# Patient Record
Sex: Female | Born: 1935 | Race: White | Hispanic: No | Smoking: Former smoker
Health system: Southern US, Community
[De-identification: ages and names within clinical notes are randomized; demographics above are authoritative.]

## PROBLEM LIST (undated history)

## (undated) DIAGNOSIS — T8859XA Other complications of anesthesia, initial encounter: Secondary | ICD-10-CM

## (undated) DIAGNOSIS — K635 Polyp of colon: Secondary | ICD-10-CM

## (undated) DIAGNOSIS — F32A Depression, unspecified: Secondary | ICD-10-CM

## (undated) DIAGNOSIS — I2510 Atherosclerotic heart disease of native coronary artery without angina pectoris: Secondary | ICD-10-CM

## (undated) DIAGNOSIS — R42 Dizziness and giddiness: Secondary | ICD-10-CM

## (undated) DIAGNOSIS — C3490 Malignant neoplasm of unspecified part of unspecified bronchus or lung: Secondary | ICD-10-CM

## (undated) DIAGNOSIS — I739 Peripheral vascular disease, unspecified: Secondary | ICD-10-CM

## (undated) DIAGNOSIS — K5900 Constipation, unspecified: Secondary | ICD-10-CM

## (undated) DIAGNOSIS — D649 Anemia, unspecified: Secondary | ICD-10-CM

## (undated) DIAGNOSIS — I10 Essential (primary) hypertension: Secondary | ICD-10-CM

## (undated) DIAGNOSIS — E785 Hyperlipidemia, unspecified: Secondary | ICD-10-CM

## (undated) DIAGNOSIS — I499 Cardiac arrhythmia, unspecified: Secondary | ICD-10-CM

## (undated) DIAGNOSIS — E119 Type 2 diabetes mellitus without complications: Secondary | ICD-10-CM

## (undated) DIAGNOSIS — E7800 Pure hypercholesterolemia, unspecified: Secondary | ICD-10-CM

## (undated) DIAGNOSIS — M1990 Unspecified osteoarthritis, unspecified site: Secondary | ICD-10-CM

## (undated) DIAGNOSIS — C439 Malignant melanoma of skin, unspecified: Secondary | ICD-10-CM

## (undated) DIAGNOSIS — R0602 Shortness of breath: Secondary | ICD-10-CM

## (undated) DIAGNOSIS — I480 Paroxysmal atrial fibrillation: Secondary | ICD-10-CM

## (undated) DIAGNOSIS — L98469 Non-pressure chronic ulcer of face with unspecified severity: Secondary | ICD-10-CM

## (undated) DIAGNOSIS — C50919 Malignant neoplasm of unspecified site of unspecified female breast: Secondary | ICD-10-CM

## (undated) DIAGNOSIS — Z972 Presence of dental prosthetic device (complete) (partial): Secondary | ICD-10-CM

## (undated) DIAGNOSIS — Z974 Presence of external hearing-aid: Secondary | ICD-10-CM

## (undated) DIAGNOSIS — I509 Heart failure, unspecified: Secondary | ICD-10-CM

## (undated) DIAGNOSIS — K219 Gastro-esophageal reflux disease without esophagitis: Secondary | ICD-10-CM

## (undated) DIAGNOSIS — R509 Fever, unspecified: Secondary | ICD-10-CM

## (undated) DIAGNOSIS — R55 Syncope and collapse: Secondary | ICD-10-CM

## (undated) DIAGNOSIS — M21379 Foot drop, unspecified foot: Secondary | ICD-10-CM

## (undated) DIAGNOSIS — L98499 Non-pressure chronic ulcer of skin of other sites with unspecified severity: Secondary | ICD-10-CM

## (undated) DIAGNOSIS — I219 Acute myocardial infarction, unspecified: Secondary | ICD-10-CM

## (undated) HISTORY — DX: Other complications of anesthesia, initial encounter: T88.59XA

## (undated) HISTORY — DX: Peripheral vascular disease, unspecified: I73.9

## (undated) HISTORY — PX: CATARACT EXTRACTION: SUR2

## (undated) HISTORY — DX: Depression, unspecified: F32.A

## (undated) HISTORY — PX: CARDIAC SURGERY: SHX584

## (undated) HISTORY — DX: Type 2 diabetes mellitus without complications: E11.9

## (undated) HISTORY — DX: Hyperlipidemia, unspecified: E78.5

## (undated) HISTORY — DX: Dizziness and giddiness: R42

## (undated) HISTORY — DX: Polyp of colon: K63.5

## (undated) HISTORY — DX: Malignant melanoma of skin, unspecified: C43.9

## (undated) HISTORY — DX: Non-pressure chronic ulcer of face with unspecified severity: L98.469

## (undated) HISTORY — DX: Foot drop, unspecified foot: M21.379

## (undated) HISTORY — DX: Non-pressure chronic ulcer of skin of other sites with unspecified severity: L98.499

## (undated) HISTORY — DX: Anemia, unspecified: D64.9

## (undated) HISTORY — DX: Presence of external hearing-aid: Z97.4

## (undated) HISTORY — DX: Gastro-esophageal reflux disease without esophagitis: K21.9

## (undated) HISTORY — DX: Fever, unspecified: R50.9

## (undated) HISTORY — DX: Heart failure, unspecified: I50.9

## (undated) HISTORY — DX: Unspecified osteoarthritis, unspecified site: M19.90

## (undated) HISTORY — DX: Constipation, unspecified: K59.00

## (undated) HISTORY — DX: Syncope and collapse: R55

## (undated) HISTORY — DX: Essential (primary) hypertension: I10

## (undated) HISTORY — DX: Atherosclerotic heart disease of native coronary artery without angina pectoris: I25.10

## (undated) HISTORY — DX: Shortness of breath: R06.02

## (undated) HISTORY — DX: Acute myocardial infarction, unspecified: I21.9

## (undated) HISTORY — DX: Presence of dental prosthetic device (complete) (partial): Z97.2

## (undated) HISTORY — PX: BREAST BIOPSY: SHX20

## (undated) HISTORY — PX: ABDOMINAL HYSTERECTOMY: SHX81

## (undated) HISTORY — PX: BREAST CYST EXCISION: SHX579

## (undated) HISTORY — DX: Paroxysmal atrial fibrillation: I48.0

## (undated) HISTORY — PX: OTHER SURGICAL HISTORY: SHX169

## (undated) HISTORY — PX: BREAST LUMPECTOMY: SHX2

## (undated) HISTORY — DX: Cardiac arrhythmia, unspecified: I49.9

---

## 2013-03-28 DIAGNOSIS — I70209 Unspecified atherosclerosis of native arteries of extremities, unspecified extremity: Secondary | ICD-10-CM | POA: Insufficient documentation

## 2013-03-28 DIAGNOSIS — I739 Peripheral vascular disease, unspecified: Secondary | ICD-10-CM | POA: Insufficient documentation

## 2013-05-09 DIAGNOSIS — E785 Hyperlipidemia, unspecified: Secondary | ICD-10-CM | POA: Insufficient documentation

## 2013-05-09 DIAGNOSIS — E119 Type 2 diabetes mellitus without complications: Secondary | ICD-10-CM | POA: Insufficient documentation

## 2013-05-09 DIAGNOSIS — I2510 Atherosclerotic heart disease of native coronary artery without angina pectoris: Secondary | ICD-10-CM | POA: Insufficient documentation

## 2013-06-06 DIAGNOSIS — R0602 Shortness of breath: Secondary | ICD-10-CM | POA: Insufficient documentation

## 2013-07-20 DIAGNOSIS — R0989 Other specified symptoms and signs involving the circulatory and respiratory systems: Secondary | ICD-10-CM | POA: Insufficient documentation

## 2013-11-22 DIAGNOSIS — E875 Hyperkalemia: Secondary | ICD-10-CM | POA: Insufficient documentation

## 2013-11-22 DIAGNOSIS — I214 Non-ST elevation (NSTEMI) myocardial infarction: Secondary | ICD-10-CM | POA: Insufficient documentation

## 2014-11-12 DIAGNOSIS — F32A Depression, unspecified: Secondary | ICD-10-CM | POA: Insufficient documentation

## 2014-11-12 DIAGNOSIS — I509 Heart failure, unspecified: Secondary | ICD-10-CM | POA: Insufficient documentation

## 2014-11-12 DIAGNOSIS — I4891 Unspecified atrial fibrillation: Secondary | ICD-10-CM | POA: Insufficient documentation

## 2014-11-12 DIAGNOSIS — C50911 Malignant neoplasm of unspecified site of right female breast: Secondary | ICD-10-CM | POA: Insufficient documentation

## 2015-02-12 DIAGNOSIS — K219 Gastro-esophageal reflux disease without esophagitis: Secondary | ICD-10-CM | POA: Insufficient documentation

## 2015-07-28 DIAGNOSIS — Z951 Presence of aortocoronary bypass graft: Secondary | ICD-10-CM | POA: Insufficient documentation

## 2015-10-07 DIAGNOSIS — E1142 Type 2 diabetes mellitus with diabetic polyneuropathy: Secondary | ICD-10-CM | POA: Insufficient documentation

## 2015-12-05 DIAGNOSIS — L03119 Cellulitis of unspecified part of limb: Secondary | ICD-10-CM | POA: Insufficient documentation

## 2015-12-05 DIAGNOSIS — L02419 Cutaneous abscess of limb, unspecified: Secondary | ICD-10-CM | POA: Insufficient documentation

## 2016-01-17 DIAGNOSIS — S81809A Unspecified open wound, unspecified lower leg, initial encounter: Secondary | ICD-10-CM | POA: Insufficient documentation

## 2016-01-31 DIAGNOSIS — M21372 Foot drop, left foot: Secondary | ICD-10-CM | POA: Insufficient documentation

## 2016-01-31 DIAGNOSIS — S81802A Unspecified open wound, left lower leg, initial encounter: Secondary | ICD-10-CM | POA: Insufficient documentation

## 2016-04-23 DIAGNOSIS — Z95828 Presence of other vascular implants and grafts: Secondary | ICD-10-CM | POA: Insufficient documentation

## 2017-04-15 DIAGNOSIS — C4359 Malignant melanoma of other part of trunk: Secondary | ICD-10-CM | POA: Insufficient documentation

## 2017-04-29 DIAGNOSIS — R911 Solitary pulmonary nodule: Secondary | ICD-10-CM | POA: Insufficient documentation

## 2017-05-18 DIAGNOSIS — C3492 Malignant neoplasm of unspecified part of left bronchus or lung: Secondary | ICD-10-CM | POA: Insufficient documentation

## 2017-09-17 DIAGNOSIS — R5383 Other fatigue: Secondary | ICD-10-CM | POA: Insufficient documentation

## 2017-09-22 DIAGNOSIS — R5382 Chronic fatigue, unspecified: Secondary | ICD-10-CM | POA: Insufficient documentation

## 2017-10-26 DIAGNOSIS — E0590 Thyrotoxicosis, unspecified without thyrotoxic crisis or storm: Secondary | ICD-10-CM | POA: Insufficient documentation

## 2017-11-11 DIAGNOSIS — D649 Anemia, unspecified: Secondary | ICD-10-CM | POA: Insufficient documentation

## 2017-11-11 DIAGNOSIS — K921 Melena: Secondary | ICD-10-CM | POA: Insufficient documentation

## 2019-06-19 DIAGNOSIS — R948 Abnormal results of function studies of other organs and systems: Secondary | ICD-10-CM | POA: Insufficient documentation

## 2020-01-01 DIAGNOSIS — M79672 Pain in left foot: Secondary | ICD-10-CM | POA: Insufficient documentation

## 2020-02-28 ENCOUNTER — Other Ambulatory Visit: Payer: Self-pay | Admitting: Geriatric Medicine

## 2020-02-28 DIAGNOSIS — Z1231 Encounter for screening mammogram for malignant neoplasm of breast: Secondary | ICD-10-CM

## 2020-03-27 ENCOUNTER — Ambulatory Visit: Payer: Self-pay

## 2020-04-16 ENCOUNTER — Ambulatory Visit: Payer: Self-pay

## 2020-04-17 ENCOUNTER — Other Ambulatory Visit: Payer: Self-pay

## 2020-04-17 ENCOUNTER — Ambulatory Visit
Admission: RE | Admit: 2020-04-17 | Discharge: 2020-04-17 | Disposition: A | Discharge status: Home / Self Care | Payer: Medicare Other | Source: Ambulatory Visit | Attending: Geriatric Medicine | Admitting: Geriatric Medicine

## 2020-04-17 DIAGNOSIS — Z1231 Encounter for screening mammogram for malignant neoplasm of breast: Secondary | ICD-10-CM

## 2020-04-17 HISTORY — DX: Malignant neoplasm of unspecified site of unspecified female breast: C50.919

## 2020-04-17 HISTORY — DX: Malignant neoplasm of unspecified part of unspecified bronchus or lung: C34.90

## 2020-10-16 ENCOUNTER — Encounter: Payer: Self-pay | Admitting: Podiatry

## 2020-10-16 ENCOUNTER — Ambulatory Visit (INDEPENDENT_AMBULATORY_CARE_PROVIDER_SITE_OTHER): Payer: Medicare Other | Admitting: Podiatry

## 2020-10-16 ENCOUNTER — Other Ambulatory Visit: Payer: Self-pay

## 2020-10-16 DIAGNOSIS — I739 Peripheral vascular disease, unspecified: Secondary | ICD-10-CM

## 2020-10-16 DIAGNOSIS — E1142 Type 2 diabetes mellitus with diabetic polyneuropathy: Secondary | ICD-10-CM | POA: Diagnosis not present

## 2020-10-16 DIAGNOSIS — B351 Tinea unguium: Secondary | ICD-10-CM | POA: Diagnosis not present

## 2020-10-16 DIAGNOSIS — M79674 Pain in right toe(s): Secondary | ICD-10-CM

## 2020-10-16 DIAGNOSIS — M21372 Foot drop, left foot: Secondary | ICD-10-CM | POA: Diagnosis not present

## 2020-10-16 DIAGNOSIS — M79675 Pain in left toe(s): Secondary | ICD-10-CM

## 2020-10-16 DIAGNOSIS — I5032 Chronic diastolic (congestive) heart failure: Secondary | ICD-10-CM | POA: Insufficient documentation

## 2020-10-16 DIAGNOSIS — I480 Paroxysmal atrial fibrillation: Secondary | ICD-10-CM | POA: Insufficient documentation

## 2020-10-16 DIAGNOSIS — E034 Atrophy of thyroid (acquired): Secondary | ICD-10-CM | POA: Insufficient documentation

## 2020-10-16 NOTE — Progress Notes (Signed)
This patient returns to my office for at risk foot care.  This patient requires this care by a professional since this patient will be at risk due to having diabetic neuropathy, drop foot left and intermittant claudication.  This patient is unable to cut nails herself since the patient cannot reach her nails.These nails are painful walking and wearing shoes.  This patient presents for at risk foot care today.  General Appearance  Alert, conversant and in no acute stress.  Vascular  Dorsalis pedis and posterior tibial  pulses are absent  bilaterally.  Capillary return is within normal limits  bilaterally. Cold feet noted  Bilaterally.  Absent digital hair  B/L.  Neurologic  Senn-Weinstein monofilament wire test within normal limits  bilaterally. Muscle power within normal limits bilaterally.  Nails Thick disfigured discolored nails with subungual debris  from hallux to fifth toes bilaterally. No evidence of bacterial infection or drainage bilaterally.  Orthopedic  No limitations of motion  feet .  No crepitus or effusions noted.  No bony pathology or digital deformities noted.  Midfoot  DJD left.  Fixed forefoot equinus position left foot.  Hammer toe 2-5 left foot.  HAV  B/L.  Skin  normotropic skin with no porokeratosis noted bilaterally.  No signs of infections or ulcers noted.     Onychomycosis  Pain in right toes  Pain in left toes  Consent was obtained for treatment procedures.   Mechanical debridement of nails 1-5  bilaterally performed with a nail nipper.  Filed with dremel without incident.    Return office visit   4 months                  Told patient to return for periodic foot care and evaluation due to potential at risk complications.   Gardiner Barefoot DPM

## 2020-11-07 ENCOUNTER — Other Ambulatory Visit: Payer: Self-pay

## 2020-11-07 ENCOUNTER — Encounter (HOSPITAL_COMMUNITY): Payer: Self-pay

## 2020-11-07 ENCOUNTER — Observation Stay (HOSPITAL_COMMUNITY)
Admission: EM | Admit: 2020-11-07 | Discharge: 2020-11-08 | Disposition: A | Discharge status: Home / Self Care | Payer: Medicare Other | Attending: Internal Medicine | Admitting: Internal Medicine

## 2020-11-07 ENCOUNTER — Observation Stay (HOSPITAL_COMMUNITY): Payer: Medicare Other

## 2020-11-07 DIAGNOSIS — Z87891 Personal history of nicotine dependence: Secondary | ICD-10-CM | POA: Insufficient documentation

## 2020-11-07 DIAGNOSIS — I2510 Atherosclerotic heart disease of native coronary artery without angina pectoris: Secondary | ICD-10-CM | POA: Diagnosis not present

## 2020-11-07 DIAGNOSIS — E119 Type 2 diabetes mellitus without complications: Secondary | ICD-10-CM | POA: Diagnosis not present

## 2020-11-07 DIAGNOSIS — E1122 Type 2 diabetes mellitus with diabetic chronic kidney disease: Secondary | ICD-10-CM | POA: Diagnosis not present

## 2020-11-07 DIAGNOSIS — R140 Abdominal distension (gaseous): Secondary | ICD-10-CM

## 2020-11-07 DIAGNOSIS — Z79899 Other long term (current) drug therapy: Secondary | ICD-10-CM | POA: Diagnosis not present

## 2020-11-07 DIAGNOSIS — N189 Chronic kidney disease, unspecified: Secondary | ICD-10-CM | POA: Diagnosis not present

## 2020-11-07 DIAGNOSIS — Z794 Long term (current) use of insulin: Secondary | ICD-10-CM | POA: Insufficient documentation

## 2020-11-07 DIAGNOSIS — Z85118 Personal history of other malignant neoplasm of bronchus and lung: Secondary | ICD-10-CM | POA: Insufficient documentation

## 2020-11-07 DIAGNOSIS — I130 Hypertensive heart and chronic kidney disease with heart failure and stage 1 through stage 4 chronic kidney disease, or unspecified chronic kidney disease: Secondary | ICD-10-CM | POA: Insufficient documentation

## 2020-11-07 DIAGNOSIS — Z951 Presence of aortocoronary bypass graft: Secondary | ICD-10-CM | POA: Diagnosis not present

## 2020-11-07 DIAGNOSIS — I5032 Chronic diastolic (congestive) heart failure: Secondary | ICD-10-CM | POA: Diagnosis present

## 2020-11-07 DIAGNOSIS — E785 Hyperlipidemia, unspecified: Secondary | ICD-10-CM | POA: Diagnosis present

## 2020-11-07 DIAGNOSIS — R2689 Other abnormalities of gait and mobility: Secondary | ICD-10-CM | POA: Diagnosis not present

## 2020-11-07 DIAGNOSIS — Z853 Personal history of malignant neoplasm of breast: Secondary | ICD-10-CM | POA: Diagnosis not present

## 2020-11-07 DIAGNOSIS — Z20822 Contact with and (suspected) exposure to covid-19: Secondary | ICD-10-CM | POA: Insufficient documentation

## 2020-11-07 DIAGNOSIS — E875 Hyperkalemia: Secondary | ICD-10-CM | POA: Diagnosis present

## 2020-11-07 DIAGNOSIS — Z7982 Long term (current) use of aspirin: Secondary | ICD-10-CM | POA: Diagnosis not present

## 2020-11-07 DIAGNOSIS — R0602 Shortness of breath: Secondary | ICD-10-CM

## 2020-11-07 DIAGNOSIS — I480 Paroxysmal atrial fibrillation: Secondary | ICD-10-CM | POA: Diagnosis present

## 2020-11-07 DIAGNOSIS — C3492 Malignant neoplasm of unspecified part of left bronchus or lung: Secondary | ICD-10-CM | POA: Diagnosis present

## 2020-11-07 DIAGNOSIS — N1832 Chronic kidney disease, stage 3b: Secondary | ICD-10-CM

## 2020-11-07 DIAGNOSIS — E876 Hypokalemia: Secondary | ICD-10-CM

## 2020-11-07 DIAGNOSIS — R7989 Other specified abnormal findings of blood chemistry: Secondary | ICD-10-CM | POA: Diagnosis present

## 2020-11-07 HISTORY — DX: Pure hypercholesterolemia, unspecified: E78.00

## 2020-11-07 HISTORY — DX: Type 2 diabetes mellitus without complications: E11.9

## 2020-11-07 HISTORY — DX: Essential (primary) hypertension: I10

## 2020-11-07 LAB — GLUCOSE, CAPILLARY
Glucose-Capillary: 111 mg/dL — ABNORMAL HIGH (ref 70–99)
Glucose-Capillary: 206 mg/dL — ABNORMAL HIGH (ref 70–99)

## 2020-11-07 LAB — MAGNESIUM: Magnesium: 1.1 mg/dL — ABNORMAL LOW (ref 1.7–2.4)

## 2020-11-07 LAB — BASIC METABOLIC PANEL
Anion gap: 13 (ref 5–15)
Anion gap: 13 (ref 5–15)
BUN: 21 mg/dL (ref 8–23)
BUN: 19 mg/dL (ref 8–23)
CO2: 29 mmol/L (ref 22–32)
CO2: 29 mmol/L (ref 22–32)
Calcium: 6.6 mg/dL — ABNORMAL LOW (ref 8.9–10.3)
Calcium: 7.4 mg/dL — ABNORMAL LOW (ref 8.9–10.3)
Chloride: 98 mmol/L (ref 98–111)
Chloride: 100 mmol/L (ref 98–111)
Creatinine, Ser: 1.29 mg/dL — ABNORMAL HIGH (ref 0.44–1.00)
Creatinine, Ser: 1.13 mg/dL — ABNORMAL HIGH (ref 0.44–1.00)
GFR, Estimated: 41 mL/min — ABNORMAL LOW (ref >60)
GFR, Estimated: 48 mL/min — ABNORMAL LOW (ref >60)
Glucose, Bld: 134 mg/dL — ABNORMAL HIGH (ref 70–99)
Glucose, Bld: 121 mg/dL — ABNORMAL HIGH (ref 70–99)
Potassium: 3.3 mmol/L — ABNORMAL LOW (ref 3.5–5.1)
Potassium: 3.3 mmol/L — ABNORMAL LOW (ref 3.5–5.1)
Sodium: 140 mmol/L (ref 135–145)
Sodium: 142 mmol/L (ref 135–145)

## 2020-11-07 LAB — CBC
HCT: 34.1 % — ABNORMAL LOW (ref 36.0–46.0)
Hemoglobin: 10.6 g/dL — ABNORMAL LOW (ref 12.0–15.0)
MCH: 30.4 pg (ref 26.0–34.0)
MCHC: 31.1 g/dL (ref 30.0–36.0)
MCV: 97.7 fL (ref 80.0–100.0)
Platelets: 283 10*3/uL (ref 150–400)
RBC: 3.49 MIL/uL — ABNORMAL LOW (ref 3.87–5.11)
RDW: 16.2 % — ABNORMAL HIGH (ref 11.5–15.5)
WBC: 8.4 10*3/uL (ref 4.0–10.5)
nRBC: 0 % (ref 0.0–0.2)

## 2020-11-07 LAB — RETICULOCYTES
Immature Retic Fract: 27.6 % — ABNORMAL HIGH (ref 2.3–15.9)
RBC.: 3.66 MIL/uL — ABNORMAL LOW (ref 3.87–5.11)
Retic Count, Absolute: 90.4 10*3/uL (ref 19.0–186.0)
Retic Ct Pct: 2.5 % (ref 0.4–3.1)

## 2020-11-07 LAB — FERRITIN: Ferritin: 35 ng/mL (ref 11–307)

## 2020-11-07 LAB — HEPATIC FUNCTION PANEL
ALT: 18 U/L (ref 0–44)
AST: 22 U/L (ref 15–41)
Albumin: 3.6 g/dL (ref 3.5–5.0)
Alkaline Phosphatase: 107 U/L (ref 38–126)
Bilirubin, Direct: 0.1 mg/dL (ref 0.0–0.2)
Indirect Bilirubin: 0.6 mg/dL (ref 0.3–0.9)
Total Bilirubin: 0.7 mg/dL (ref 0.3–1.2)
Total Protein: 7.6 g/dL (ref 6.5–8.1)

## 2020-11-07 LAB — VITAMIN D 25 HYDROXY (VIT D DEFICIENCY, FRACTURES): Vit D, 25-Hydroxy: 13.44 ng/mL — ABNORMAL LOW (ref 30–100)

## 2020-11-07 LAB — RESP PANEL BY RT-PCR (FLU A&B, COVID) ARPGX2
Influenza A by PCR: NEGATIVE
Influenza B by PCR: NEGATIVE
SARS Coronavirus 2 by RT PCR: NEGATIVE

## 2020-11-07 LAB — IRON AND TIBC
Iron: 39 ug/dL (ref 28–170)
Saturation Ratios: 12 % (ref 10.4–31.8)
TIBC: 329 ug/dL (ref 250–450)
UIBC: 290 ug/dL

## 2020-11-07 LAB — HEMOGLOBIN A1C
Hgb A1c MFr Bld: 7.5 % — ABNORMAL HIGH (ref 4.8–5.6)
Mean Plasma Glucose: 168.55 mg/dL

## 2020-11-07 LAB — PHOSPHORUS: Phosphorus: 4 mg/dL (ref 2.5–4.6)

## 2020-11-07 LAB — VITAMIN B12: Vitamin B-12: 266 pg/mL (ref 180–914)

## 2020-11-07 MED ORDER — POTASSIUM CHLORIDE CRYS ER 20 MEQ PO TBCR
20.0000 meq | EXTENDED_RELEASE_TABLET | Freq: Once | ORAL | Status: AC
  Administered 2020-11-07: 20 meq via ORAL
  Filled 2020-11-07: qty 1

## 2020-11-07 MED ORDER — SENNA 8.6 MG PO TABS
1.0000 | ORAL_TABLET | Freq: Two times a day (BID) | ORAL | Status: DC
  Filled 2020-11-07: qty 1

## 2020-11-07 MED ORDER — ASPIRIN 81 MG PO CHEW
81.0000 mg | CHEWABLE_TABLET | Freq: Every evening | ORAL | Status: DC
  Administered 2020-11-07: 81 mg via ORAL
  Filled 2020-11-07: qty 1

## 2020-11-07 MED ORDER — MAGNESIUM SULFATE 2 GM/50ML IV SOLN
2.0000 g | Freq: Once | INTRAVENOUS | Status: AC
  Administered 2020-11-07: 2 g via INTRAVENOUS
  Filled 2020-11-07: qty 50

## 2020-11-07 MED ORDER — ACETAMINOPHEN 650 MG RE SUPP: 650.0000 mg | Freq: Four times a day (QID) | RECTAL | Status: DC | PRN

## 2020-11-07 MED ORDER — VITAMIN D 25 MCG (1000 UNIT) PO TABS
1000.0000 [IU] | ORAL_TABLET | Freq: Every day | ORAL | Status: AC
  Administered 2020-11-07 – 2020-11-08 (×2): 1000 [IU] via ORAL
  Filled 2020-11-07 (×3): qty 1

## 2020-11-07 MED ORDER — POTASSIUM CHLORIDE 10 MEQ/100ML IV SOLN
10.0000 meq | INTRAVENOUS | Status: AC
  Administered 2020-11-08 (×4): 10 meq via INTRAVENOUS
  Filled 2020-11-07 (×4): qty 100

## 2020-11-07 MED ORDER — ACETAMINOPHEN 325 MG PO TABS: 650.0000 mg | ORAL_TABLET | Freq: Four times a day (QID) | ORAL | Status: DC | PRN

## 2020-11-07 MED ORDER — METOPROLOL SUCCINATE ER 50 MG PO TB24
50.0000 mg | ORAL_TABLET | Freq: Every evening | ORAL | Status: DC
  Administered 2020-11-07: 50 mg via ORAL
  Filled 2020-11-07: qty 1

## 2020-11-07 MED ORDER — VENLAFAXINE HCL ER 150 MG PO CP24
150.0000 mg | ORAL_CAPSULE | Freq: Every day | ORAL | Status: DC
  Administered 2020-11-08: 150 mg via ORAL
  Filled 2020-11-07 (×2): qty 1

## 2020-11-07 MED ORDER — TRAZODONE HCL 50 MG PO TABS
25.0000 mg | ORAL_TABLET | Freq: Every day | ORAL | Status: DC
Start: 2020-11-07 — End: 2020-11-08
  Administered 2020-11-07: 25 mg via ORAL
  Filled 2020-11-07 (×2): qty 1

## 2020-11-07 MED ORDER — POLYETHYLENE GLYCOL 3350 17 G PO PACK: 17.0000 g | PACK | Freq: Every day | ORAL | Status: DC | PRN

## 2020-11-07 MED ORDER — AMLODIPINE BESYLATE 5 MG PO TABS
5.0000 mg | ORAL_TABLET | Freq: Every evening | ORAL | Status: DC
  Administered 2020-11-07: 5 mg via ORAL
  Filled 2020-11-07: qty 1

## 2020-11-07 MED ORDER — SODIUM CHLORIDE 0.9% FLUSH: 3.0000 mL | INTRAVENOUS | Status: DC | PRN

## 2020-11-07 MED ORDER — CALCIUM GLUCONATE-NACL 2-0.675 GM/100ML-% IV SOLN
2.0000 g | Freq: Once | INTRAVENOUS | Status: AC
  Administered 2020-11-07: 2000 mg via INTRAVENOUS
  Filled 2020-11-07: qty 100

## 2020-11-07 MED ORDER — CALCIUM GLUCONATE-NACL 1-0.675 GM/50ML-% IV SOLN
1.0000 g | Freq: Once | INTRAVENOUS | Status: AC
  Administered 2020-11-08: 1000 mg via INTRAVENOUS
  Filled 2020-11-07: qty 50

## 2020-11-07 MED ORDER — SODIUM CHLORIDE 0.9% FLUSH: 3.0000 mL | Freq: Two times a day (BID) | INTRAVENOUS | Status: DC

## 2020-11-07 MED ORDER — HYDRALAZINE HCL 50 MG PO TABS
50.0000 mg | ORAL_TABLET | Freq: Three times a day (TID) | ORAL | Status: DC
  Administered 2020-11-07 – 2020-11-08 (×2): 50 mg via ORAL
  Filled 2020-11-07 (×2): qty 1

## 2020-11-07 MED ORDER — SODIUM CHLORIDE 0.9 % IV SOLN
250.0000 mL | INTRAVENOUS | Status: DC | PRN
Start: 2020-11-07 — End: 2020-11-08

## 2020-11-07 MED ORDER — INSULIN ASPART 100 UNIT/ML ~~LOC~~ SOLN
0.0000 [IU] | SUBCUTANEOUS | Status: DC
  Administered 2020-11-08: 2 [IU] via SUBCUTANEOUS
  Administered 2020-11-08: 3 [IU] via SUBCUTANEOUS
  Administered 2020-11-08: 2 [IU] via SUBCUTANEOUS
  Administered 2020-11-08: 3 [IU] via SUBCUTANEOUS
  Filled 2020-11-07: qty 0.09

## 2020-11-07 MED ORDER — PANTOPRAZOLE SODIUM 40 MG PO TBEC
40.0000 mg | DELAYED_RELEASE_TABLET | Freq: Two times a day (BID) | ORAL | Status: DC
  Administered 2020-11-07: 40 mg via ORAL
  Filled 2020-11-07 (×2): qty 1

## 2020-11-07 MED ORDER — ENOXAPARIN SODIUM 40 MG/0.4ML ~~LOC~~ SOLN
40.0000 mg | Freq: Every day | SUBCUTANEOUS | Status: DC
  Administered 2020-11-07: 40 mg via SUBCUTANEOUS
  Filled 2020-11-07 (×2): qty 0.4

## 2020-11-07 NOTE — ED Triage Notes (Addendum)
Patient states that her K+ is low, but charge nurse informed that the patient had low Calcium levels.  Patient is from [facility]

## 2020-11-07 NOTE — ED Provider Notes (Addendum)
[facility] DEPT Provider Note   CSN: [id number] Arrival date & time: 11/07/20  1334     History Chief Complaint  Patient presents with  . Abnormal Lab    Catherine Andrews is a 86 y.o. female who presents to the emergency department at the prompting of her primary care doctor, Dr. Felipa Eth, whom she saw this morning for repeat blood work. She is directed to the emergency department via telephone with concern for critically low calcium level of 6.9 mg/dL.  Additionally has borderline hypokalemia with potassium of 3.4.  Patient is asymptomatic at this time states she is feeling well and only presented to her primary care doctor for repeat blood work.  She has history of hypocalcemia in very December 2021 with calcium of 7.9 at that time.  She denies any chest pain, shortness of breath, palpitations, but endorses right leg muscle cramps earlier today.  States she is been eating and drinking normally for her.  She states she never knew her calcium was low and has not been taking any calcium supplementation.   Patient has history of hypertension, diabetes, breast and lung cancers in the past, NSTEMI with CAD, malignant melanomas of the abdomen, GERD, depression, CHF, and CKD.   HPI     Past Medical History:  Diagnosis Date  . Breast cancer ([place])    Right Breast- 20 yrs ago  . Diabetes mellitus without complication ([place])   . High cholesterol   . Hypertension   . Lung cancer [facility])     Patient Active Problem List   Diagnosis Date Noted  . 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 Paroxysmal atrial fibrillation ([place]) 10/16/2020  . Chronic diastolic congestive heart failure ([place]) 10/16/2020  . Pain due to onychomycosis of toenails of both feet 10/16/2020  . Pain in left foot 01/01/2020  . Abnormal PET scan of colon 06/19/2019  . Anemia 11/11/2017  . Black stools 11/11/2017  . Hyperthyroidism 10/26/2017  . Chronic fatigue 09/22/2017  . Lethargy  09/17/2017  . Non-small cell cancer of left lung ([place]) 05/18/2017  . Lung nodule 04/29/2017  . Malignant melanoma of torso excluding breast ([place]) 04/15/2017  . Presence of arterial stent 04/23/2016  . Left foot drop 01/31/2016  . Wound of left leg 01/31/2016  . Non-healing wound of lower extremity 01/17/2016  . Cellulitis and abscess of leg 12/05/2015  . Diabetic polyneuropathy associated with type 2 diabetes mellitus ([place]) 10/07/2015  . 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 Gastroesophageal reflux disease without esophagitis 02/12/2015  . Atrial fibrillation with RVR ([place]) 11/12/2014  . Congestive heart failure ([place]) 11/12/2014  . Depression 11/12/2014  . Malignant neoplasm of right female breast ([place]) 11/12/2014  . Hyperkalemia 11/22/2013  . NSTEMI (non-ST elevated myocardial infarction) ([place]) 11/22/2013  . Carotid bruit 07/20/2013  . Shortness of breath 06/06/2013  . Coronary artery disease 05/09/2013  . Diabetes mellitus ([place]) 05/09/2013  . Hyperlipidemia 05/09/2013  . Atherosclerosis of native artery of extremity ([place]) 03/28/2013  . Intermittent claudication ([place]) 03/28/2013    Past Surgical History:  Procedure Laterality Date  . ABDOMINAL HYSTERECTOMY    . BREAST BIOPSY    . BREAST CYST EXCISION    . BREAST LUMPECTOMY Right    20 yrs ago  . cardiac stents    . CARDIAC SURGERY       OB History   No obstetric history on file.     History reviewed. No pertinent family history.  Social History  Tobacco Use  . Smoking status: Former Research scientist (life sciences)  . Smokeless tobacco: Never Used  Vaping Use  . Vaping Use: Never used  Substance Use Topics  . Alcohol use: Never  . Drug use: Never    Home Medications Prior to Admission medications   Medication Sig Start Date End Date Taking? Authorizing Provider  acetaminophen (TYLENOL) 325 MG tablet Take 325 mg by mouth every 6 (six) hours as needed for moderate pain. 11/15/17  Yes [provider]   amLODipine (NORVASC) 5 MG tablet Take 5 mg by mouth every evening.   Yes [provider]  aspirin 81 MG chewable tablet Chew 81 mg by mouth every evening.   Yes [provider]  atorvastatin (LIPITOR) 40 MG tablet Take 40 mg by mouth daily. 08/30/14  Yes [provider]  furosemide (LASIX) 20 MG tablet Take 20 mg by mouth daily. 08/29/20  Yes [provider]  hydrALAZINE (APRESOLINE) 50 MG tablet Take 50 mg by mouth 3 (three) times daily. 09/12/20  Yes [provider]  metoprolol succinate (TOPROL-XL) 50 MG 24 hr tablet Take 50 mg by mouth every evening. 09/05/20  Yes [provider]  Multiple Vitamins-Minerals (PRESERVISION AREDS) CAPS Take 1 capsule by mouth in the morning and at bedtime.   Yes [provider]  NOVOLOG FLEXPEN 100 UNIT/ML FlexPen Inject 18-30 Units into the skin 3 (three) times daily with meals. Blood Sugar >199=18 units, >200-250=22 units, >250-300=26 units, >300=30 units 09/19/20  Yes [provider]  pantoprazole (PROTONIX) 40 MG tablet Take 40 mg by mouth 2 (two) times daily. 09/05/20  Yes [provider]  traZODone (DESYREL) 50 MG tablet Take 25 mg by mouth at bedtime. 06/06/19  Yes [provider]  TRESIBA FLEXTOUCH 100 UNIT/ML FlexTouch Pen Inject 48 Units into the skin at bedtime. 09/19/20  Yes [provider]  venlafaxine XR (EFFEXOR-XR) 150 MG 24 hr capsule Take 150 mg by mouth daily. 10/10/20  Yes [provider]  glucose blood (ACCU-CHEK AVIVA PLUS) test strip 1 strip by Miscellaneous route 3 (three) times a day before meals. 03/02/18   [provider]    Allergies    Codeine and Metformin hcl  Review of Systems   Review of Systems  Constitutional: Negative.   HENT: Negative.   Respiratory: Negative.   Cardiovascular: Negative.   Gastrointestinal: Negative.   Genitourinary: Negative.   Musculoskeletal: Negative.        Leg cramps  Skin: Negative.    Neurological: Negative.     Physical Exam Updated Vital Signs BP (!) 171/66   Pulse 79   Temp 98.2 F (36.8 C) (Oral)   Resp (!) 21   Ht 5\' 5"  (1.651 m)   Wt 79.4 kg   SpO2 (!) 89%   BMI 29.12 kg/m   Physical Exam Vitals and nursing note reviewed.  HENT:     Head: Normocephalic and atraumatic.     Nose: Nose normal.     Mouth/Throat:     Mouth: Mucous membranes are moist.     Pharynx: No oropharyngeal exudate or posterior oropharyngeal erythema.  Eyes:     General:        Right eye: No discharge.        Left eye: No discharge.     Extraocular Movements: Extraocular movements intact.     Conjunctiva/sclera: Conjunctivae normal.     Pupils: Pupils are equal, round, and reactive to light.  Cardiovascular:     Rate and Rhythm:  Normal rate and regular rhythm.     Pulses: Normal pulses.     Heart sounds: Normal heart sounds. No murmur heard.   Pulmonary:     Effort: Pulmonary effort is normal. No respiratory distress.     Breath sounds: Normal breath sounds. No wheezing or rales.  Chest:     Chest wall: No deformity, swelling, tenderness, crepitus or edema.  Abdominal:     General: Bowel sounds are normal. There is no distension.     Palpations: Abdomen is soft.     Tenderness: There is no abdominal tenderness. There is no right CVA tenderness, left CVA tenderness, guarding or rebound.  Musculoskeletal:        General: No deformity.     Cervical back: Neck supple.     Right lower leg: No edema.     Left lower leg: No edema.       Legs:  Skin:    General: Skin is warm and dry.     Capillary Refill: Capillary refill takes less than 2 seconds.  Neurological:     General: No focal deficit present.     Mental Status: She is alert and oriented to person, place, and time. Mental status is at baseline.     Cranial Nerves: Cranial nerves are intact.     Sensory: Sensation is intact.     Motor: Motor function is intact.  Psychiatric:        Mood and Affect: Mood  normal.     ED Results / Procedures / Treatments   Labs (all labs ordered are listed, but only abnormal results are displayed) Labs Reviewed  CBC - Abnormal; Notable for the following components:      Result Value   RBC 3.49 (*)    Hemoglobin 10.6 (*)    HCT 34.1 (*)    RDW 16.2 (*)    All other components within normal limits  BASIC METABOLIC PANEL - Abnormal; Notable for the following components:   Potassium 3.3 (*)    Glucose, Bld 134 (*)    Creatinine, Ser 1.29 (*)    Calcium 6.6 (*)    GFR, Estimated 41 (*)    All other components within normal limits  MAGNESIUM - Abnormal; Notable for the following components:   Magnesium 1.1 (*)    All other components within normal limits  VITAMIN D 25 HYDROXY (VIT D DEFICIENCY, FRACTURES) - Abnormal; Notable for the following components:   Vit D, 25-Hydroxy 13.44 (*)    All other components within normal limits  HEPATIC FUNCTION PANEL    EKG EKG Interpretation  Date/Time:  Thursday November 07 2020 15:58:58 EST Ventricular Rate:  80 PR Interval:    QRS Duration: 93 QT Interval:  447 QTC Calculation: 516 R Axis:   -17 Text Interpretation: Sinus rhythm Atrial premature complex Inferior infarct, old Prolonged QT interval NSR, QTc 516 Confirmed by Lavenia Atlas [phone]) on 11/07/2020 4:05:13 PM   Radiology No results found.  Procedures Procedures  Medications Ordered in ED Medications  calcium gluconate 2 g/ 100 mL sodium chloride IVPB (2,000 mg Intravenous New Bag/Given 11/07/20 1728)  cholecalciferol (VITAMIN D3) tablet 1,000 Units (has no administration in time range)  potassium chloride SA (KLOR-CON) CR tablet 20 mEq (has no administration in time range)  magnesium sulfate IVPB 2 g 50 mL (0 g Intravenous Stopped 11/07/20 1725)    ED Course  I have reviewed the triage vital signs and the nursing notes.  Pertinent labs & imaging  results that were available during my care of the patient were reviewed by me and considered in  my medical decision making (see chart for details).  Clinical Course as of 11/07/20 1811  Thu Nov 07, 2020  1740 Consult placed to cardiologist, Dr. Harrell Gave, who recommends continue repletion of her electrolytes.  Given no current arrhythmia and no cardiac symptoms, no other interventions required at this time.  She is agreeable to medicine admission.  Appreciate her collaboration in the care of this patient. [RS]    Clinical Course User Index [RS] Brailynn Breth, Sharlene Dory   MDM Rules/Calculators/A&P                          86 year old female presents with concern for low calcium of 6.9 noted at her primary care doctor.  She endorses right lower leg cramping today.  Hypertensive on intake.  Vital signs otherwise normal.  Cardiopulmonary exam is normal, abdominal exam is benign.  Patient is neurovascular intact in all 4 extremities.  Will proceed with laboratory studies and EKG.  EKG with prolonged QTC of 516, no STEMI.  CBC with anemia, hemoglobin 10.6, previously 11.  BMP with hypocalcemia of 6.6, hypokalemia of 3.3, slight AKI with creatinine 1.29, slightly higher than patient's baseline of 1.07.  Albumin normal, hypomagnesemic 1.1.  IV magnesium and calcium repletion ordered, oral potassium repletion ordered.  Consult placed to cardiology as above.  Given prolonged QTC on EKG as well as significant electrolyte derangement, feels patient would benefit from admission to the hospital this time.  Consult placed to hospitalist who requested adding on a phosphorus level.  She is agreeable to seeing this patient and admitting her to her service.  Appreciate her collaboration in the care of this patient.  Catherine Andrews voiced understanding of her medical evaluation and treatment plan.  Each of her questions was answered to her expressed satisfaction.  She is amenable to plan for admission at this time.  Final Clinical Impression(s) / ED Diagnoses Final diagnoses:  None    Rx / DC Orders ED  Discharge Orders    None       Emeline Darling, PA-C 11/07/20 1811    Matika Bartell, Gypsy Balsam, PA-C 11/07/20 1818    Lorelle Gibbs, DO 11/07/20 2310

## 2020-11-07 NOTE — ED Notes (Signed)
Patient transported to X-ray 

## 2020-11-07 NOTE — ED Notes (Signed)
Report called to Renita, RN on 6E

## 2020-11-07 NOTE — H&P (Addendum)
Catherine Andrews FAO:[id number] DOB: 04/24/1935 DOA: 11/07/2020     PCP: [name], MD   Outpatient Specialists:   CARDS:  Dr.Zidar in [place]    Patient arrived to ER on 11/07/20 at 1334 Referred by Attending Horton, Alvin Critchley, DO   Patient coming from:   From facility [facility]  Chief Complaint:   Chief Complaint  Patient presents with  . Abnormal Lab    HPI: Catherine Andrews is a 86 y.o. female with medical history significant of HTNl, CKD, non-small cell Lung Ca, Melanoma sp resection 2021, DM2, A.fib on ASA, CKD 3b, HLD, PAD, chronic diastolic CHF, CAD sp CABG    Presented with   Abnormal labs , she was told today her ca and amg was low and was to ld to go toER Reports no prior issues  Have been taking her medications Occasional constipation   Infectious risk factors:  Reports shortness of breath    Has   been vaccinated against COVID and boosted   Initial COVID TEST   in house  PCR testing  Pending  No results found for: SARSCOV2NAA   Regarding pertinent Chronic problems:   Lung CA - left upper lobe lung wedge resection on 04/26/17  S/p 5 cycles of Keytruda, last on 09/2017. 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 CT scan done on 06/13/2020 showed stable CT scan with some broad-based nodular density in the subpleural aspect of the right lower lobe unchanged  evidence of multiple pleural-based lesions,biopsy-proven adenocarcinoma noncontiguous with the original tumor. She was notfelt to be a chemotherapy or radiation candidate Her last PET scan was done on 11/21/19 revealing no evidence for hypermetabolic tumor recurrence or metastatic disease, and interval resolution of focal hypermetabolism previously localized to the proximal right colon. CT abd/pelvis in Oct '21 found no change when compared to PET from March '21.  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 Scar negative. No clinical evidence of local regional recurrence no systemic symptoms    Hyperlipidemia -  on statins lipitor Lipid Panel  No results found for: CHOL, TRIG, HDL, CHOLHDL, VLDL, LDLCALC, LDLDIRECT, LABVLDL   HTN on hydralazine, toprol    chronic CHF diastolic - Last echo noted normal LV systolic function, no regional wall motion abnormalities, EF 55-60%   on lasix   CAD  - On Aspirin, statin, betablocker,                  -  followed by cardiology                - CABG redo 2016  PAD - s/p left fem-pop bypass and prior LE stenting,    DM 2 - No results found for: HGBA1C   on insulin, PO meds only, diet controlled         A. Fib -  - CHA2DS2 vas score  7  - Off Eliquis due to recurrent falls.  AF with RVR during a hospitalization in March 2019, and was anemic from an acute GI bleed. The AF was thought to be related to dehydration.    Not on anticoagulation (isolated event)        -  Rate control:  Currently controlled with  Toprolol,      CKD stage IIIb- baseline Cr 1.2 Estimated Creatinine Clearance: 34.4  mL/min (A) (by C-G formula based on SCr of 1.29 mg/dL (H)).  Lab Results  Component Value Date   CREATININE 1.29 (H) 11/07/2020     Chronic anemia - baseline hg Hemoglobin & Hematocrit  Recent Labs    11/07/20 1432  HGB 10.6*    While in ER: hypocalcemia of 6.6, hypokalemia of 3.3, Mag 1.1   Low vit D ECG with prolonged QTC Patient was given IV magnesium and calcium as well as potassium ECG: Ordered Personally reviewed by me showing: HR : 80 Rhythm:  NSR,w PAC   no evidence of ischemic changes QTC 515   ER Provider Called:    Cardiology  Dr. Harrell Gave,  They Recommend admit to medicine replace electrolytes,  Will see in AM if needed  Hospitalist was called for admission for hypokalemia hypocalcemia hypomagnesemia  The following Work up has been ordered so far:  Orders Placed This  Encounter  Procedures  . Resp Panel by RT-PCR (Flu A&B, Covid) Nasopharyngeal Swab  . CBC  . Basic metabolic panel  . Magnesium  . VITAMIN D 25 Hydroxy (Vit-D Deficiency, Fractures)  . Hepatic function panel  . Phosphorus  . Cardiac monitoring  . Inpatient consult to Cardiology  . Consult to hospitalist  . Consult to Transition of Care  . Airborne and Contact precautions  . ED EKG      Following Medications were ordered in ER: Medications  cholecalciferol (VITAMIN D3) tablet 1,000 Units (1,000 Units Oral Given 11/07/20 1830)  magnesium sulfate IVPB 2 g 50 mL (0 g Intravenous Stopped 11/07/20 1725)  calcium gluconate 2 g/ 100 mL sodium chloride IVPB (0 g Intravenous Stopped 11/07/20 1828)  potassium chloride SA (KLOR-CON) CR tablet 20 mEq (20 mEq Oral Given 11/07/20 1830)        Consult Orders  (From admission, onward)         Start     Ordered   11/07/20 1735  Consult to hospitalist  Once       Provider:  (Not yet assigned)  Question Answer Comment  Place call to: [facility]   Reason for Consult Admit      11/07/20 1734           Significant initial  Findings: Abnormal Labs Reviewed  CBC - Abnormal; Notable for the following components:      Result Value   RBC 3.49 (*)    Hemoglobin 10.6 (*)    HCT 34.1 (*)    RDW 16.2 (*)    All other components within normal limits  BASIC METABOLIC PANEL - Abnormal; Notable for the following components:   Potassium 3.3 (*)    Glucose, Bld 134 (*)    Creatinine, Ser 1.29 (*)    Calcium 6.6 (*)    GFR, Estimated 41 (*)    All other components within normal limits  MAGNESIUM - Abnormal; Notable for the following components:   Magnesium 1.1 (*)    All other components within normal limits  VITAMIN D 25 HYDROXY (VIT D DEFICIENCY, FRACTURES) - Abnormal; Notable for the following components:   Vit D, 25-Hydroxy 13.44 (*)    All other components within normal limits    Otherwise labs showing:    Recent Labs  Lab  11/07/20 1432  NA 140  K 3.3*  CO2 29  GLUCOSE 134*  BUN 21  CREATININE 1.29*  CALCIUM 6.6*  MG 1.1*    Cr  Stable,  Lab Results  Component Value Date   CREATININE  1.29 (H) 11/07/2020    Recent Labs  Lab 11/07/20 1432  AST 22  ALT 18  ALKPHOS 107  BILITOT 0.7  PROT 7.6  ALBUMIN 3.6   Lab Results  Component Value Date   CALCIUM 6.6 (L) 11/07/2020        WBC      Component Value Date/Time   WBC 8.4 11/07/2020 1432     Plt: Lab Results  Component Value Date   PLT 283 11/07/2020     HG/HCT     Component Value Date/Time   HGB 10.6 (L) 11/07/2020 1432   HCT 34.1 (L) 11/07/2020 1432   MCV 97.7 11/07/2020 1432        UA   ordered      Ordered  KUB showing constipation  CXR - NON acute    ED Triage Vitals  Enc Vitals Group     BP 11/07/20 1341 (!) 150/66     Pulse Rate 11/07/20 1341 87     Resp 11/07/20 1341 18     Temp 11/07/20 1341 98.7 F (37.1 C)     Temp Source 11/07/20 1341 Oral     SpO2 11/07/20 1341 94 %     Weight 11/07/20 1342 175 lb (79.4 kg)     Height 11/07/20 1342 5\' 5"  (1.651 m)     Head Circumference --      Peak Flow --      Pain Score 11/07/20 1341 0     Pain Loc --      Pain Edu? --      Excl. in [place]? --   TMAX(24)@       Latest  Blood pressure (!) 156/77, pulse 83, temperature 98.2 F (36.8 C), temperature source Oral, resp. rate 19, height 5\' 5"  (1.651 m), weight 79.4 kg, SpO2 94 %.      Review of Systems:    Pertinent positives include: Constipation occasional shortness of breath dyspnea on exertion, Constitutional:  No weight loss, night sweats, Fevers, chills, fatigue, weight loss  HEENT:  No headaches, Difficulty swallowing,Tooth/dental problems,Sore throat,  No sneezing, itching, ear ache, nasal congestion, post nasal drip,  Cardio-vascular:  No chest pain, Orthopnea, PND, anasarca, dizziness, palpitations.no Bilateral lower extremity swelling  GI:  No heartburn, indigestion, abdominal pain, nausea,  vomiting, diarrhea, change in bowel habits, loss of appetite, melena, blood in stool, hematemesis Resp:   No  No excess mucus, no productive cough, No non-productive cough, No coughing up of blood.No change in color of mucus.No wheezing. Skin:  no rash or lesions. No jaundice GU:  no dysuria, change in color of urine, no urgency or frequency. No straining to urinate.  No flank pain.  Musculoskeletal:  No joint pain or no joint swelling. No decreased range of motion. No back pain.  Psych:  No change in mood or affect. No depression or anxiety. No memory loss.  Neuro: no localizing neurological complaints, no tingling, no weakness, no double vision, no gait abnormality, no slurred speech, no confusion  All systems reviewed and apart from [place] all are negative  Past Medical History:   Past Medical History:  Diagnosis Date  . Breast cancer ([place])    Right Breast- 20 yrs ago  . Diabetes mellitus without complication ([place])   . High cholesterol   . Hypertension   . Lung cancer [facility])       Past Surgical History:  Procedure Laterality Date  . ABDOMINAL HYSTERECTOMY    . BREAST BIOPSY    .  BREAST CYST EXCISION    . BREAST LUMPECTOMY Right    20 yrs ago  . cardiac stents    . CARDIAC SURGERY      Social History:  Ambulatory  wheelchair bound      reports that she has quit smoking. She has never used smokeless tobacco. She reports that she does not drink alcohol and does not use drugs.     Family History:   Family History  Problem Relation Age of Onset  . Hypertension Other     Allergies: Allergies  Allergen Reactions  . Codeine Other (See Comments)    Other reaction(s): HALLUCINATIONS Other reaction(s): HALLUCINATIONS   . Metformin Hcl     Other reaction(s): diarrhea     Prior to Admission medications   Medication Sig Start Date End Date Taking? Authorizing Provider  acetaminophen (TYLENOL) 325 MG tablet Take 325 mg by mouth every 6 (six) hours as needed for  moderate pain. 11/15/17  Yes [provider]  amLODipine (NORVASC) 5 MG tablet Take 5 mg by mouth every evening.   Yes [provider]  aspirin 81 MG chewable tablet Chew 81 mg by mouth every evening.   Yes [provider]  atorvastatin (LIPITOR) 40 MG tablet Take 40 mg by mouth daily. 08/30/14  Yes [provider]  furosemide (LASIX) 20 MG tablet Take 20 mg by mouth daily. 08/29/20  Yes [provider]  hydrALAZINE (APRESOLINE) 50 MG tablet Take 50 mg by mouth 3 (three) times daily. 09/12/20  Yes [provider]  metoprolol succinate (TOPROL-XL) 50 MG 24 hr tablet Take 50 mg by mouth every evening. 09/05/20  Yes [provider]  Multiple Vitamins-Minerals (PRESERVISION AREDS) CAPS Take 1 capsule by mouth in the morning and at bedtime.   Yes [provider]  NOVOLOG FLEXPEN 100 UNIT/ML FlexPen Inject 18-30 Units into the skin 3 (three) times daily with meals. Blood Sugar >199=18 units, >200-250=22 units, >250-300=26 units, >300=30 units 09/19/20  Yes [provider]  pantoprazole (PROTONIX) 40 MG tablet Take 40 mg by mouth 2 (two) times daily. 09/05/20  Yes [provider]  traZODone (DESYREL) 50 MG tablet Take 25 mg by mouth at bedtime. 06/06/19  Yes [provider]  TRESIBA FLEXTOUCH 100 UNIT/ML FlexTouch Pen Inject 48 Units into the skin at bedtime. 09/19/20  Yes [provider]  venlafaxine XR (EFFEXOR-XR) 150 MG 24 hr capsule Take 150 mg by mouth daily. 10/10/20  Yes [provider]  glucose blood (ACCU-CHEK AVIVA PLUS) test strip 1 strip by Miscellaneous route 3 (three) times a day before meals. 03/02/18   [provider]   Physical Exam: Vitals with BMI 11/07/2020 11/07/2020 11/07/2020  Height - - -  Weight - - -  BMI - - -  Systolic - 938 101  Diastolic - 77 66  Pulse 83 53 79     1. General:  in No  Acute distress   Chronically ill  -appearing 2. Psychological: Alert and    Oriented 3. Head/ENT:     Dry Mucous Membranes                          Head Non traumatic, neck supple                           Poor Dentition 4. SKIN:   decreased Skin turgor,  Skin clean Dry and intact no rash  5. Heart: Regular rate and rhythm no  Murmur, no Rub or gallop 6. Lungs:  no wheezes or crackles   7. Abdomen: Soft, non-tender,   distended  obese  bowel sounds present 8. Lower extremities: no clubbing, cyanosis, no  edema 9. Neurologically Grossly intact, moving all 4 extremities equally  Except for left foot drop chronic 10. MSK: Normal range of motion   All other LABS:     Recent Labs  Lab 11/07/20 1432  WBC 8.4  HGB 10.6*  HCT 34.1*  MCV 97.7  PLT 283     Recent Labs  Lab 11/07/20 1432  NA 140  K 3.3*  CL 98  CO2 29  GLUCOSE 134*  BUN 21  CREATININE 1.29*  CALCIUM 6.6*  MG 1.1*     Recent Labs  Lab 11/07/20 1432  AST 22  ALT 18  ALKPHOS 107  BILITOT 0.7  PROT 7.6  ALBUMIN 3.6       Cultures: No results found for: SDES, SPECREQUEST, CULT, REPTSTATUS   Radiological Exams on Admission: DG Chest 2 View  Result Date: 11/07/2020 CLINICAL DATA:  Abdominal distension and shortness of breath. EXAM: CHEST - 2 VIEW COMPARISON:  None. FINDINGS: A right-sided venous Port-A-Cath is seen with its distal tip noted at the junction of the superior vena cava and right atrium. Multiple sternal wires are noted. The cardiac silhouette is mildly enlarged. A calcified cyst is noted within the right breast. Degenerative changes are seen throughout the thoracic spine a chronic compression fracture deformity at the level of T12. IMPRESSION: Evidence of prior median sternotomy, without active cardiopulmonary disease. Electronically Signed   By: Virgina Norfolk M.D.   On: 11/07/2020 20:05   DG Abd 1 View  Result Date: 11/07/2020 CLINICAL DATA:  Abdominal distension EXAM: ABDOMEN - 1 VIEW COMPARISON:  None. FINDINGS: The bowel gas pattern is normal. There is a  moderate amount of colonic stool present throughout. Surgical clips are seen within the right upper quadrant. No radio-opaque calculi or other significant radiographic abnormality are seen. IMPRESSION: Moderate amount of colonic stool.  No evidence of obstruction. Electronically Signed   By: Prudencio Pair M.D.   On: 11/07/2020 20:05    Chart has been reviewed    Assessment/Plan 86 y.o. female with medical history significant of HTNl, CKD, non-small cell Lung Ca, Melanoma sp resection 2021, DM2, A.fib on ASA, CKD 3b, HLD, PAD, chronic diastolic CHF, CAD sp CABG Admitted for multiple electrolyte abnormalities  Present on Admission: . Hypocalcemia differential includes hypomagnesemia versus vitamin D deficiency.  Replace initiate vitamin D will need to continue to have this followed up and replace as needed Monitor on telemetry  . Paroxysmal atrial fibrillation (HCC) -not on anticoagulation continue Toprol.  . Non-small cell cancer of left lung ([place]) -Per records have been stable in the past Chest x-ray appears to be stable  . Hyperlipidemia -chronic stable continue Lipitor  . Hypokalemia -replace and replace magnesium level  . Coronary artery disease chronic stable continue statin aspirin and beta-blocker  . Chronic diastolic congestive heart failure (HCC)-given electrolyte abnormalities hold off on Lasix for tonight resume when able probably will need to be on some potassium supplementation as an outpatient  Constipation -will order a bowel regimen  Dm2-  - Order Sensitive SSI     -  check TSH and HgA1C  - Hold by mouth medications   Anemia -obtain anemia panel  Other plan as per orders.  DVT prophylaxis:   Lovenox  Code Status:    Code Status: Not on file  DNR/DNI  as per patient   I had personally discussed CODE STATUS with patient     Family Communication:   Family not at  Bedside    Disposition Plan:    To home once workup is complete and patient is stable    Following barriers for discharge:                            Electrolytes corrected                       Would benefit from PT/OT eval prior to DC  Ordered                                      Consults called: none  Admission status:  ED Disposition    ED Disposition Condition [place]: [place] [[id number]]  Level of Care: Telemetry [5]  Admit to tele based on following criteria: Monitor QTC interval  Covid Evaluation: Asymptomatic Screening Protocol (No Symptoms)  Diagnosis: Hypocalcemia [275.41.ICD-9-CM]  Admitting Physician: Toy Baker [[id number]]  Attending Physician: Toy Baker [[id number]]        Obs    Level of care     tele  For   24H      Lab Results  Component Value Date   [place] NEGATIVE 11/07/2020     Precautions: admitted as  Covid Negative     PPE: Used by the provider:   N95  eye Goggles,  Gloves    Sheridyn Canino 11/07/2020, 8:27 PM    [facility]     after 2 AM please page floor coverage PA If 7AM-7PM, please contact the day team taking care of the patient using [email or web address]   Patient was evaluated in the context of the global COVID-19 pandemic, which necessitated consideration that the patient might be at risk for infection with the SARS-CoV-2 virus that causes COVID-19. Institutional protocols and algorithms that pertain to the evaluation of patients at risk for COVID-19 are in a state of rapid change based on information released by regulatory bodies including the CDC and federal and state organizations. These policies and algorithms were followed during the patient's care.

## 2020-11-08 DIAGNOSIS — I2510 Atherosclerotic heart disease of native coronary artery without angina pectoris: Secondary | ICD-10-CM | POA: Diagnosis not present

## 2020-11-08 DIAGNOSIS — D519 Vitamin B12 deficiency anemia, unspecified: Secondary | ICD-10-CM

## 2020-11-08 DIAGNOSIS — Z20822 Contact with and (suspected) exposure to covid-19: Secondary | ICD-10-CM | POA: Diagnosis not present

## 2020-11-08 DIAGNOSIS — I480 Paroxysmal atrial fibrillation: Secondary | ICD-10-CM | POA: Diagnosis not present

## 2020-11-08 DIAGNOSIS — D509 Iron deficiency anemia, unspecified: Secondary | ICD-10-CM

## 2020-11-08 DIAGNOSIS — E876 Hypokalemia: Secondary | ICD-10-CM | POA: Diagnosis not present

## 2020-11-08 LAB — BASIC METABOLIC PANEL
Anion gap: 9 (ref 5–15)
BUN: 19 mg/dL (ref 8–23)
CO2: 28 mmol/L (ref 22–32)
Calcium: 7.5 mg/dL — ABNORMAL LOW (ref 8.9–10.3)
Chloride: 103 mmol/L (ref 98–111)
Creatinine, Ser: 1.09 mg/dL — ABNORMAL HIGH (ref 0.44–1.00)
GFR, Estimated: 50 mL/min — ABNORMAL LOW (ref >60)
Glucose, Bld: 242 mg/dL — ABNORMAL HIGH (ref 70–99)
Potassium: 4.5 mmol/L (ref 3.5–5.1)
Sodium: 140 mmol/L (ref 135–145)

## 2020-11-08 LAB — COMPREHENSIVE METABOLIC PANEL
ALT: 15 U/L (ref 0–44)
AST: 18 U/L (ref 15–41)
Albumin: 3.3 g/dL — ABNORMAL LOW (ref 3.5–5.0)
Alkaline Phosphatase: 87 U/L (ref 38–126)
Anion gap: 10 (ref 5–15)
BUN: 20 mg/dL (ref 8–23)
CO2: 28 mmol/L (ref 22–32)
Calcium: 7.4 mg/dL — ABNORMAL LOW (ref 8.9–10.3)
Chloride: 102 mmol/L (ref 98–111)
Creatinine, Ser: 1.05 mg/dL — ABNORMAL HIGH (ref 0.44–1.00)
GFR, Estimated: 53 mL/min — ABNORMAL LOW (ref >60)
Glucose, Bld: 191 mg/dL — ABNORMAL HIGH (ref 70–99)
Potassium: 4 mmol/L (ref 3.5–5.1)
Sodium: 140 mmol/L (ref 135–145)
Total Bilirubin: 0.6 mg/dL (ref 0.3–1.2)
Total Protein: 7 g/dL (ref 6.5–8.1)

## 2020-11-08 LAB — CBC WITH DIFFERENTIAL/PLATELET
Abs Immature Granulocytes: 0.06 10*3/uL (ref 0.00–0.07)
Basophils Absolute: 0.1 10*3/uL (ref 0.0–0.1)
Basophils Relative: 1 %
Eosinophils Absolute: 0.2 10*3/uL (ref 0.0–0.5)
Eosinophils Relative: 2 %
HCT: 32.8 % — ABNORMAL LOW (ref 36.0–46.0)
Hemoglobin: 10 g/dL — ABNORMAL LOW (ref 12.0–15.0)
Immature Granulocytes: 1 %
Lymphocytes Relative: 13 %
Lymphs Abs: 1.1 10*3/uL (ref 0.7–4.0)
MCH: 30.1 pg (ref 26.0–34.0)
MCHC: 30.5 g/dL (ref 30.0–36.0)
MCV: 98.8 fL (ref 80.0–100.0)
Monocytes Absolute: 1 10*3/uL (ref 0.1–1.0)
Monocytes Relative: 12 %
Neutro Abs: 5.7 10*3/uL (ref 1.7–7.7)
Neutrophils Relative %: 71 %
Platelets: 251 10*3/uL (ref 150–400)
RBC: 3.32 MIL/uL — ABNORMAL LOW (ref 3.87–5.11)
RDW: 16.1 % — ABNORMAL HIGH (ref 11.5–15.5)
WBC: 8 10*3/uL (ref 4.0–10.5)
nRBC: 0 % (ref 0.0–0.2)

## 2020-11-08 LAB — MAGNESIUM: Magnesium: 2 mg/dL (ref 1.7–2.4)

## 2020-11-08 LAB — TSH: TSH: 2.032 u[IU]/mL (ref 0.350–4.500)

## 2020-11-08 LAB — PHOSPHORUS: Phosphorus: 3.8 mg/dL (ref 2.5–4.6)

## 2020-11-08 LAB — GLUCOSE, CAPILLARY
Glucose-Capillary: 175 mg/dL — ABNORMAL HIGH (ref 70–99)
Glucose-Capillary: 178 mg/dL — ABNORMAL HIGH (ref 70–99)
Glucose-Capillary: 212 mg/dL — ABNORMAL HIGH (ref 70–99)

## 2020-11-08 LAB — FOLATE: Folate: 16.5 ng/mL (ref >5.9)

## 2020-11-08 MED ORDER — VITAMIN D 25 MCG (1000 UNIT) PO TABS
1000.0000 [IU] | ORAL_TABLET | Freq: Every day | ORAL | Status: DC
  Administered 2020-11-08: 1000 [IU] via ORAL
  Filled 2020-11-08: qty 1

## 2020-11-08 MED ORDER — AMLODIPINE BESYLATE 10 MG PO TABS: 10.0000 mg | ORAL_TABLET | Freq: Every evening | ORAL | 0 refills | Status: DC

## 2020-11-08 NOTE — Evaluation (Signed)
Physical Therapy Evaluation Patient Details Name: Catherine Andrews MRN: [id number] DOB: [birth year] Today's Date: 11/08/2020   History of Present Illness  Pt is 86 yo female admitted on 11/07/20 with hypocalcemia.  Pt with PMH including HTN, CKD, non-small cell Lung Ca, Melanoma sp resection 2021, DM2, A.fib on ASA, CKD 3b, HLD, PAD, chronic diastolic CHF, CAD s/p CABG.  Clinical Impression  Pt admitted with above diagnosis.  She resides at ILF and is normally independent with w/c.  Pt demonstrated safe stand pivot transfers in both directions with stable vitals.  She reports that she feels back to normal and feels safe returning home.  From PT perspective, pt is back to baseline and does not have further PT needs.      Follow Up Recommendations No PT follow up (return to ILF)    Equipment Recommendations  None recommended by PT    Recommendations for Other Services       Precautions / Restrictions Precautions Precautions: Fall Precaution Comments: latex allergy      Mobility  Bed Mobility Overal bed mobility: Modified Independent             General bed mobility comments: in chair at arrival; was Mod I with OT    Transfers Overall transfer level: Modified independent Equipment used: None             General transfer comment: Stand pivot chair to bed toward R side and from bed to chair to L side.  Did not require assist, utilized arm rest, demonstrated safely  Ambulation/Gait                Hotel manager mobility:  (Did not test w/c mobility as pt does not appear to have deficits and reports she feels at baseline.)  Modified Rankin (Stroke Patients Only)       Balance Overall balance assessment: Modified Independent Sitting-balance support: No upper extremity supported Sitting balance-Leahy Scale: Normal     Standing balance support: Single extremity supported Standing balance-Leahy Scale:  Poor Standing balance comment: Single extremity support during pivots but was steady with that support                             Pertinent Vitals/Pain Pain Assessment: No/denies pain    Home Living Family/patient expects to be discharged to:: Other (Comment) ([facility]) Living Arrangements: Alone Available Help at Discharge: Neighbor;Available PRN/intermittently Type of Home: Apartment Home Access: Elevator     Home Layout: One level Home Equipment: Grab bars - toilet;Grab bars - tub/shower;Shower seat;Wheelchair - Press photographer      Prior Function Level of Independence: Independent with assistive device(s)   Gait / Transfers Assistance Needed: Non-ambulatory due to 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  Stand pivots independently.  ADL's / Homemaking Assistance Needed: Reports independent with ADLs, light IADLs, driving, and grocery shopping  Comments: -     Hand Dominance   Dominant Hand: Right    Extremity/Trunk Assessment   Upper Extremity Assessment Upper Extremity Assessment: Overall WFL for tasks assessed    Lower Extremity Assessment Lower Extremity Assessment: LLE deficits/detail;RLE deficits/detail RLE Deficits / Details: ROM WFL: MMT 5/5 LLE Deficits / Details: ROM: hip and knee WFL, R ankle contracted in plantar flexion; MMT: knee ext 5/5  Cervical / Trunk Assessment Cervical / Trunk Assessment: Normal  Communication   Communication: No difficulties  Cognition Arousal/Alertness: Awake/alert Behavior During Therapy: WFL for tasks assessed/performed Overall Cognitive Status: Within Functional Limits for tasks assessed  Pt very pleasant, motivated, and oriented.                                       General Comments General comments (skin integrity, edema, etc.): Pt on RA with sats 93% rest and 90% after transfers.  Reports she  does get some DOE at baseline due to lung CA. Pt feels that she is at her baseline.   Exercises     Assessment/Plan    PT Assessment Patent does not need any further PT services  PT Problem List         PT Treatment Interventions      PT Goals (Current goals can be found in the Care Plan section)  Acute Rehab PT Goals Patient Stated Goal: Home before her birthday PT Goal Formulation: All assessment and education complete, DC therapy    Frequency     Barriers to discharge        Co-evaluation               AM-PAC PT "6 Clicks" Mobility  Outcome Measure Help needed turning from your back to your side while in a flat bed without using bedrails?: None Help needed moving from lying on your back to sitting on the side of a flat bed without using bedrails?: None Help needed moving to and from a bed to a chair (including a wheelchair)?: None Help needed standing up from a chair using your arms (e.g., wheelchair or bedside chair)?: None Help needed to walk in hospital room?: Total (baseline) Help needed climbing 3-5 steps with a railing? : Total (baseline) 6 Click Score: 18    End of Session   Activity Tolerance: Patient tolerated treatment well Patient left: in chair;with chair alarm set;with call bell/phone within reach Nurse Communication: Mobility status;Other (comment) (notified tech pt request wash up)      Time: 1050-1101 PT Time Calculation (min) (ACUTE ONLY): 11 min   Charges:   PT Evaluation $PT Eval Low Complexity: 1 Low          Thersia Petraglia, PT Acute Rehab Services Pager [phone] [facility] [place] 11/08/2020, 11:46 AM

## 2020-11-08 NOTE — Evaluation (Signed)
Occupational Therapy Evaluation Patient Details Name: Catherine Andrews MRN: [id number] DOB: 01/21/1935 Today's Date: 11/08/2020    History of Present Illness 86 y.o. female with PMH of HTNl, CKD, non-small cell Lung Ca, Melanoma sp resection 2021, DM2, A.fib on ASA, CKD 3b, HLD, PAD, chronic diastolic CHF, CAD sp CABG. Pt presented with   Abnormal labs low CA and mg, was instructed to go to ED   Clinical Impression   Patient lives at independent living facility, does not ambulate using manual w/c for mobility. Patient is modified independent with all self care. Patient demonstrate stand pivot to bedside commode, performed clothing and peri care management, then transfer to recliner without physical assistance. Pt also able to don socks seated at edge of bed without assist. Pt does desaturate to 85% on room air after transfers, cue for pursed lip breathing and patient quickly recovered to 92% on room air. No further acute OT needs at this time, will sign off. Please re-consult if new needs arise.    Follow Up Recommendations  No OT follow up    Equipment Recommendations  None recommended by OT       Precautions / Restrictions Precautions Precaution Comments: monitor O2 Restrictions Weight Bearing Restrictions: No      Mobility Bed Mobility Overal bed mobility: Modified Independent                  Transfers Overall transfer level: Modified independent Equipment used: None             General transfer comment: stand pivot to [facility] then to recliner chair without physical assistance    Balance Overall balance assessment: Mild deficits observed, not formally tested                                         ADL either performed or assessed with clinical judgement   ADL Overall ADL's : At baseline                                       General ADL Comments: patient demonstrates ability to perform LB dressing, clothing management, peri care  and functional transfers without physical assistance                  Pertinent Vitals/Pain Pain Assessment: No/denies pain     Hand Dominance Right   Extremity/Trunk Assessment Upper Extremity Assessment Upper Extremity Assessment: Overall WFL for tasks assessed   Lower Extremity Assessment Lower Extremity Assessment: Defer to PT evaluation       Communication Communication Communication: HOH   Cognition Arousal/Alertness: Awake/alert Behavior During Therapy: WFL for tasks assessed/performed Overall Cognitive Status: Within Functional Limits for tasks assessed                                                Home Living Family/patient expects to be discharged to:: Other (Comment) ([facility]) Living Arrangements: Alone Available Help at Discharge: Neighbor Type of Home: Apartment Home Access: Elevator     Home Layout: One level     Bathroom Shower/Tub: Occupational psychologist: Handicapped height     Home Equipment: Grab bars - toilet;Grab bars -  tub/shower;Shower seat;Wheelchair - Press photographer          Prior Functioning/Environment Level of Independence: Independent with assistive device(s)        Comments: ind with transfers to/from w/c        OT Problem List: Decreased activity tolerance         OT Goals(Current goals can be found in the care plan section) Acute Rehab OT Goals Patient Stated Goal: home OT Goal Formulation: All assessment and education complete, DC therapy   AM-PAC OT "6 Clicks" Daily Activity     Outcome Measure Help from another person eating meals?: None Help from another person taking care of personal grooming?: None Help from another person toileting, which includes using toliet, bedpan, or urinal?: None Help from another person bathing (including washing, rinsing, drying)?: None Help from another person to put on and taking off regular upper body clothing?: None Help from  another person to put on and taking off regular lower body clothing?: None 6 Click Score: 24   End of Session Nurse Communication: Mobility status  Activity Tolerance: Patient tolerated treatment well Patient left: in chair;with call bell/phone within reach;with chair alarm set  OT Visit Diagnosis: Other abnormalities of gait and mobility (R26.89)                Time: 1950-9326 OT Time Calculation (min): 17 min Charges:  OT General Charges $OT Visit: 1 Visit OT Evaluation $OT Eval Low Complexity: [place] OT OT pager: [place] 11/08/2020, 10:23 AM

## 2020-11-08 NOTE — Discharge Summary (Signed)
Physician Discharge Summary  Patient ID: Catherine Andrews MRN: [id number] DOB/AGE: 04-18-1935 86 y.o.  Admit date: 11/07/2020 Discharge date: 11/08/2020  Admission Diagnoses:  Discharge Diagnoses:  Active Problems:   Paroxysmal atrial fibrillation (HCC)   Chronic diastolic congestive heart failure (HCC)   Coronary artery disease   Diabetes mellitus (HCC)   Hyperkalemia   Hyperlipidemia   Non-small cell cancer of left lung (HCC)   Hypocalcemia   Paroxysmal A-fib (HCC)   Discharged Condition: stable  Hospital Course: Patient is an 86 year old female with past medical history significant for hypertension, chronic kidney disease stage IIIb, non-small cell lung cancer, melanoma status post resection in 2021, diabetes mellitus type 2, atrial fibrillation not anticoagulated, hyperlipidemia, peripheral artery disease, chronic diastolic congestive heart failure, coronary artery disease status post CABG.  Patient was admitted with abdominal lab results.  On presentation to the hospital, patient's potassium was 3.3, magnesium was 1.1, calcium was 6.6, albumin was 3.3 serum creatinine was 1.29 with BUN of 21, a.m. was 39, TIBC of 329 and ferritin of 35 with T sat of 12%, hemoglobin of 10.6 g/dL, folate of 16.5 and vitamin B12 was 266.  Patient was on Protonix 40 Mg p.o. twice daily prior to admission.  Patient was admitted for further assessment and management.  Abnormal electrolytes were repleted.  Protonix was held.  Has improved to 4.5, serum creatinine is down to 1.09, calcium is 7.5 with albumin of 3.3, and magnesium was 2.  Blood pressure was noted to be mildly elevated during the hospital stay.  Patient's Norvasc will be increased from 5 Mg p.o. once daily to 10 Mg p.o. once daily.  The primary care provider will continue to monitor blood pressure and electrolytes.  Patient is eager to be discharged back on.  Patient will be discharged back to the care of the primary care provider.  Continue to hold  Protonix.  Will start patient on vitamin D supplements.  Primary care provider will kindly manage vitamin B12 deficiency.  Consider starting patient on vitamin B12 therapy.  Consults: None  Significant Diagnostic Studies: See hospital course.   Discharge Exam: Blood pressure (!) 153/61, pulse 68, temperature 97.9 F (36.6 C), temperature source Oral, resp. rate 18, height 5\' 5"  (1.651 m), weight 83.6 kg, SpO2 92 %.   Disposition: Discharge disposition: 01-Home or Self Care   Discharge Instructions    Diet - low sodium heart healthy   Complete by: As directed    Increase activity slowly   Complete by: As directed      Allergies as of 11/08/2020      Reactions   Codeine Other (See Comments)   Other reaction(s): HALLUCINATIONS Other reaction(s): HALLUCINATIONS   Latex Itching   Per patient break outs   Metformin Hcl    Other reaction(s): diarrhea      Medication List    STOP taking these medications   furosemide 20 MG tablet Commonly known as: LASIX   pantoprazole 40 MG tablet Commonly known as: PROTONIX     TAKE these medications   Accu-Chek Aviva Plus test strip Generic drug: glucose blood 1 strip by Miscellaneous route 3 (three) times a day before meals.   acetaminophen 325 MG tablet Commonly known as: TYLENOL Take 325 mg by mouth every 6 (six) hours as needed for moderate pain.   amLODipine 10 MG tablet Commonly known as: NORVASC Take 1 tablet (10 mg total) by mouth every evening. What changed:   medication strength  how much to take  aspirin 81 MG chewable tablet Chew 81 mg by mouth every evening.   atorvastatin 40 MG tablet Commonly known as: LIPITOR Take 40 mg by mouth daily.   hydrALAZINE 50 MG tablet Commonly known as: APRESOLINE Take 50 mg by mouth 3 (three) times daily.   metoprolol succinate 50 MG 24 hr tablet Commonly known as: TOPROL-XL Take 50 mg by mouth every evening.   NovoLOG FlexPen 100 UNIT/ML FlexPen Generic drug: insulin  aspart Inject 18-30 Units into the skin 3 (three) times daily with meals. Blood Sugar >199=18 units, >200-250=22 units, >250-300=26 units, >300=30 units   PreserVision AREDS Caps Take 1 capsule by mouth in the morning and at bedtime.   traZODone 50 MG tablet Commonly known as: DESYREL Take 25 mg by mouth at bedtime.   Tyler Aas FlexTouch 100 UNIT/ML FlexTouch Pen Generic drug: insulin degludec Inject 48 Units into the skin at bedtime.   venlafaxine XR 150 MG 24 hr capsule Commonly known as: EFFEXOR-XR Take 150 mg by mouth daily.        SignedBonnell Public 11/08/2020, 11:17 AM

## 2020-11-08 NOTE — Progress Notes (Signed)
Inpatient Diabetes Program Recommendations  AACE/ADA: New Consensus Statement on Inpatient Glycemic Control (2015)  Target Ranges:  Prepandial:   less than 140 mg/dL      Peak postprandial:   less than 180 mg/dL (1-2 hours)      Critically ill patients:  140 - 180 mg/dL   Lab Results  Component Value Date   GLUCAP 212 (H) 11/08/2020   HGBA1C 7.5 (H) 11/07/2020    Review of Glycemic Control Results for AMANY, RANDO (MRN [id number]) as of 11/08/2020 13:20  Ref. Range 11/07/2020 23:57 11/08/2020 04:15 11/08/2020 07:43 11/08/2020 12:00  Glucose-Capillary Latest Ref Range: 70 - 99 mg/dL 206 (H) 175 (H) 178 (H) 212 (H)   Diabetes history: Type 2 DM Outpatient Diabetes medications: Tresiba 48 units QHS, Novolog 18-30 units TID Current orders for Inpatient glycemic control: Novolog 0-9 units Q4H  Inpatient Diabetes Program Recommendations:   If patient to remain inpatient:Consider adding portion of basal: Lantus 12 units QD and changing correction to Novolog 0-9 units TID & HS.   Thanks, Bronson Curb, MSN, RNC-OB Diabetes Coordinator [phone] (8a-5p)

## 2021-02-12 ENCOUNTER — Ambulatory Visit (INDEPENDENT_AMBULATORY_CARE_PROVIDER_SITE_OTHER): Payer: Medicare Other | Admitting: Podiatry

## 2021-02-12 ENCOUNTER — Encounter: Payer: Self-pay | Admitting: Podiatry

## 2021-02-12 ENCOUNTER — Other Ambulatory Visit: Payer: Self-pay

## 2021-02-12 DIAGNOSIS — B351 Tinea unguium: Secondary | ICD-10-CM | POA: Diagnosis not present

## 2021-02-12 DIAGNOSIS — E1121 Type 2 diabetes mellitus with diabetic nephropathy: Secondary | ICD-10-CM | POA: Insufficient documentation

## 2021-02-12 DIAGNOSIS — M21372 Foot drop, left foot: Secondary | ICD-10-CM | POA: Diagnosis not present

## 2021-02-12 DIAGNOSIS — E1142 Type 2 diabetes mellitus with diabetic polyneuropathy: Secondary | ICD-10-CM | POA: Diagnosis not present

## 2021-02-12 DIAGNOSIS — M79674 Pain in right toe(s): Secondary | ICD-10-CM

## 2021-02-12 DIAGNOSIS — M79675 Pain in left toe(s): Secondary | ICD-10-CM

## 2021-02-12 DIAGNOSIS — Z853 Personal history of malignant neoplasm of breast: Secondary | ICD-10-CM | POA: Insufficient documentation

## 2021-02-12 DIAGNOSIS — N1832 Chronic kidney disease, stage 3b: Secondary | ICD-10-CM | POA: Insufficient documentation

## 2021-02-12 DIAGNOSIS — I739 Peripheral vascular disease, unspecified: Secondary | ICD-10-CM | POA: Diagnosis not present

## 2021-02-12 NOTE — Progress Notes (Signed)
This patient returns to my office for at risk foot care.  This patient requires this care by a professional since this patient will be at risk due to having diabetic neuropathy, drop foot left and intermittant claudication.  This patient is unable to cut nails herself since the patient cannot reach her nails.These nails are painful walking and wearing shoes.  This patient presents for at risk foot care today.  General Appearance  Alert, conversant and in no acute stress.  Vascular  Dorsalis pedis and posterior tibial  pulses are absent  bilaterally.  Capillary return is within normal limits  bilaterally. Cold feet noted  Bilaterally.  Absent digital hair  B/L.  Neurologic  Senn-Weinstein monofilament wire test within normal limits  bilaterally. Muscle power within normal limits bilaterally.  Nails Thick disfigured discolored nails with subungual debris  from hallux to fifth toes bilaterally. No evidence of bacterial infection or drainage bilaterally.  Orthopedic  No limitations of motion  feet .  No crepitus or effusions noted.  No bony pathology or digital deformities noted.  Midfoot  DJD left.  Fixed forefoot equinus position left foot.  Hammer toe 2-5 left foot.  HAV  B/L.  Skin  normotropic skin with no porokeratosis noted bilaterally.  No signs of infections or ulcers noted.     Onychomycosis  Pain in right toes  Pain in left toes  Consent was obtained for treatment procedures.   Mechanical debridement of nails 1-5  bilaterally performed with a nail nipper.  Filed with dremel without incident.    Return office visit   4 months                  Told patient to return for periodic foot care and evaluation due to potential at risk complications.   Gardiner Barefoot DPM

## 2021-05-06 ENCOUNTER — Other Ambulatory Visit: Payer: Self-pay | Admitting: Geriatric Medicine

## 2021-05-06 DIAGNOSIS — Z1231 Encounter for screening mammogram for malignant neoplasm of breast: Secondary | ICD-10-CM

## 2021-05-21 ENCOUNTER — Ambulatory Visit (INDEPENDENT_AMBULATORY_CARE_PROVIDER_SITE_OTHER): Payer: Medicare Other | Admitting: Podiatry

## 2021-05-21 ENCOUNTER — Encounter: Payer: Self-pay | Admitting: Podiatry

## 2021-05-21 ENCOUNTER — Other Ambulatory Visit: Payer: Self-pay

## 2021-05-21 DIAGNOSIS — M21372 Foot drop, left foot: Secondary | ICD-10-CM | POA: Diagnosis not present

## 2021-05-21 DIAGNOSIS — B351 Tinea unguium: Secondary | ICD-10-CM

## 2021-05-21 DIAGNOSIS — I739 Peripheral vascular disease, unspecified: Secondary | ICD-10-CM | POA: Diagnosis not present

## 2021-05-21 DIAGNOSIS — E1142 Type 2 diabetes mellitus with diabetic polyneuropathy: Secondary | ICD-10-CM

## 2021-05-21 DIAGNOSIS — M79674 Pain in right toe(s): Secondary | ICD-10-CM

## 2021-05-21 DIAGNOSIS — M79675 Pain in left toe(s): Secondary | ICD-10-CM

## 2021-05-21 NOTE — Progress Notes (Signed)
This patient returns to my office for at risk foot care.  This patient requires this care by a professional since this patient will be at risk due to having diabetic neuropathy, drop foot left and intermittant claudication.  This patient is unable to cut nails herself since the patient cannot reach her nails.These nails are painful walking and wearing shoes.  This patient presents for at risk foot care today.  General Appearance  Alert, conversant and in no acute stress.  Vascular  Dorsalis pedis and posterior tibial  pulses are absent  bilaterally.  Capillary return is within normal limits  bilaterally. Cold feet noted  Bilaterally.  Absent digital hair  B/L.  Neurologic  Senn-Weinstein monofilament wire test within normal limits  bilaterally. Muscle power within normal limits bilaterally.  Nails Thick disfigured discolored nails with subungual debris  from hallux to fifth toes bilaterally. No evidence of bacterial infection or drainage bilaterally.  Orthopedic  No limitations of motion  feet .  No crepitus or effusions noted.  No bony pathology or digital deformities noted.  Midfoot  DJD left.  Fixed forefoot equinus position left foot.  Hammer toe 2-5 left foot.  HAV  B/L.  Skin  normotropic skin with no porokeratosis noted bilaterally.  No signs of infections or ulcers noted.     Onychomycosis  Pain in right toes  Pain in left toes  Consent was obtained for treatment procedures.   Mechanical debridement of nails 1-5  bilaterally performed with a nail nipper.  Filed with dremel without incident.   Patient desires a consult for possible foot surgery left foot.   Return office visit   3 months                  Told patient to return for periodic foot care and evaluation due to potential at risk complications.   Gardiner Barefoot DPM

## 2021-06-10 ENCOUNTER — Ambulatory Visit: Payer: Medicare Other | Admitting: Podiatry

## 2021-06-17 ENCOUNTER — Ambulatory Visit: Payer: Medicare Other

## 2021-07-15 ENCOUNTER — Other Ambulatory Visit: Payer: Self-pay

## 2021-07-15 ENCOUNTER — Ambulatory Visit
Admission: RE | Admit: 2021-07-15 | Discharge: 2021-07-15 | Disposition: A | Discharge status: Home / Self Care | Payer: Medicare Other | Source: Ambulatory Visit | Attending: Geriatric Medicine | Admitting: Geriatric Medicine

## 2021-07-15 DIAGNOSIS — Z1231 Encounter for screening mammogram for malignant neoplasm of breast: Secondary | ICD-10-CM

## 2021-08-22 ENCOUNTER — Ambulatory Visit: Payer: Medicare Other | Admitting: Podiatry

## 2021-09-19 ENCOUNTER — Ambulatory Visit (INDEPENDENT_AMBULATORY_CARE_PROVIDER_SITE_OTHER): Payer: Medicare Other | Admitting: Podiatry

## 2021-09-19 ENCOUNTER — Encounter: Payer: Self-pay | Admitting: Podiatry

## 2021-09-19 ENCOUNTER — Other Ambulatory Visit: Payer: Self-pay

## 2021-09-19 DIAGNOSIS — I739 Peripheral vascular disease, unspecified: Secondary | ICD-10-CM | POA: Diagnosis not present

## 2021-09-19 DIAGNOSIS — E1142 Type 2 diabetes mellitus with diabetic polyneuropathy: Secondary | ICD-10-CM | POA: Diagnosis not present

## 2021-09-19 DIAGNOSIS — M79675 Pain in left toe(s): Secondary | ICD-10-CM

## 2021-09-19 DIAGNOSIS — B351 Tinea unguium: Secondary | ICD-10-CM | POA: Diagnosis not present

## 2021-09-19 DIAGNOSIS — M79674 Pain in right toe(s): Secondary | ICD-10-CM

## 2021-09-19 NOTE — Progress Notes (Signed)
This patient returns to my office for at risk foot care.  This patient requires this care by a professional since this patient will be at risk due to having diabetic neuropathy, drop foot left and intermittant claudication.  This patient is unable to cut nails herself since the patient cannot reach her nails.These nails are painful walking and wearing shoes.  This patient presents for at risk foot care today.  General Appearance  Alert, conversant and in no acute stress.  Vascular  Dorsalis pedis and posterior tibial  pulses are absent  bilaterally.  Capillary return is within normal limits  bilaterally. Cold feet noted  Bilaterally.  Absent digital hair  B/L.  Neurologic  Senn-Weinstein monofilament wire test within normal limits  bilaterally. Muscle power within normal limits bilaterally.  Nails Thick disfigured discolored nails with subungual debris  from hallux to fifth toes bilaterally. No evidence of bacterial infection or drainage bilaterally.  Orthopedic  No limitations of motion  feet .  No crepitus or effusions noted.  No bony pathology or digital deformities noted.  Midfoot  DJD left.  Fixed forefoot equinus position left foot.  Hammer toe 2-5 left foot.  HAV  B/L.  Skin  normotropic skin with no porokeratosis noted bilaterally.  No signs of infections or ulcers noted.     Onychomycosis  Pain in right toes  Pain in left toes  Consent was obtained for treatment procedures.   Mechanical debridement of nails 1-5  bilaterally performed with a nail nipper.  Filed with dremel without incident.      Return office visit  prn since now podiatrist at her home.                Told patient to return for periodic foot care and evaluation due to potential at risk complications.   Gardiner Barefoot DPM

## 2021-10-23 ENCOUNTER — Ambulatory Visit (INDEPENDENT_AMBULATORY_CARE_PROVIDER_SITE_OTHER): Payer: Medicare Other | Admitting: Internal Medicine

## 2021-10-23 ENCOUNTER — Encounter: Payer: Self-pay | Admitting: Internal Medicine

## 2021-10-23 ENCOUNTER — Other Ambulatory Visit: Payer: Self-pay

## 2021-10-23 VITALS — BP 132/64 | HR 70 | Ht 64.0 in | Wt 182.0 lb

## 2021-10-23 DIAGNOSIS — Z79899 Other long term (current) drug therapy: Secondary | ICD-10-CM

## 2021-10-23 DIAGNOSIS — I480 Paroxysmal atrial fibrillation: Secondary | ICD-10-CM | POA: Diagnosis not present

## 2021-10-23 DIAGNOSIS — I4891 Unspecified atrial fibrillation: Secondary | ICD-10-CM | POA: Diagnosis not present

## 2021-10-23 DIAGNOSIS — I5032 Chronic diastolic (congestive) heart failure: Secondary | ICD-10-CM | POA: Diagnosis not present

## 2021-10-23 MED ORDER — POTASSIUM CHLORIDE ER 10 MEQ PO TBCR: EXTENDED_RELEASE_TABLET | ORAL | 3 refills | Status: DC

## 2021-10-23 MED ORDER — FUROSEMIDE 40 MG PO TABS: ORAL_TABLET | ORAL | 3 refills | Status: DC

## 2021-10-23 NOTE — Patient Instructions (Signed)
Medication Instructions:  Increase lasix to 40 mg every other day Start K 10 meq with the lasix  *If you need a refill on your cardiac medications before your next appointment, please call your pharmacy*   Lab Work: BMET, PRO BNP, CBC, LIPID AT [facility] ON Tuesday 10/28/21 If you have labs (blood work) drawn today and your tests are completely normal, you will receive your results only by: [place] (if you have MyChart) OR A paper copy in the mail If you have any lab test that is abnormal or we need to change your treatment, we will call you to review the results.   Testing/Procedures: NONE   Follow-Up: At [facility], you and your health needs are our priority.  As part of our continuing mission to provide you with exceptional heart care, we have created designated Provider Care Teams.  These Care Teams include your primary Cardiologist (physician) and Advanced Practice Providers (APPs -  Physician Assistants and Nurse Practitioners) who all work together to provide you with the care you need, when you need it.  We recommend signing up for the patient portal called "MyChart".  Sign up information is provided on this After Visit Summary.  MyChart is used to connect with patients for Virtual Visits (Telemedicine).  Patients are able to view lab/test results, encounter notes, upcoming appointments, etc.  Non-urgent messages can be sent to your provider as well.   To learn more about what you can do with MyChart, go to [email or web address].    Your next appointment:   6 month(s)  The format for your next appointment:   In Person  Provider: DR. [name]   If primary card or EP is not listed click here to update    :1}    Other Instructions

## 2021-10-23 NOTE — Progress Notes (Addendum)
Cardiology Office Note   Date:  10/23/2021   ID:  Catherine Andrews, DOB 07-05-1935, MRN [id number]  PCP:  Catherine Manes, MD  Cardiologist:   Catherine Carnes, MD   Pt presents to establish cardiac care in [facility] from [name] area   History of Present Illness: Catherine Andrews is a 87 y.o. female with a history of CAD (Hx NSTEMI 2015;  s/p CABG x 2 (2015, 2016), PAD (s/p L fem-pop bypass and LE stenting), PAF (in setting of dehydrationonly), HTN, HL, DM, lung CA (non-small cell Ca  (s/p chemo, followed in [place]) and melanoma     The pt has been followed by Catherine Andrews at [facility] to [place] and needs a new cardiologist  The pt was last seen in clinic in Jan 2022  Currently she denies CP   No dizziness    She does say that hher lasix dose was decreased due to low K and she has been more SOB since then, even at night         CATH in March 2015:   60% LM; 90% LAD, 50% RCA, 80% d RCA) Patient underwewtn PCI to LAD in 2015  Pt then underwent CABG with LIMA to LAD     In Nov 2015 cath showed; LIMA to apical LAD occluded.   Pt underwent PCI and DES x 2 to RCA.)  In Nov 2016 :Urgent redo  CABG x 3      Current Meds  Medication Sig   acetaminophen (TYLENOL) 325 MG tablet Take 325 mg by mouth every 6 (six) hours as needed for moderate pain.   amLODipine (NORVASC) 10 MG tablet Take 1 tablet (10 mg total) by mouth every evening.   aspirin 81 MG chewable tablet Chew 81 mg by mouth every evening.   atorvastatin (LIPITOR) 40 MG tablet Take 40 mg by mouth daily.   BD PEN NEEDLE NANO 2ND GEN 32G X 4 MM MISC    buPROPion (WELLBUTRIN XL) 150 MG 24 hr tablet Take 150 mg by mouth every morning.   Cholecalciferol 125 MCG (5000 UT) TABS    empagliflozin (JARDIANCE) 10 MG TABS tablet 1 tablet   famotidine (PEPCID) 20 MG tablet Take 20 mg by mouth daily.   furosemide (LASIX) 20 MG tablet 1 tablet   glucose blood (ACCU-CHEK AVIVA PLUS) test strip 1 strip by Miscellaneous route 3 (three) times a day  before meals.   hydrALAZINE (APRESOLINE) 50 MG tablet Take 50 mg by mouth 3 (three) times daily.   losartan (COZAAR) 25 MG tablet Take 25 mg by mouth daily.   Mag Threonate-Niacinamide ER (MAG-AMIDE) 500-250 MG TBCR    metoprolol succinate (TOPROL-XL) 50 MG 24 hr tablet Take 50 mg by mouth every evening.   Multiple Vitamins-Minerals (PRESERVISION AREDS) CAPS Take 1 capsule by mouth in the morning and at bedtime.   NOVOLOG FLEXPEN 100 UNIT/ML FlexPen Inject 18-30 Units into the skin 3 (three) times daily with meals. Blood Sugar >199=18 units, >200-250=22 units, >250-300=26 units, >300=30 units   omeprazole (PRILOSEC) 20 MG capsule Take 1 capsule by mouth daily.   traZODone (DESYREL) 50 MG tablet Take 25 mg by mouth at bedtime.   TRESIBA FLEXTOUCH 100 UNIT/ML FlexTouch Pen Inject 48 Units into the skin at bedtime.   venlafaxine XR (EFFEXOR-XR) 150 MG 24 hr capsule Take 150 mg by mouth daily.   [DISCONTINUED] pantoprazole (PROTONIX) 40 MG tablet 1 tablet     Allergies:  Codeine, Latex, Losartan, and Metformin hcl   Past Medical History:  Diagnosis Date   Anemia    Anesthesia complication    Arrhythmia    Arthritis    Breast cancer ([place])    Right Breast- 20 yrs ago   CAD (coronary artery disease)    CHF (congestive heart failure) (HCC)    Colon polyp    Constipation    Depression    Diabetes mellitus without complication (HCC)    DM (diabetes mellitus) (HCC)    Esophageal reflux    Fever    Foot drop    High cholesterol    HTN (hypertension)    Hyperlipemia    Hypertension    Lung cancer (HCC)    Melanoma (HCC)    MI (myocardial infarction) ([place])    Paroxysmal atrial fibrillation (HCC)    Peripheral arterial disease (HCC)    Skin ulcer of chin (HCC)    SOB (shortness of breath)    Syncope and collapse    Uses hearing aid    Vertigo    Wears partial dentures     Past Surgical History:  Procedure Laterality Date   ABDOMINAL HYSTERECTOMY     BREAST BIOPSY     BREAST  CYST EXCISION     BREAST LUMPECTOMY Right    20 yrs ago   cardiac stents     CARDIAC SURGERY     CATARACT EXTRACTION       Social History:  The patient  reports that she has quit smoking. She has never used smokeless tobacco. She reports that she does not drink alcohol and does not use drugs.   Family History:  The patient's family history includes Diabetes in her brother, father, maternal grandmother, mother, paternal grandfather, sister, and son; Heart failure in her father; Hyperlipidemia in her sister; Hypertension in an other family member; Kidney disease in her mother; Lymphoma in her brother.    ROS:  Please see the history of present illness. All other systems are reviewed and  Negative to the above problem except as noted.    PHYSICAL EXAM: VS:  Ht 5\' 4"  (1.626 m)    Wt 182 lb (82.6 kg)    BMI 31.24 kg/m   GEN: Obese 87 yo  in no acute distress  HEENT: normal  Neck: JVP increased    No  carotid bruits, Cardiac: RRR; no murmurs.  No LE  edema  Respiratory:  clear to auscultation bilaterally,  GI: soft, nontender, nondistended, + BS  No hepatomegaly  MS: L foot deformed   Moving all extremities   Skin: warm and dry, no rash Neuro:  Strength and sensation are intact Psych: euthymic mood, full affect   EKG:  EKG is ordered today.  NSR 70 bpm  LAD   LVH wih repolarization abnormality      Lipid Panel No results found for: CHOL, TRIG, HDL, CHOLHDL, VLDL, LDLCALC, LDLDIRECT    Wt Readings from Last 3 Encounters:  10/23/21 182 lb (82.6 kg)  11/07/20 184 lb 4.9 oz (83.6 kg)      ASSESSMENT AND PLAN:  1  CAD   Pt with hx CAD   NSTEMI   CABG x 2, last in 2016   No symptoms to sugg angina.     2  H HTN   BP is controlled     3  Hx systolic CHF   Last echo at [facility] LVEF was normal   Her volume is up some on exam.  I would increase lasix to 40  qod and KCL 10 qod    Will get labs next week    4  HL  LIpid panel next week with other labs    5  PAD   Denies claudication  (s/p R fem-pop bypass, s/p PTA, PCI L FA, PTA L CF, stent L SFA, bilateral iliac stenting    6  Hx adeno CA lung   Followed in [place]  7  Hx melanoma   s/p excision in 2018   (L shoulder)  8  DM  Will check A1C when gets labs   Discussed diet     ADDENDUM:   11/14/21  After review of records  Pt has persistent atrial fibrillaiot   Not on anticoag due to falls 2.  H sycope    Felt due to low glucose  Current medicines are reviewed at length with the patient today.  The patient does not have concerns regarding medicines.  Signed, Catherine Carnes, MD  10/23/2021 9:38 AM    [address], [place], [place]  [address] Phone: [phone]; Fax: [phone]

## 2021-10-30 ENCOUNTER — Telehealth: Payer: Self-pay | Admitting: *Deleted

## 2021-10-30 NOTE — Telephone Encounter (Signed)
° °  Pre-operative Risk Assessment    Patient Name: Catherine Andrews  DOB: 20-Feb-1935 MRN: [id number]      Request for Surgical Clearance    Procedure:   CYSTO BLADDER Bx WITH FULGURATION   Date of Surgery:  Clearance TBD                                 Surgeon:  DR. [facility] Surgeon's Group or Practice Name:  [facility] Phone number:  [phone] Fax number:  [phone]   Type of Clearance Requested:   - Medical  - Pharmacy:  Hold Aspirin     Type of Anesthesia:  MAC   Additional requests/questions:    Jiles Prows   10/30/2021, 12:52 PM

## 2021-10-31 NOTE — Telephone Encounter (Signed)
Kegan P. Aurich 87 year old female is requesting preoperative cardiac evaluation for cystoscopy bladder biopsy with fulguration.  She was seen in the clinic on 10/23/2021.  She was noted to be fluid volume up at that time.  Her furosemide was increased.  She denied chest pain and dizziness.   Her PMH includes atrial fibrillation with RVR, chronic diastolic CHF, coronary artery disease with history of NSTEMI in 2015 status post CABG times 10/2013, 2016, PAD, HTN, HLD, diabetes, and lung cancer.    May her aspirin be held prior to her procedure?  Please direct your response to CV DIV preop pool.  Thank you for your help.  Jossie Ng. Abrie Egloff NP-C    10/31/2021, 1:53 PM [facility] [place] [address] Office [phone] Fax [phone]

## 2021-11-03 NOTE — Telephone Encounter (Signed)
° °  Primary Cardiologist: [name], MD  Chart reviewed as part of pre-operative protocol coverage. Given past medical history and time since last visit, based on ACC/AHA guidelines, Catherine Andrews would be at acceptable risk for the planned procedure without further cardiovascular testing.   Her aspirin may be held for 5-7 days prior to her procedure.  Please resume as soon as hemostasis is achieved.  I will route this recommendation to the requesting party via Epic fax function and remove from pre-op pool.  Please call with questions.  Jossie Ng. Maitland Lesiak NP-C    11/03/2021, 10:48 AM [place] [address] Office [phone] Fax [phone]

## 2022-01-22 ENCOUNTER — Telehealth: Payer: Self-pay | Admitting: Internal Medicine

## 2022-01-22 DIAGNOSIS — I70203 Unspecified atherosclerosis of native arteries of extremities, bilateral legs: Secondary | ICD-10-CM

## 2022-01-22 DIAGNOSIS — I739 Peripheral vascular disease, unspecified: Secondary | ICD-10-CM

## 2022-01-22 NOTE — Telephone Encounter (Signed)
Patient was calling to ask Dr. Harrington Challenger which of the Vascular surgeons she would like the patient to see. The patient can not find where she wrote them down at her last office visit. Please advise

## 2022-01-22 NOTE — Telephone Encounter (Signed)
I think Drs Trula Slade and Dr Carlis Abbott are excellent   Both are at [facility]

## 2022-01-23 NOTE — Telephone Encounter (Signed)
Pt advised and she asked that I place a referral since she is no longer able to see her Vascular physician in [place] that sh had been seeing for year.   Referral placed for continuity of care.

## 2022-02-24 ENCOUNTER — Encounter (HOSPITAL_COMMUNITY): Payer: Medicare Other

## 2022-02-24 ENCOUNTER — Encounter: Payer: Medicare Other | Admitting: Vascular Surgery

## 2022-02-24 ENCOUNTER — Other Ambulatory Visit (HOSPITAL_COMMUNITY): Payer: Medicare Other

## 2022-02-27 ENCOUNTER — Other Ambulatory Visit: Payer: Self-pay | Admitting: *Deleted

## 2022-02-27 DIAGNOSIS — I70213 Atherosclerosis of native arteries of extremities with intermittent claudication, bilateral legs: Secondary | ICD-10-CM

## 2022-02-27 DIAGNOSIS — Z95828 Presence of other vascular implants and grafts: Secondary | ICD-10-CM

## 2022-03-17 ENCOUNTER — Encounter (HOSPITAL_COMMUNITY): Payer: Medicare Other

## 2022-03-17 ENCOUNTER — Encounter: Payer: Medicare Other | Admitting: Vascular Surgery

## 2022-03-17 ENCOUNTER — Other Ambulatory Visit (HOSPITAL_COMMUNITY): Payer: Medicare Other

## 2022-04-21 ENCOUNTER — Ambulatory Visit (INDEPENDENT_AMBULATORY_CARE_PROVIDER_SITE_OTHER): Payer: Medicare Other | Admitting: Vascular Surgery

## 2022-04-21 ENCOUNTER — Ambulatory Visit (HOSPITAL_COMMUNITY)
Admission: RE | Admit: 2022-04-21 | Discharge: 2022-04-21 | Disposition: A | Discharge status: Home / Self Care | Payer: Medicare Other | Source: Ambulatory Visit | Attending: Vascular Surgery | Admitting: Vascular Surgery

## 2022-04-21 ENCOUNTER — Encounter: Payer: Self-pay | Admitting: Vascular Surgery

## 2022-04-21 ENCOUNTER — Ambulatory Visit (INDEPENDENT_AMBULATORY_CARE_PROVIDER_SITE_OTHER)
Admission: RE | Admit: 2022-04-21 | Discharge: 2022-04-21 | Disposition: A | Discharge status: Home / Self Care | Payer: Medicare Other | Source: Ambulatory Visit | Attending: Vascular Surgery | Admitting: Vascular Surgery

## 2022-04-21 DIAGNOSIS — I70213 Atherosclerosis of native arteries of extremities with intermittent claudication, bilateral legs: Secondary | ICD-10-CM | POA: Insufficient documentation

## 2022-04-21 DIAGNOSIS — Z95828 Presence of other vascular implants and grafts: Secondary | ICD-10-CM | POA: Diagnosis not present

## 2022-04-21 DIAGNOSIS — I739 Peripheral vascular disease, unspecified: Secondary | ICD-10-CM | POA: Insufficient documentation

## 2022-04-21 NOTE — Progress Notes (Signed)
Patient name: Catherine Andrews MRN: [id number] DOB: 1935-08-30 Sex: female  REASON FOR CONSULT: Establish care, complex vascular surgery history  HPI: Catherine Andrews is a 87 y.o. female, with history of breast cancer, lung cancer, melanoma, diabetes, hypertension, hyperlipidemia, coronary artery disease status post CABG, atrial fibrillation, peripheral vascular disease that presents to establish care.  She previously was under the care of a vascular surgeon in [place] but unfortunately her son recently died earlier this year and she is unable to get transportation.  She has an extensive vascular surgery history including remote bilateral iliac stents and SFA stents.  She then later had a right fem to above-knee popliteal bypass with GSV in 2004.  She then had a left common femoral to above-knee popliteal bypass in 2013.  Ultimately this graft had to be removed due to infection and then she had a redo left femoral to BK pop bypass in 2017.  She had a chronic foot drop on the left.  She is having no pain in her legs today.  She is in a wheelchair most of the time.  No open wounds.  No new concerns.  She is on aspirin statin.  Does not smoke.  She was seeing her vascular surgeon in [place] about once a year and was last seen last year.  States she is able to stand and pivot.  Past Medical History:  Diagnosis Date   Anemia    Anesthesia complication    Arrhythmia    Arthritis    Breast cancer ([place])    Right Breast- 20 yrs ago   CAD (coronary artery disease)    CHF (congestive heart failure) (HCC)    Colon polyp    Constipation    Depression    Diabetes mellitus without complication ([place])    DM (diabetes mellitus) ([place])    Esophageal reflux    Fever    Foot drop    High cholesterol    HTN (hypertension)    Hyperlipemia    Hypertension    Lung cancer (H[facility])    Melanoma ([place])    MI (myocardial infarction) ([place])    Paroxysmal atrial fibrillation (HCC)    Peripheral arterial disease (HCC)     Skin ulcer of chin (HCC)    SOB (shortness of breath)    Syncope and collapse    Uses hearing aid    Vertigo    Wears partial dentures     Past Surgical History:  Procedure Laterality Date   ABDOMINAL HYSTERECTOMY     BREAST BIOPSY     BREAST CYST EXCISION     BREAST LUMPECTOMY Right    20 yrs ago   cardiac stents     CARDIAC SURGERY     CATARACT EXTRACTION      Family History  Problem Relation Age of Onset   Kidney disease Mother    Diabetes Mother    Heart failure Father    Diabetes Father    Hyperlipidemia Sister    Diabetes Sister    Diabetes Brother    Lymphoma Brother    Diabetes Maternal Grandmother    Diabetes Paternal Grandfather    Hypertension Other    Diabetes Son     SOCIAL HISTORY: Social History   Socioeconomic History   Marital status: Widowed    Spouse name: Not on file   Number of children: Not on file   Years of education: Not on file   Highest education level: Not on file  Occupational History   Not on file  Tobacco Use   Smoking status: Former   Smokeless tobacco: Never  Vaping Use   Vaping Use: Never used  Substance and Sexual Activity   Alcohol use: Never   Drug use: Never   Sexual activity: Not on file  Other Topics Concern   Not on file  Social History Narrative   Not on file   Social Determinants of Health   Financial Resource Strain: Not on file  Food Insecurity: Not on file  Transportation Needs: Not on file  Physical Activity: Not on file  Stress: Not on file  Social Connections: Not on file  Intimate Partner Violence: Not on file    Allergies  Allergen Reactions   Codeine Other (See Comments)    Other reaction(s): HALLUCINATIONS Other reaction(s): HALLUCINATIONS    Latex Itching    Per patient break outs   Losartan     Other reaction(s): decline gfr   Metformin Hcl     Other reaction(s): diarrhea    Current Outpatient Medications  Medication Sig Dispense Refill   acetaminophen (TYLENOL) 325 MG tablet  Take 325 mg by mouth every 6 (six) hours as needed for moderate pain.     amLODipine (NORVASC) 10 MG tablet Take 1 tablet (10 mg total) by mouth every evening. 30 tablet 0   aspirin 81 MG chewable tablet Chew 81 mg by mouth every evening.     atorvastatin (LIPITOR) 40 MG tablet Take 40 mg by mouth daily.     BD PEN NEEDLE NANO 2ND GEN 32G X 4 MM MISC      buPROPion (WELLBUTRIN XL) 150 MG 24 hr tablet Take 150 mg by mouth every morning.     famotidine (PEPCID) 20 MG tablet Take 20 mg by mouth daily.     furosemide (LASIX) 40 MG tablet TAKE ONE TABLET BY MOUTH EVERY OTHER DAY WITH YOUR POTASSIUM (K). 90 tablet 3   glucose blood (ACCU-CHEK AVIVA PLUS) test strip 1 strip by Miscellaneous route 3 (three) times a day before meals.     hydrALAZINE (APRESOLINE) 50 MG tablet Take 50 mg by mouth 3 (three) times daily.     losartan (COZAAR) 25 MG tablet Take 25 mg by mouth daily.     Mag Threonate-Niacinamide ER (MAG-AMIDE) 500-250 MG TBCR      metoprolol succinate (TOPROL-XL) 50 MG 24 hr tablet Take 50 mg by mouth every evening.     Multiple Vitamins-Minerals (PRESERVISION AREDS) CAPS Take 1 capsule by mouth in the morning and at bedtime.     NOVOLOG FLEXPEN 100 UNIT/ML FlexPen Inject 18-30 Units into the skin 3 (three) times daily with meals. Blood Sugar >199=18 units, >200-250=22 units, >250-300=26 units, >300=30 units     omeprazole (PRILOSEC) 20 MG capsule Take 1 capsule by mouth daily.     potassium chloride (KLOR-CON) 10 MEQ tablet TAKE ONE TABLET EVERY OTHER DAY WITH YOUR LASIX 90 tablet 3   traZODone (DESYREL) 50 MG tablet Take 25 mg by mouth at bedtime.     TRESIBA FLEXTOUCH 100 UNIT/ML FlexTouch Pen Inject 48 Units into the skin at bedtime.     venlafaxine XR (EFFEXOR-XR) 150 MG 24 hr capsule Take 150 mg by mouth daily.     Cholecalciferol 125 MCG (5000 UT) TABS  (Patient not taking: Reported on 04/21/2022)     empagliflozin (JARDIANCE) 10 MG TABS tablet 1 tablet     No current  facility-administered medications for this visit.  REVIEW OF SYSTEMS:  [X]  denotes positive finding, [ ]  denotes negative finding Cardiac  Comments:  Chest pain or chest pressure:    Shortness of breath upon exertion:    Short of breath when lying flat:    Irregular heart rhythm:        Vascular    Pain in calf, thigh, or hip brought on by ambulation:    Pain in feet at night that wakes you up from your sleep:     Blood clot in your veins:    Leg swelling:         Pulmonary    Oxygen at home:    Productive cough:     Wheezing:         Neurologic    Sudden weakness in arms or legs:     Sudden numbness in arms or legs:     Sudden onset of difficulty speaking or slurred speech:    Temporary loss of vision in one eye:     Problems with dizziness:         Gastrointestinal    Blood in stool:     Vomited blood:         Genitourinary    Burning when urinating:     Blood in urine:        Psychiatric    Major depression:         Hematologic    Bleeding problems:    Problems with blood clotting too easily:        Skin    Rashes or ulcers:        Constitutional    Fever or chills:      PHYSICAL EXAM: Vitals:   04/21/22 1410  BP: (!) 160/63  Pulse: 93  Resp: 14  Temp: (!) 97.3 F (36.3 C)  TempSrc: Temporal  SpO2: (!) 61%  Weight: 180 lb (81.6 kg)  Height: 5\' 5"  (1.651 m)    GENERAL: The patient is a well-nourished female, in no acute distress. The vital signs are documented above. CARDIAC: There is a regular rate and rhythm.  VASCULAR:  Bilateral femoral pulses palpable Bilateral DP PT signals brisk by Doppler and biphasic No lower extremity tissue loss PULMONARY: No respiratory distress. ABDOMEN: Soft and non-tender. MUSCULOSKELETAL: There are no major deformities or cyanosis. NEUROLOGIC: No focal weakness or paresthesias are detected. SKIN: There are no ulcers or rashes noted. PSYCHIATRIC: The patient has a normal affect.  DATA:   Lower  extremity arterial duplex today shows bilateral lower extremity femoropopliteal bypass is patent with no visualized high grade stenosis.  ABI's today are 0.83 on the right biphasic and 1.37 on the left biphasic  Assessment/Plan:  87 y.o. female, with history of breast cancer, lung cancer, melanoma, diabetes, hypertension, hyperlipidemia, coronary artery disease status post CABG, atrial fibrillation, peripheral vascular disease that presents to establish care.  She previously was under the care of a vascular surgeon in [place] but unfortunately her son recently died earlier this year and she is unable to get transportation.  She has an extensive vascular surgery history including remote bilateral iliac stents and SFA stents.  She then later had a right femoral to above-knee popliteal bypass in 2004.  She then had a left common femoral to above-knee popliteal bypass in 2013.  Ultimately this graft had to be removed after it got infected and then she had a redo left femoral to BK pop bypass in 2017.  She has a chronic left foot drop.  Discussed no no high grade stenosis on duplex today and bilateral lower extremity bypasses patent.  She has a chronic foot drop on the left.  She has brisk biphasic Doppler signals in her feet.  No open wounds or tissue loss.  No lower extremity complaints.  Discussed I will see her in 1 year with noninvasive imaging.  She is on aspirin statin for risk reduction.  Discussed she call with questions or concerns.  Overall looks good.   Marty Heck, MD [facility] Office: [phone]

## 2022-05-11 NOTE — Progress Notes (Signed)
Cardiology Office Note   Date:  05/12/2022   ID:  Catherine Andrews, DOB November 16, 1934, MRN [id number]  PCP:  Garwin Brothers, MD  Cardiologist:   [name], MD   Pt presents for follow up of CAD   History of Present Illness: Catherine Andrews is a 87 y.o. female with a history of CAD (Hx NSTEMI 2015;  s/p CABG x 2 (2015, 2016), PAD (s/p L fem-pop bypass and LE stenting), PAF (in setting of dehydrationonly), HTN, HL, DM, lung CA (non-small cell Ca  (s/p chemo, followed in [place]) and melanoma     The pt has been followed by Benancio Deeds at [facility] and needs a new cardiologist  Review of outside records:  CATH in March 2015:   60% LM; 90% LAD, 50% RCA, 80% d RCA) Patient underwewtn PCI to LAD in 2015  Pt then underwent CABG with LIMA to LAD     In Nov 2015 cath showed; LIMA to apical LAD occluded.   Pt underwent PCI and DES x 2 to RCA.)  In Nov 2016 :Urgent redo  CABG x 3    I saw the pt for the first time in early 2023    Since seen she denies CP   She does get SOB with doing anything   This is unchanged  from when I saw her     She denies PND  no LE edema.  No palpitations   Current Meds  Medication Sig   acetaminophen (TYLENOL) 325 MG tablet Take 325 mg by mouth every 6 (six) hours as needed for moderate pain.   amLODipine (NORVASC) 10 MG tablet Take 1 tablet (10 mg total) by mouth every evening.   aspirin 81 MG chewable tablet Chew 81 mg by mouth every evening.   atorvastatin (LIPITOR) 40 MG tablet Take 40 mg by mouth daily.   BD PEN NEEDLE NANO 2ND GEN 32G X 4 MM MISC    buPROPion (WELLBUTRIN XL) 150 MG 24 hr tablet Take 150 mg by mouth every morning.   Cholecalciferol 125 MCG (5000 UT) TABS    Cyanocobalamin (VITAMIN B 12 PO) Take 1 tablet by mouth daily.   famotidine (PEPCID) 20 MG tablet Take 20 mg by mouth daily.   furosemide (LASIX) 40 MG tablet TAKE ONE TABLET BY MOUTH EVERY OTHER DAY WITH YOUR POTASSIUM (K).   glucose blood (ACCU-CHEK AVIVA PLUS) test strip 1 strip by  Miscellaneous route 3 (three) times a day before meals.   hydrALAZINE (APRESOLINE) 50 MG tablet Take 50 mg by mouth 3 (three) times daily.   losartan (COZAAR) 25 MG tablet Take 25 mg by mouth in the morning and at bedtime.   Mag Threonate-Niacinamide ER (MAG-AMIDE) 500-250 MG TBCR    metoprolol succinate (TOPROL-XL) 50 MG 24 hr tablet Take 50 mg by mouth in the morning and at bedtime. Take with or immediately following a meal.   Multiple Vitamins-Minerals (PRESERVISION AREDS) CAPS Take 1 capsule by mouth in the morning and at bedtime.   NOVOLOG FLEXPEN 100 UNIT/ML FlexPen Inject 18-30 Units into the skin 3 (three) times daily with meals. Blood Sugar >199=18 units, >200-250=22 units, >250-300=26 units, >300=30 units   omeprazole (PRILOSEC) 20 MG capsule Take 1 capsule by mouth daily.   potassium chloride (KLOR-CON) 10 MEQ tablet TAKE ONE TABLET EVERY OTHER DAY WITH YOUR LASIX   traZODone (DESYREL) 50 MG tablet Take 25 mg by mouth at bedtime.   TRESIBA FLEXTOUCH 100  UNIT/ML FlexTouch Pen Inject 48 Units into the skin at bedtime.   venlafaxine XR (EFFEXOR-XR) 150 MG 24 hr capsule Take 150 mg by mouth daily.     Allergies:   Codeine, Latex, Losartan, and Metformin hcl   Past Medical History:  Diagnosis Date   Anemia    Anesthesia complication    Arrhythmia    Arthritis    Breast cancer ([place])    Right Breast- 20 yrs ago   CAD (coronary artery disease)    CHF (congestive heart failure) (HCC)    Colon polyp    Constipation    Depression    Diabetes mellitus without complication (HCC)    DM (diabetes mellitus) ([place])    Esophageal reflux    Fever    Foot drop    High cholesterol    HTN (hypertension)    Hyperlipemia    Hypertension    Lung cancer (HCC)    Melanoma (HCC)    MI (myocardial infarction) ([place])    Paroxysmal atrial fibrillation (HCC)    Peripheral arterial disease (HCC)    Skin ulcer of chin (HCC)    SOB (shortness of breath)    Syncope and collapse    Uses hearing  aid    Vertigo    Wears partial dentures     Past Surgical History:  Procedure Laterality Date   ABDOMINAL HYSTERECTOMY     BREAST BIOPSY     BREAST CYST EXCISION     BREAST LUMPECTOMY Right    20 yrs ago   cardiac stents     CARDIAC SURGERY     CATARACT EXTRACTION       Social History:  The patient  reports that she has quit smoking. She has never used smokeless tobacco. She reports that she does not drink alcohol and does not use drugs.   Family History:  The patient's family history includes Diabetes in her brother, father, maternal grandmother, mother, paternal grandfather, sister, and son; Heart failure in her father; Hyperlipidemia in her sister; Hypertension in an other family member; Kidney disease in her mother; Lymphoma in her brother.    ROS:  Please see the history of present illness. All other systems are reviewed and  Negative to the above problem except as noted.    PHYSICAL EXAM: VS:  BP (!) 130/48   Pulse 60   Ht 5\' 5"  (1.651 m)   Wt 184 lb 9.6 oz (83.7 kg)   SpO2 94%   BMI 30.72 kg/m   GEN: Obese 87 yo  in no acute distress  HEENT: normal  Neck: JVP is normal    Cardiac: RRR; no murmurs.  No LE  edema  Respiratory:  clear to auscultation bilaterally,  Decreased airflow     GI: soft, nontender, nondistended, + BS  No hepatomegaly  MS: L foot deformed   Moving all extremities   Skin: warm and dry, no rash Neuro:  Strength and sensation are intact Psych: euthymic mood, full affect   EKG:  EKG is ordered today.  SR 62  LVH with repoaarization abnormality    Lipid Panel No results found for: "CHOL", "TRIG", "HDL", "CHOLHDL", "VLDL", "LDLCALC", "LDLDIRECT"    Wt Readings from Last 3 Encounters:  05/12/22 184 lb 9.6 oz (83.7 kg)  04/21/22 180 lb (81.6 kg)  10/23/21 182 lb (82.6 kg)      ASSESSMENT AND PLAN:  1  CAD   Pt with hx CAD   NSTEMI   CABG x 2,  last in 2016   I am not convinced of any active symtpoms    Her SOB is probably due to  emphysema  2  HTN   BP is well controlled     3  Hx systolic CHF   Last echo at [facility] LVEF was normal   Volume looks good on current regimen     4  HL  Going to IM office   If labs done there would have them fax   Otherwise draw there   Keeep on statin    5  PAD   Denies claudication (s/p R fem-pop bypass, s/p PTA, PCI L FA, PTA L CF, stent L SFA, bilateral iliac stenting  Just seen by [facility] for follow up in 1 year     6  Hx adeno CA lung   Followed in [facility] with oncology       CT recently showed stable apppearing ground glass and nodule   Severe emphysema noted     7  Hx melanoma   s/p excision in 2018   (L shoulder)  8  DM  Watch diet     Current medicines are reviewed at length with the patient today.  The patient does not have concerns regarding medicines.  Signed, [name], MD  05/12/2022 10:37 AM    [address], [place], [place]  [address] Phone: [phone]; Fax: [phone]

## 2022-05-12 ENCOUNTER — Ambulatory Visit: Payer: Medicare Other | Attending: Internal Medicine | Admitting: Internal Medicine

## 2022-05-12 VITALS — BP 130/48 | HR 60 | Ht 65.0 in | Wt 184.6 lb

## 2022-05-12 DIAGNOSIS — I2510 Atherosclerotic heart disease of native coronary artery without angina pectoris: Secondary | ICD-10-CM | POA: Diagnosis present

## 2022-05-12 DIAGNOSIS — Z79899 Other long term (current) drug therapy: Secondary | ICD-10-CM | POA: Diagnosis present

## 2022-05-12 DIAGNOSIS — I70213 Atherosclerosis of native arteries of extremities with intermittent claudication, bilateral legs: Secondary | ICD-10-CM | POA: Diagnosis not present

## 2022-05-12 NOTE — Patient Instructions (Signed)
Medication Instructions:   *If you need a refill on your cardiac medications before your next appointment, please call your pharmacy*   Lab Work: Lipids at Dr. Carlyle Lipa office today   If you have labs (blood work) drawn today and your tests are completely normal, you will receive your results only by: [place] (if you have MyChart) OR A paper copy in the mail If you have any lab test that is abnormal or we need to change your treatment, we will call you to review the results.   Testing/Procedures:    Follow-Up: At [facility], you and your health needs are our priority.  As part of our continuing mission to provide you with exceptional heart care, we have created designated Provider Care Teams.  These Care Teams include your primary Cardiologist (physician) and Advanced Practice Providers (APPs -  Physician Assistants and Nurse Practitioners) who all work together to provide you with the care you need, when you need it.  We recommend signing up for the patient portal called "MyChart".  Sign up information is provided on this After Visit Summary.  MyChart is used to connect with patients for Virtual Visits (Telemedicine).  Patients are able to view lab/test results, encounter notes, upcoming appointments, etc.  Non-urgent messages can be sent to your provider as well.   To learn more about what you can do with MyChart, go to [email or web address].    Your next appointment:   8 month(s)  The format for your next appointment:   In Person  Provider:   [name], MD     Other Instructions   Important Information About Sugar

## 2022-06-08 ENCOUNTER — Other Ambulatory Visit: Payer: Self-pay | Admitting: Internal Medicine

## 2022-06-08 ENCOUNTER — Other Ambulatory Visit: Payer: Self-pay | Admitting: Geriatric Medicine

## 2022-06-08 DIAGNOSIS — Z1231 Encounter for screening mammogram for malignant neoplasm of breast: Secondary | ICD-10-CM

## 2022-08-13 ENCOUNTER — Ambulatory Visit
Admission: RE | Admit: 2022-08-13 | Discharge: 2022-08-13 | Disposition: A | Discharge status: Home / Self Care | Payer: Medicare Other | Source: Ambulatory Visit | Attending: Internal Medicine | Admitting: Internal Medicine

## 2022-08-13 DIAGNOSIS — Z1231 Encounter for screening mammogram for malignant neoplasm of breast: Secondary | ICD-10-CM

## 2022-09-10 ENCOUNTER — Ambulatory Visit: Payer: Medicare Other | Admitting: Internal Medicine

## 2022-10-08 ENCOUNTER — Ambulatory Visit: Payer: Medicare Other | Admitting: Internal Medicine

## 2022-10-08 ENCOUNTER — Encounter: Payer: Self-pay | Admitting: Internal Medicine

## 2022-10-08 VITALS — BP 126/78 | HR 63 | Temp 97.6°F | Resp 18 | Ht 65.0 in | Wt 180.2 lb

## 2022-10-08 DIAGNOSIS — E1121 Type 2 diabetes mellitus with diabetic nephropathy: Secondary | ICD-10-CM | POA: Diagnosis not present

## 2022-10-08 DIAGNOSIS — N183 Chronic kidney disease, stage 3 unspecified: Secondary | ICD-10-CM | POA: Diagnosis not present

## 2022-10-08 DIAGNOSIS — I10 Essential (primary) hypertension: Secondary | ICD-10-CM | POA: Diagnosis not present

## 2022-10-08 NOTE — Progress Notes (Signed)
Office Visit  Subjective   Patient ID: Catherine Andrews   DOB: Nov 20, 1934   Age: 88 y.o.   MRN: [id number]   Chief Complaint Chief Complaint  Patient presents with   Follow-up    Atrial fibrillation with RVR Hypertension Diabetes melitus     History of Present Illness The patient is a 88 year old Caucasian/White female who presents for a follow-up evaluation of hypertension.  On her last visit, her BP was not controlled and we increased her losartan from 50mg  daily to 100mg  daily.  The patient has been checking her blood pressure at home. The patient's blood pressure has ranged systollically in the 324-401'U range. The patient's current medications include: amlodipine 10mg  daily, Lasix 40mg  every other day, hydralazine 50mg  TID, and losartan 100mg  daily.  The patient has been tolerating her medications well. The patient denies any headache, visual changes, dizziness, lightheadness, chest pain, shortness of breath, weakness/numbness, and edema.   I have also reviewed her mychart and it looks like she has Stage IIIa CKD. Her creatinine has been running over the last 2 years 1-1.1 with a GFR of 48-60.   The patient is a 88 year old Caucasian/White female who returns for a follow-up visit for her T2 diabetes. She was diagnosed with diabetes about 40 years ago. Since the last visit, there have been no problems. She remains on Tresiba 42 Units daily (she was previously on 50 Units daily), novolog SSI. She is not walking as much as they would like.. She specifically denies unexplained abdominal pain, nausea or vomiting and documented hypoglycemia. She checks blood sugars with a Freestyle libre and she states her blood sugars are ranging 80-100 and that is why she cut her tresiba dose down. She came in fasting today in anticipation of lab work. She does not have any long term problems with diabetic neuropathy or retinopathy. She does have a history of diabetic with circulatory problems with PAD and CAD. It  also looks like she has some diabetic nephropathy with Stage IIIa CKD.  She has an upcoming eye exam in the next few months.       Past Medical History Past Medical History:  Diagnosis Date   Anemia    Anesthesia complication    Arthritis    Breast cancer ([place])    Right Breast- 20 yrs ago   CAD (coronary artery disease)    CHF (congestive heart failure) (HCC)    Colon polyp    Constipation    Depression    Esophageal reflux    Fever    Foot drop    High cholesterol    HTN (hypertension)    Hyperlipemia    Hypertension    Lung cancer (HCC)    Melanoma (HCC)    MI (myocardial infarction) ([place])    Paroxysmal atrial fibrillation (HCC)    Peripheral arterial disease (HCC)    Skin ulcer of chin (HCC)    SOB (shortness of breath)    Syncope and collapse    Uses hearing aid    Vertigo    Wears partial dentures      Allergies Allergies  Allergen Reactions   Codeine Other (See Comments)    Other reaction(s): HALLUCINATIONS Other reaction(s): HALLUCINATIONS    Latex Itching, Hives and Rash    Per patient break outs  adhesives   Losartan     Other reaction(s): decline gfr   Metformin Hcl     Other reaction(s): diarrhea     Medications  Current Outpatient Medications:    acetaminophen (TYLENOL) 325 MG tablet, Take 325 mg by mouth every 6 (six) hours as needed for moderate pain., Disp: , Rfl:    amLODipine (NORVASC) 10 MG tablet, Take 1 tablet (10 mg total) by mouth every evening., Disp: 30 tablet, Rfl: 0   aspirin 81 MG chewable tablet, Chew 81 mg by mouth every evening., Disp: , Rfl:    atorvastatin (LIPITOR) 40 MG tablet, Take 40 mg by mouth daily., Disp: , Rfl:    BD PEN NEEDLE NANO 2ND GEN 32G X 4 MM MISC, , Disp: , Rfl:    buPROPion (WELLBUTRIN XL) 150 MG 24 hr tablet, Take 150 mg by mouth every morning., Disp: , Rfl:    Cholecalciferol 125 MCG (5000 UT) TABS, , Disp: , Rfl:    Cyanocobalamin (VITAMIN B 12 PO), Take 1 tablet by mouth daily., Disp: , Rfl:     famotidine (PEPCID) 20 MG tablet, Take 20 mg by mouth daily., Disp: , Rfl:    FARXIGA 10 MG TABS tablet, Take 10 mg by mouth daily., Disp: , Rfl:    furosemide (LASIX) 40 MG tablet, TAKE ONE TABLET BY MOUTH EVERY OTHER DAY WITH YOUR POTASSIUM (K)., Disp: 90 tablet, Rfl: 3   glucose blood (ACCU-CHEK AVIVA PLUS) test strip, 1 strip by Miscellaneous route 3 (three) times a day before meals., Disp: , Rfl:    hydrALAZINE (APRESOLINE) 50 MG tablet, Take 50 mg by mouth 3 (three) times daily., Disp: , Rfl:    losartan (COZAAR) 100 MG tablet, Take 100 mg by mouth daily., Disp: , Rfl:    Mag Threonate-Niacinamide ER (MAG-AMIDE) 500-250 MG TBCR, , Disp: , Rfl:    metoprolol succinate (TOPROL-XL) 50 MG 24 hr tablet, Take 50 mg by mouth in the morning and at bedtime. Take with or immediately following a meal., Disp: , Rfl:    Multiple Vitamins-Minerals (PRESERVISION AREDS) CAPS, Take 1 capsule by mouth in the morning and at bedtime., Disp: , Rfl:    NOVOLOG FLEXPEN 100 UNIT/ML FlexPen, Inject 18-30 Units into the skin 3 (three) times daily with meals. Blood Sugar >199=18 units, >200-250=22 units, >250-300=26 units, >300=30 units, Disp: , Rfl:    omeprazole (PRILOSEC) 20 MG capsule, Take 1 capsule by mouth daily., Disp: , Rfl:    potassium chloride (KLOR-CON) 10 MEQ tablet, TAKE ONE TABLET EVERY OTHER DAY WITH YOUR LASIX, Disp: 90 tablet, Rfl: 3   traZODone (DESYREL) 50 MG tablet, Take 25 mg by mouth at bedtime., Disp: , Rfl:    TRESIBA FLEXTOUCH 100 UNIT/ML FlexTouch Pen, Inject 48 Units into the skin at bedtime., Disp: , Rfl:    venlafaxine XR (EFFEXOR-XR) 75 MG 24 hr capsule, Take 75 mg by mouth daily., Disp: , Rfl:    Review of Systems Review of Systems  Constitutional:  Negative for chills and fever.  Eyes:  Negative for blurred vision and double vision.  Respiratory:  Negative for cough and shortness of breath.   Cardiovascular:  Negative for chest pain, palpitations and leg swelling.   Gastrointestinal:  Negative for abdominal pain, constipation, diarrhea, nausea and vomiting.  Musculoskeletal:  Negative for myalgias.  Neurological:  Negative for dizziness, weakness and headaches.  Psychiatric/Behavioral:  Negative for depression. The patient is not nervous/anxious.        Objective:    Vitals BP 126/78 (BP Location: Left Arm, Patient Position: Sitting, Cuff Size: Normal)   Pulse 63   Temp 97.6 F (36.4 C)   Resp 18  Ht 5\' 5"  (1.651 m)   Wt 180 lb 4 oz (81.8 kg)   SpO2 96%   BMI 30.00 kg/m    Physical Examination Physical Exam Constitutional:      Appearance: Normal appearance. She is not ill-appearing.  Cardiovascular:     Rate and Rhythm: Normal rate and regular rhythm.     Pulses: Normal pulses.     Heart sounds: No murmur heard.    No friction rub. No gallop.  Pulmonary:     Effort: Pulmonary effort is normal. No respiratory distress.     Breath sounds: No wheezing, rhonchi or rales.  Abdominal:     General: Bowel sounds are normal. There is no distension.     Palpations: Abdomen is soft.     Tenderness: There is no abdominal tenderness.  Musculoskeletal:     Right lower leg: No edema.     Left lower leg: No edema.  Skin:    General: Skin is warm and dry.     Findings: No rash.  Neurological:     General: No focal deficit present.     Mental Status: She is alert and oriented to person, place, and time.  Psychiatric:        Mood and Affect: Mood normal.        Behavior: Behavior normal.        Assessment & Plan:   Essential hypertension Her BP is doing a lot better today and is controlled.  We will continue her current medications.  Diabetic Nephropathy She has diabetic nephropathy where our goal is to keep her A1c <7%.   We may need to add jardiance or farxiga but we will check her labs today including renal function.  Stage 3 chronic kidney disease ([place]) She needs to avoid nephrotoxins.   Her CKD is due to both her diabetes and  HTN.   Plan as above.    Return in about 3 months (around 01/06/2023).   Townsend Roger, MD

## 2022-10-08 NOTE — Assessment & Plan Note (Signed)
Her BP is doing a lot better today and is controlled.  We will continue her current medications.

## 2022-10-08 NOTE — Assessment & Plan Note (Signed)
She has diabetic nephropathy where our goal is to keep her A1c <7%.   We may need to add jardiance or farxiga but we will check her labs today including renal function.

## 2022-10-08 NOTE — Assessment & Plan Note (Signed)
She needs to avoid nephrotoxins.   Her CKD is due to both her diabetes and HTN.   Plan as above.

## 2022-10-09 LAB — LIPID PANEL
Chol/HDL Ratio: 2.4 ratio (ref 0.0–4.4)
Cholesterol, Total: 126 mg/dL (ref 100–199)
HDL: 53 mg/dL (ref >39)
LDL Chol Calc (NIH): 44 mg/dL (ref 0–99)
Triglycerides: 176 mg/dL — ABNORMAL HIGH (ref 0–149)
VLDL Cholesterol Cal: 29 mg/dL (ref 5–40)

## 2022-10-09 LAB — CMP14 + ANION GAP
ALT: 17 IU/L (ref 0–32)
AST: 21 IU/L (ref 0–40)
Albumin/Globulin Ratio: 1.3 (ref 1.2–2.2)
Albumin: 4.4 g/dL (ref 3.7–4.7)
Alkaline Phosphatase: 120 IU/L (ref 44–121)
Anion Gap: 21 mmol/L — ABNORMAL HIGH (ref 10.0–18.0)
BUN/Creatinine Ratio: 23 (ref 12–28)
BUN: 37 mg/dL — ABNORMAL HIGH (ref 8–27)
Bilirubin Total: 0.3 mg/dL (ref 0.0–1.2)
CO2: 21 mmol/L (ref 20–29)
Calcium: 8.8 mg/dL (ref 8.7–10.3)
Chloride: 100 mmol/L (ref 96–106)
Creatinine, Ser: 1.59 mg/dL — ABNORMAL HIGH (ref 0.57–1.00)
Globulin, Total: 3.4 g/dL (ref 1.5–4.5)
Glucose: 42 mg/dL — ABNORMAL LOW (ref 70–99)
Potassium: 4.2 mmol/L (ref 3.5–5.2)
Sodium: 142 mmol/L (ref 134–144)
Total Protein: 7.8 g/dL (ref 6.0–8.5)
eGFR: 32 mL/min/{1.73_m2} — ABNORMAL LOW (ref >59)

## 2022-10-09 LAB — HEMOGLOBIN A1C
Est. average glucose Bld gHb Est-mCnc: 143 mg/dL
Hgb A1c MFr Bld: 6.6 % — ABNORMAL HIGH (ref 4.8–5.6)

## 2022-10-22 ENCOUNTER — Other Ambulatory Visit: Payer: Self-pay | Admitting: Internal Medicine

## 2022-11-03 ENCOUNTER — Telehealth: Payer: Self-pay

## 2022-11-03 NOTE — Telephone Encounter (Signed)
-----   Message from Townsend Roger, MD sent at 11/01/2022  9:13 PM EST ----- Her labs look good.  Her diabetes is controlled.  She has CKD which is unchanged.

## 2022-11-03 NOTE — Telephone Encounter (Signed)
Pt notified of lab results

## 2022-11-16 ENCOUNTER — Other Ambulatory Visit: Payer: Self-pay | Admitting: Internal Medicine

## 2022-11-30 ENCOUNTER — Other Ambulatory Visit: Payer: Self-pay | Admitting: Internal Medicine

## 2022-12-24 ENCOUNTER — Other Ambulatory Visit: Payer: Self-pay | Admitting: Internal Medicine

## 2023-01-07 ENCOUNTER — Encounter: Payer: Self-pay | Admitting: Internal Medicine

## 2023-01-07 ENCOUNTER — Other Ambulatory Visit: Payer: Self-pay | Admitting: Internal Medicine

## 2023-01-07 ENCOUNTER — Ambulatory Visit: Payer: Medicare Other | Admitting: Internal Medicine

## 2023-01-07 VITALS — BP 134/78 | HR 67 | Temp 97.0°F | Resp 18 | Ht 65.0 in | Wt 175.0 lb

## 2023-01-07 DIAGNOSIS — N1832 Chronic kidney disease, stage 3b: Secondary | ICD-10-CM

## 2023-01-07 DIAGNOSIS — I10 Essential (primary) hypertension: Secondary | ICD-10-CM

## 2023-01-07 DIAGNOSIS — E1121 Type 2 diabetes mellitus with diabetic nephropathy: Secondary | ICD-10-CM

## 2023-01-07 NOTE — Assessment & Plan Note (Addendum)
She has diabetic nephropathy where her sugars are elevated.   I am going to increase her tresiba from 42 Units to 48 Units and she will continue the sliding scale.  We will check her HgBA1c today.

## 2023-01-07 NOTE — Assessment & Plan Note (Signed)
Her kidney function was a bit worse on her last visit.  I am going to recheck her BMP today.  She remains on losartan and farxiga.  She is to avoid NSAIDS.

## 2023-01-07 NOTE — Progress Notes (Signed)
Office Visit  Subjective   Patient ID: Catherine Andrews   DOB: 01/30/1935   Age: 88 y.o.   MRN: [id number]   Chief Complaint Chief Complaint  Patient presents with   Follow-up    3 month follow up     History of Present Illness The patient is a 88 year old Caucasian/White female who returns for a follow-up visit for her T2 diabetes. She was diagnosed with diabetes about 40 years ago. Since the last visit, there have been no problems. She remains on Tresiba 42 Units daily (she was previously on 50 Units daily) and novolog SSI.  She takes around 18 Units of novolog sliding scale when she uses it.  She is not walking as much as they would like.. She specifically denies unexplained abdominal pain, nausea or vomiting and documented hypoglycemia. She checks blood sugars with a Freestyle libre and she states her blood sugars are ranging 100-175 and that is why she cut her tresiba dose down.  Her last HgBa1c was done 3 months ago and was 6.6%.  She came in fasting today in anticipation of lab work. She does not have any long term problems with diabetic neuropathy or retinopathy. She does have a history of diabetic with circulatory problems with PAD and CAD. It also looks like she has some diabetic nephropathy with Stage IIIa CKD.  She had a dilated diabetic eye exam this morning on 01/07/2023 and there is no evidence of diabetic retinopathy.  She also has a history of Stage IIIb CKD.  creatinine has been running 1-1.1 over the last 2 years with a GFR of 32-60.  She is currently on losartan and farxiga 10mg  daily.  She denies any NSAIDS use.  he patient is a 88 year old Caucasian/White female who presents for a follow-up evaluation of hypertension.  This pas year, her BP was not controlled and we increased her losartan from 50mg  daily to 100mg  daily.  The patient has been checking her blood pressure at home. The patient's blood pressure has ranged systollically in the 120-130's range. The patient's current  medications include: amlodipine 10mg  daily, Lasix 40mg  every other day, hydralazine 50mg  TID, and losartan 100mg  daily.  The patient has been tolerating her medications well. The patient denies any headache, visual changes, dizziness, lightheadness, chest pain, shortness of breath, weakness/numbness, and edema.        Past Medical History Past Medical History:  Diagnosis Date   Anemia    Anesthesia complication    Arthritis    Breast cancer (HCC)    Right Breast- 20 yrs ago   CAD (coronary artery disease)    CHF (congestive heart failure) (HCC)    Colon polyp    Constipation    Depression    Esophageal reflux    Fever    Foot drop    High cholesterol    HTN (hypertension)    Hyperlipemia    Hypertension    Lung cancer (HCC)    Melanoma (HCC)    MI (myocardial infarction) (HCC)    Paroxysmal atrial fibrillation (HCC)    Peripheral arterial disease (HCC)    Skin ulcer of chin (HCC)    SOB (shortness of breath)    Syncope and collapse    Uses hearing aid    Vertigo    Wears partial dentures      Allergies Allergies  Allergen Reactions   Codeine Other (See Comments)    Other reaction(s): HALLUCINATIONS Other reaction(s): HALLUCINATIONS  Latex Itching, Hives and Rash    Per patient break outs  adhesives   Losartan     Other reaction(s): decline gfr   Metformin Hcl     Other reaction(s): diarrhea     Medications  Current Outpatient Medications:    acetaminophen (TYLENOL) 325 MG tablet, Take 325 mg by mouth every 6 (six) hours as needed for moderate pain., Disp: , Rfl:    amLODipine (NORVASC) 10 MG tablet, TAKE 1 TABLET BY MOUTH EVERY EVENING FOR BLOOD PRESSURE, Disp: 90 tablet, Rfl: 2   aspirin 81 MG chewable tablet, Chew 81 mg by mouth every evening., Disp: , Rfl:    atorvastatin (LIPITOR) 40 MG tablet, TAKE 1 TABLET BY MOUTH ONCE DAILY FOR CHOLESTEROL, Disp: 90 tablet, Rfl: 2   BD PEN NEEDLE NANO 2ND GEN 32G X 4 MM MISC, , Disp: , Rfl:    buPROPion  (WELLBUTRIN XL) 150 MG 24 hr tablet, Take 150 mg by mouth every morning., Disp: , Rfl:    cetirizine (ZYRTEC) 5 MG tablet, TAKE 1 TABLET BY MOUTH ONCE DAILY, Disp: 100 tablet, Rfl: 2   Cholecalciferol 125 MCG (5000 UT) TABS, , Disp: , Rfl:    Cyanocobalamin (VITAMIN B 12 PO), Take 1 tablet by mouth daily., Disp: , Rfl:    famotidine (PEPCID) 20 MG tablet, Take 20 mg by mouth daily., Disp: , Rfl:    FARXIGA 10 MG TABS tablet, TAKE 1 TABLET BY MOUTH EVERY MORNING, Disp: 30 tablet, Rfl: 10   furosemide (LASIX) 40 MG tablet, TAKE 1 TABLET BY MOUTH EVERY OTHER DAY WITH POTASSIUM, Disp: 45 tablet, Rfl: 2   glucose blood (ACCU-CHEK AVIVA PLUS) test strip, 1 strip by Miscellaneous route 3 (three) times a day before meals., Disp: , Rfl:    hydrALAZINE (APRESOLINE) 50 MG tablet, Take 50 mg by mouth 3 (three) times daily., Disp: , Rfl:    losartan (COZAAR) 100 MG tablet, Take 100 mg by mouth daily., Disp: , Rfl:    Mag Threonate-Niacinamide ER (MAG-AMIDE) 500-250 MG TBCR, , Disp: , Rfl:    metoprolol succinate (TOPROL-XL) 50 MG 24 hr tablet, Take 50 mg by mouth in the morning and at bedtime. Take with or immediately following a meal., Disp: , Rfl:    Multiple Vitamins-Minerals (PRESERVISION AREDS) CAPS, Take 1 capsule by mouth in the morning and at bedtime., Disp: , Rfl:    NOVOLOG FLEXPEN 100 UNIT/ML FlexPen, Inject 18-30 Units into the skin 3 (three) times daily with meals. Blood Sugar >199=18 units, >200-250=22 units, >250-300=26 units, >300=30 units, Disp: , Rfl:    omeprazole (PRILOSEC) 20 MG capsule, Take 1 capsule by mouth daily., Disp: , Rfl:    potassium chloride (KLOR-CON) 10 MEQ tablet, TAKE ONE TABLET EVERY OTHER DAY WITH YOUR LASIX, Disp: 90 tablet, Rfl: 3   traZODone (DESYREL) 50 MG tablet, Take 25 mg by mouth at bedtime., Disp: , Rfl:    TRESIBA FLEXTOUCH 100 UNIT/ML FlexTouch Pen, Inject 48 Units into the skin at bedtime., Disp: , Rfl:    venlafaxine XR (EFFEXOR-XR) 75 MG 24 hr capsule, TAKE 1  CAPSULE BY MOUTH ONCE DAILY *TAKE WITH FOOD* *DO NOT CRUSH OR CHEW*, Disp: 90 capsule, Rfl: 2   Review of Systems Review of Systems  Constitutional:  Negative for chills and fever.  Eyes:  Negative for blurred vision and double vision.  Respiratory:  Negative for cough and shortness of breath.   Cardiovascular:  Negative for chest pain, palpitations and leg swelling.  Gastrointestinal:  Negative for abdominal pain, constipation, diarrhea, nausea and vomiting.  Genitourinary:  Negative for frequency and hematuria.  Musculoskeletal:  Negative for myalgias.  Skin:  Negative for itching and rash.  Neurological:  Negative for dizziness, weakness and headaches.  Endo/Heme/Allergies:  Negative for polydipsia.  Psychiatric/Behavioral:  Negative for depression. The patient is not nervous/anxious.        Objective:    Vitals BP 134/78 (BP Location: Left Arm, Patient Position: Sitting, Cuff Size: Normal)   Pulse 67   Temp (!) 97 F (36.1 C)   Resp 18   Ht 5\' 5"  (1.651 m)   Wt 175 lb (79.4 kg)   SpO2 97%   BMI 29.12 kg/m    Physical Examination Physical Exam     Assessment & Plan:   Diabetic Nephropathy She has diabetic nephropathy where her sugars are elevated.   I am going to increase her tresiba from 42 Units to 48 Units and she will continue the sliding scale.  We will check her HgBA1c today.  Stage 3b chronic kidney disease (HCC) Her kidney function was a bit worse on her last visit.  I am going to recheck her BMP today.  She remains on losartan and farxiga.  She is to avoid NSAIDS.  Essential hypertension Her BP is doing well.  WE will continue her current dose of medications.    Return in about 3 months (around 04/09/2023).   Crist Fat, MD

## 2023-01-07 NOTE — Assessment & Plan Note (Signed)
Her BP is doing well.  WE will continue her current dose of medications.

## 2023-01-08 LAB — BASIC METABOLIC PANEL
BUN/Creatinine Ratio: 26 (ref 12–28)
BUN: 38 mg/dL — ABNORMAL HIGH (ref 8–27)
CO2: 16 mmol/L — ABNORMAL LOW (ref 20–29)
Calcium: 9.7 mg/dL (ref 8.7–10.3)
Chloride: 104 mmol/L (ref 96–106)
Creatinine, Ser: 1.45 mg/dL — ABNORMAL HIGH (ref 0.57–1.00)
Glucose: 135 mg/dL — ABNORMAL HIGH (ref 70–99)
Potassium: 4.8 mmol/L (ref 3.5–5.2)
Sodium: 144 mmol/L (ref 134–144)
eGFR: 35 mL/min/{1.73_m2} — ABNORMAL LOW (ref >59)

## 2023-01-08 LAB — HGB A1C W/O EAG: Hgb A1c MFr Bld: 7.5 % — ABNORMAL HIGH (ref 4.8–5.6)

## 2023-01-13 ENCOUNTER — Telehealth: Payer: Self-pay

## 2023-01-13 NOTE — Telephone Encounter (Signed)
-----   Message from Crist Fat, MD sent at 01/11/2023  4:38 PM EDT ----- Tell her that there is no change in her kidney function.  Her diabetes was not controlled, but we did go up on her dose of insulin on her last vsiit.  We will recheck this again on her next visit.

## 2023-01-13 NOTE — Telephone Encounter (Signed)
Pt notified of lab results

## 2023-02-08 ENCOUNTER — Other Ambulatory Visit: Payer: Self-pay | Admitting: Internal Medicine

## 2023-02-18 ENCOUNTER — Other Ambulatory Visit: Payer: Self-pay | Admitting: Internal Medicine

## 2023-03-02 ENCOUNTER — Other Ambulatory Visit: Payer: Self-pay | Admitting: Internal Medicine

## 2023-04-05 ENCOUNTER — Other Ambulatory Visit: Payer: Self-pay | Admitting: *Deleted

## 2023-04-05 DIAGNOSIS — I739 Peripheral vascular disease, unspecified: Secondary | ICD-10-CM

## 2023-04-05 DIAGNOSIS — I70213 Atherosclerosis of native arteries of extremities with intermittent claudication, bilateral legs: Secondary | ICD-10-CM

## 2023-04-08 ENCOUNTER — Encounter: Payer: Self-pay | Admitting: Internal Medicine

## 2023-04-08 ENCOUNTER — Ambulatory Visit: Payer: Medicare Other | Admitting: Internal Medicine

## 2023-04-08 ENCOUNTER — Other Ambulatory Visit: Payer: Self-pay | Admitting: Internal Medicine

## 2023-04-08 VITALS — BP 130/70 | HR 65 | Temp 97.8°F | Resp 18 | Wt 172.0 lb

## 2023-04-08 DIAGNOSIS — E1159 Type 2 diabetes mellitus with other circulatory complications: Secondary | ICD-10-CM

## 2023-04-08 DIAGNOSIS — Z794 Long term (current) use of insulin: Secondary | ICD-10-CM

## 2023-04-08 DIAGNOSIS — N183 Chronic kidney disease, stage 3 unspecified: Secondary | ICD-10-CM | POA: Diagnosis not present

## 2023-04-08 DIAGNOSIS — E1121 Type 2 diabetes mellitus with diabetic nephropathy: Secondary | ICD-10-CM | POA: Diagnosis not present

## 2023-04-08 NOTE — Assessment & Plan Note (Signed)
She has diabetic nephropathy and diabetes associated with PAD and CAD.  I would like to keep her HgBA1c <7%.  She does complain of minor hypoglycemia where I want her to take a snack like peanut butter crackers before she goes to bed.  We will check a HgBA1c on her on her next visit and this will be an annual wellness exam.

## 2023-04-08 NOTE — Progress Notes (Signed)
Office Visit  Subjective   Patient ID: Catherine Andrews   DOB: 27-Jan-1935   Age: 88 y.o.   MRN: [id number]   Chief Complaint Chief Complaint  Patient presents with   Follow-up    3 month follow up     History of Present Illness The patient is a 88 year old Caucasian/White female who returns for a follow-up visit for her T2 diabetes. She was diagnosed with diabetes about 40 years ago. On her last visit, her A1c was a bit elevated but we made no changes as she had her insulin recently changed at that time.  She remains on Tresiba 48 Units daily (she was previously on 50 Units daily) and novolog SSI.  She takes around 18-22 Units of novolog sliding scale when she uses it.  She states she takes the sliding scale 3 times per day.  She has been on metformin in the past but this has caused diarrhea.  She is not walking as much as they would like. She specifically denies unexplained abdominal pain, nausea or vomiting and documented hypoglycemia.  She states her freestyle Josephine Igo will alert her with low blood sugars first thing before breakfast maybe once per week.  She eats to fix this.  She checks blood sugars with a Freestyle libre and she states her blood sugars are ranging 70-150 and that is why she cut her tresiba dose down.  Her last HgBa1c was done 3 months ago and was 7.5%.  She came in fasting today in anticipation of lab work. She does not have any long term problems with diabetic neuropathy or retinopathy. She does have a history of diabetic with circulatory problems with PAD and CAD. It also looks like she has some diabetic nephropathy with Stage IIIa CKD.  She had a dilated diabetic eye exam was done on 01/07/2023 and there is no evidence of diabetic retinopathy.   She also has a history of Stage IIIb CKD which is probably due to her diabetes and HTN.  Her creatinine has been running 1-1.1 over the last 2 years with a GFR of 32-60.  She is currently on losartan and farxiga 10mg  daily.  She denies any  NSAIDS use.       Past Medical History Past Medical History:  Diagnosis Date   Anemia    Anesthesia complication    Arthritis    Breast cancer (HCC)    Right Breast- 20 yrs ago   CAD (coronary artery disease)    CHF (congestive heart failure) (HCC)    Colon polyp    Constipation    Depression    Esophageal reflux    Fever    Foot drop    High cholesterol    HTN (hypertension)    Hyperlipemia    Hypertension    Lung cancer (HCC)    Melanoma (HCC)    MI (myocardial infarction) (HCC)    Paroxysmal atrial fibrillation (HCC)    Peripheral arterial disease (HCC)    Skin ulcer of chin (HCC)    SOB (shortness of breath)    Syncope and collapse    Uses hearing aid    Vertigo    Wears partial dentures      Allergies Allergies  Allergen Reactions   Codeine Other (See Comments)    Other reaction(s): HALLUCINATIONS Other reaction(s): HALLUCINATIONS    Latex Itching, Hives and Rash    Per patient break outs  adhesives   Losartan     Other reaction(s): decline  gfr   Metformin Hcl     Other reaction(s): diarrhea     Medications  Current Outpatient Medications:    acetaminophen (TYLENOL) 325 MG tablet, Take 325 mg by mouth every 6 (six) hours as needed for moderate pain., Disp: , Rfl:    amLODipine (NORVASC) 10 MG tablet, TAKE 1 TABLET BY MOUTH EVERY EVENING FOR BLOOD PRESSURE, Disp: 90 tablet, Rfl: 2   aspirin 81 MG chewable tablet, Chew 81 mg by mouth every evening., Disp: , Rfl:    atorvastatin (LIPITOR) 40 MG tablet, TAKE 1 TABLET BY MOUTH ONCE DAILY FOR CHOLESTEROL, Disp: 90 tablet, Rfl: 2   BD PEN NEEDLE NANO 2ND GEN 32G X 4 MM MISC, , Disp: , Rfl:    buPROPion (WELLBUTRIN XL) 150 MG 24 hr tablet, Take 150 mg by mouth every morning., Disp: , Rfl:    cetirizine (ZYRTEC) 5 MG tablet, TAKE 1 TABLET BY MOUTH ONCE DAILY, Disp: 100 tablet, Rfl: 2   Cholecalciferol 125 MCG (5000 UT) TABS, , Disp: , Rfl:    Cyanocobalamin (VITAMIN B 12 PO), Take 1 tablet by mouth  daily., Disp: , Rfl:    famotidine (PEPCID) 20 MG tablet, Take 20 mg by mouth daily., Disp: , Rfl:    FARXIGA 10 MG TABS tablet, TAKE 1 TABLET BY MOUTH EVERY MORNING, Disp: 30 tablet, Rfl: 10   furosemide (LASIX) 40 MG tablet, TAKE 1 TABLET BY MOUTH EVERY OTHER DAY WITH POTASSIUM, Disp: 45 tablet, Rfl: 2   glucose blood (ACCU-CHEK AVIVA PLUS) test strip, 1 strip by Miscellaneous route 3 (three) times a day before meals., Disp: , Rfl:    hydrALAZINE (APRESOLINE) 50 MG tablet, Take 50 mg by mouth 3 (three) times daily., Disp: , Rfl:    losartan (COZAAR) 100 MG tablet, Take 100 mg by mouth daily., Disp: , Rfl:    Mag Threonate-Niacinamide ER (MAG-AMIDE) 500-250 MG TBCR, , Disp: , Rfl:    metoprolol succinate (TOPROL-XL) 50 MG 24 hr tablet, TAKE 1 & 1/2 TABLET = 75 MG BY MOUTH ONCE DAILY, Disp: 135 tablet, Rfl: 2   Multiple Vitamins-Minerals (PRESERVISION AREDS) CAPS, Take 1 capsule by mouth in the morning and at bedtime., Disp: , Rfl:    NOVOLOG FLEXPEN 100 UNIT/ML FlexPen, Inject 18-30 Units into the skin 3 (three) times daily with meals. Blood Sugar >199=18 units, >200-250=22 units, >250-300=26 units, >300=30 units, Disp: , Rfl:    omeprazole (PRILOSEC) 20 MG capsule, TAKE 1 CAPSULE BY MOUTH EVERY MORNING 30 MINUTES BEFORE MORNING MEAL *DO NOT CRUSH OR CHEW*, Disp: 90 capsule, Rfl: 2   potassium chloride (KLOR-CON) 10 MEQ tablet, TAKE ONE TABLET EVERY OTHER DAY WITH YOUR LASIX, Disp: 90 tablet, Rfl: 3   traZODone (DESYREL) 50 MG tablet, TAKE 1/2 TABLET = 25 MG BY MOUTH EVERY NIGHT AT BEDTIME, Disp: 45 tablet, Rfl: 2   TRESIBA FLEXTOUCH 100 UNIT/ML FlexTouch Pen, Inject 48 Units into the skin at bedtime., Disp: , Rfl:    venlafaxine XR (EFFEXOR-XR) 75 MG 24 hr capsule, TAKE 1 CAPSULE BY MOUTH ONCE DAILY *TAKE WITH FOOD* *DO NOT CRUSH OR CHEW*, Disp: 90 capsule, Rfl: 2   Review of Systems Review of Systems  Constitutional:  Negative for chills and fever.  Eyes:  Negative for blurred vision and  double vision.  Respiratory:  Negative for shortness of breath.   Cardiovascular:  Negative for chest pain, palpitations and leg swelling.  Gastrointestinal:  Negative for abdominal pain, constipation, diarrhea, nausea and vomiting.  Genitourinary:  Negative for frequency.  Neurological:  Negative for tingling, tremors and weakness.  Endo/Heme/Allergies:  Negative for polydipsia.       Objective:    Vitals BP 130/70 (BP Location: Left Arm, Patient Position: Sitting, Cuff Size: Normal)   Pulse 65   Temp 97.8 F (36.6 C)   Resp 18   Wt 172 lb (78 kg)   SpO2 95%   BMI 28.62 kg/m    Physical Examination Physical Exam Constitutional:      Appearance: Normal appearance. She is not ill-appearing.  Cardiovascular:     Rate and Rhythm: Normal rate and regular rhythm.     Pulses: Normal pulses.     Heart sounds: No murmur heard.    No friction rub. No gallop.  Pulmonary:     Effort: Pulmonary effort is normal. No respiratory distress.     Breath sounds: No wheezing, rhonchi or rales.  Abdominal:     General: Bowel sounds are normal. There is no distension.     Palpations: Abdomen is soft.     Tenderness: There is no abdominal tenderness.  Musculoskeletal:     Right lower leg: No edema.     Left lower leg: No edema.  Skin:    General: Skin is warm and dry.     Findings: No rash.  Neurological:     Mental Status: She is alert.        Assessment & Plan:   Diabetic Nephropathy She has diabetic nephropathy and diabetes associated with PAD and CAD.  I would like to keep her HgBA1c <7%.  She does complain of minor hypoglycemia where I want her to take a snack like peanut butter crackers before she goes to bed.  We will check a HgBA1c on her on her next visit and this will be an annual wellness exam.  Stage 3 chronic kidney disease (HCC) Her CKD has been stable.  We will continue with control of her diabetes.  I want her to try to exercise and be active.  She is to avoid  NSAIDS.  She is on farxiga and losartan.    Return in about 3 months (around 07/09/2023) for annual.   Crist Fat, MD

## 2023-04-08 NOTE — Assessment & Plan Note (Signed)
Her CKD has been stable.  We will continue with control of her diabetes.  I want her to try to exercise and be active.  She is to avoid NSAIDS.  She is on farxiga and losartan.

## 2023-04-16 NOTE — Progress Notes (Signed)
Her diabetes is now controlled.  Patient aware.

## 2023-04-26 NOTE — Progress Notes (Unsigned)
Patient name: Catherine Andrews MRN: [id number] DOB: May 28, 1935 Sex: female  REASON FOR CONSULT: 1 year follow-up, PAD  HPI: Catherine Andrews is a 88 y.o. female, with history of breast cancer, lung cancer, melanoma, diabetes, hypertension, hyperlipidemia, coronary artery disease status post CABG, atrial fibrillation, peripheral vascular disease that presents for one year follow-up for surveillance of her PAD.  She previously was under the care of a vascular surgeon in [place] but unfortunately her son died and she is unable to get transportation.  She has an extensive vascular surgery history including remote bilateral iliac stents and SFA stents.  She then later had a right fem to above-knee popliteal bypass with GSV in 2004.  She then had a left common femoral to above-knee popliteal bypass in 2013.  Ultimately this graft had to be removed due to infection and then she had a redo left femoral to BK pop bypass in 2017.  She had a chronic foot drop on the left.    She spends most of her time in a wheelchair, but is able to stand and pivot.   Past Medical History:  Diagnosis Date   Anemia    Anesthesia complication    Arthritis    Breast cancer (HCC)    Right Breast- 20 yrs ago   CAD (coronary artery disease)    CHF (congestive heart failure) (HCC)    Colon polyp    Constipation    Depression    Esophageal reflux    Fever    Foot drop    High cholesterol    HTN (hypertension)    Hyperlipemia    Hypertension    Lung cancer (HCC)    Melanoma (HCC)    MI (myocardial infarction) (HCC)    Paroxysmal atrial fibrillation (HCC)    Peripheral arterial disease (HCC)    Skin ulcer of chin (HCC)    SOB (shortness of breath)    Syncope and collapse    Uses hearing aid    Vertigo    Wears partial dentures     Past Surgical History:  Procedure Laterality Date   ABDOMINAL HYSTERECTOMY     BREAST BIOPSY     BREAST CYST EXCISION     BREAST LUMPECTOMY Right    20 yrs ago   cardiac stents      CARDIAC SURGERY     CATARACT EXTRACTION      Family History  Problem Relation Age of Onset   Kidney disease Mother    Diabetes Mother    Heart failure Father    Diabetes Father    Hyperlipidemia Sister    Diabetes Sister    Diabetes Brother    Lymphoma Brother    Diabetes Maternal Grandmother    Diabetes Paternal Grandfather    Hypertension Other    Diabetes Son     SOCIAL HISTORY: Social History   Socioeconomic History   Marital status: Widowed    Spouse name: Not on file   Number of children: Not on file   Years of education: Not on file   Highest education level: Not on file  Occupational History   Not on file  Tobacco Use   Smoking status: Former   Smokeless tobacco: Never  Vaping Use   Vaping status: Never Used  Substance and Sexual Activity   Alcohol use: Never   Drug use: Never   Sexual activity: Not on file  Other Topics Concern   Not on file  Social History Narrative  Not on file   Social Determinants of Health   Financial Resource Strain: Not on file  Food Insecurity: Not on file  Transportation Needs: Not on file  Physical Activity: Not on file  Stress: Not on file  Social Connections: Not on file  Intimate Partner Violence: Not on file    Allergies  Allergen Reactions   Codeine Other (See Comments)    Other reaction(s): HALLUCINATIONS Other reaction(s): HALLUCINATIONS    Latex Itching, Hives and Rash    Per patient break outs  adhesives   Losartan     Other reaction(s): decline gfr   Metformin Hcl     Other reaction(s): diarrhea    Current Outpatient Medications  Medication Sig Dispense Refill   acetaminophen (TYLENOL) 325 MG tablet Take 325 mg by mouth every 6 (six) hours as needed for moderate pain.     amLODipine (NORVASC) 10 MG tablet TAKE 1 TABLET BY MOUTH EVERY EVENING FOR BLOOD PRESSURE 90 tablet 2   aspirin 81 MG chewable tablet Chew 81 mg by mouth every evening.     atorvastatin (LIPITOR) 40 MG tablet TAKE 1 TABLET  BY MOUTH ONCE DAILY FOR CHOLESTEROL 90 tablet 2   BD PEN NEEDLE NANO 2ND GEN 32G X 4 MM MISC      buPROPion (WELLBUTRIN XL) 150 MG 24 hr tablet Take 150 mg by mouth every morning.     cetirizine (ZYRTEC) 5 MG tablet TAKE 1 TABLET BY MOUTH ONCE DAILY 100 tablet 2   Cholecalciferol 125 MCG (5000 UT) TABS      Cyanocobalamin (VITAMIN B 12 PO) Take 1 tablet by mouth daily.     famotidine (PEPCID) 20 MG tablet Take 20 mg by mouth daily.     FARXIGA 10 MG TABS tablet TAKE 1 TABLET BY MOUTH EVERY MORNING 30 tablet 10   furosemide (LASIX) 40 MG tablet TAKE 1 TABLET BY MOUTH EVERY OTHER DAY WITH POTASSIUM 45 tablet 2   glucose blood (ACCU-CHEK AVIVA PLUS) test strip 1 strip by Miscellaneous route 3 (three) times a day before meals.     hydrALAZINE (APRESOLINE) 50 MG tablet Take 50 mg by mouth 3 (three) times daily.     losartan (COZAAR) 100 MG tablet Take 100 mg by mouth daily.     Mag Threonate-Niacinamide ER (MAG-AMIDE) 500-250 MG TBCR      metoprolol succinate (TOPROL-XL) 50 MG 24 hr tablet TAKE 1 & 1/2 TABLET = 75 MG BY MOUTH ONCE DAILY 135 tablet 2   Multiple Vitamins-Minerals (PRESERVISION AREDS) CAPS Take 1 capsule by mouth in the morning and at bedtime.     NOVOLOG FLEXPEN 100 UNIT/ML FlexPen Inject 18-30 Units into the skin 3 (three) times daily with meals. Blood Sugar >199=18 units, >200-250=22 units, >250-300=26 units, >300=30 units     omeprazole (PRILOSEC) 20 MG capsule TAKE 1 CAPSULE BY MOUTH EVERY MORNING 30 MINUTES BEFORE MORNING MEAL *DO NOT CRUSH OR CHEW* 90 capsule 2   potassium chloride (KLOR-CON) 10 MEQ tablet TAKE ONE TABLET EVERY OTHER DAY WITH YOUR LASIX 90 tablet 3   traZODone (DESYREL) 50 MG tablet TAKE 1/2 TABLET = 25 MG BY MOUTH EVERY NIGHT AT BEDTIME 45 tablet 2   TRESIBA FLEXTOUCH 100 UNIT/ML FlexTouch Pen Inject 48 Units into the skin at bedtime.     venlafaxine XR (EFFEXOR-XR) 75 MG 24 hr capsule TAKE 1 CAPSULE BY MOUTH ONCE DAILY *TAKE WITH FOOD* *DO NOT CRUSH OR CHEW* 90  capsule 2   No current facility-administered medications  for this visit.    REVIEW OF SYSTEMS:  [X]  denotes positive finding, [ ]  denotes negative finding Cardiac  Comments:  Chest pain or chest pressure:    Shortness of breath upon exertion:    Short of breath when lying flat:    Irregular heart rhythm:        Vascular    Pain in calf, thigh, or hip brought on by ambulation:    Pain in feet at night that wakes you up from your sleep:     Blood clot in your veins:    Leg swelling:         Pulmonary    Oxygen at home:    Productive cough:     Wheezing:         Neurologic    Sudden weakness in arms or legs:     Sudden numbness in arms or legs:     Sudden onset of difficulty speaking or slurred speech:    Temporary loss of vision in one eye:     Problems with dizziness:         Gastrointestinal    Blood in stool:     Vomited blood:         Genitourinary    Burning when urinating:     Blood in urine:        Psychiatric    Major depression:         Hematologic    Bleeding problems:    Problems with blood clotting too easily:        Skin    Rashes or ulcers:        Constitutional    Fever or chills:      PHYSICAL EXAM: There were no vitals filed for this visit.   GENERAL: The patient is a well-nourished female, in no acute distress. The vital signs are documented above. CARDIAC: There is a regular rate and rhythm.  VASCULAR:  Bilateral femoral pulses palpable Bilateral DP PT signals brisk by Doppler and biphasic No lower extremity tissue loss PULMONARY: No respiratory distress. ABDOMEN: Soft and non-tender. MUSCULOSKELETAL: There are no major deformities or cyanosis. NEUROLOGIC: No focal weakness or paresthesias are detected. SKIN: There are no ulcers or rashes noted. PSYCHIATRIC: The patient has a normal affect.  DATA:   Lower extremity arterial duplex today shows bilateral lower extremity femoropopliteal bypass is patent with no visualized high  grade stenosis.  ABI's today are 0.83 on the right biphasic and 1.37 on the left biphasic  Assessment/Plan:  88 y.o. female, with history of breast cancer, lung cancer, melanoma, diabetes, hypertension, hyperlipidemia, coronary artery disease status post CABG, atrial fibrillation, peripheral vascular disease that presents for one year follow-up for surveillance of PAD.  She previously was under the care of a vascular surgeon in [place] but unfortunately her son died and she has unable to get transportation.    She has an extensive vascular surgery history including remote bilateral iliac stents and SFA stents.  She then later had a right femoral to above-knee popliteal bypass in 2004.  She then had a left common femoral to above-knee popliteal bypass in 2013.  Ultimately this graft had to be removed after it got infected and then she had a redo left femoral to BK pop bypass in 2017.  She has a chronic left foot drop.      Cephus Shelling, MD [facility] Office: [phone]

## 2023-04-27 ENCOUNTER — Ambulatory Visit: Payer: Medicare Other | Admitting: Vascular Surgery

## 2023-04-27 ENCOUNTER — Ambulatory Visit (HOSPITAL_COMMUNITY)
Admission: RE | Admit: 2023-04-27 | Discharge: 2023-04-27 | Disposition: A | Discharge status: Home / Self Care | Payer: Medicare Other | Source: Ambulatory Visit | Attending: Vascular Surgery | Admitting: Vascular Surgery

## 2023-04-27 ENCOUNTER — Encounter: Payer: Self-pay | Admitting: Vascular Surgery

## 2023-04-27 ENCOUNTER — Ambulatory Visit (INDEPENDENT_AMBULATORY_CARE_PROVIDER_SITE_OTHER)
Admission: RE | Admit: 2023-04-27 | Discharge: 2023-04-27 | Disposition: A | Discharge status: Home / Self Care | Payer: Medicare Other | Source: Ambulatory Visit | Attending: Vascular Surgery | Admitting: Vascular Surgery

## 2023-04-27 VITALS — BP 168/77 | HR 56 | Temp 97.5°F

## 2023-04-27 DIAGNOSIS — I70213 Atherosclerosis of native arteries of extremities with intermittent claudication, bilateral legs: Secondary | ICD-10-CM

## 2023-04-27 DIAGNOSIS — I739 Peripheral vascular disease, unspecified: Secondary | ICD-10-CM

## 2023-04-27 LAB — VAS US ABI WITH/WO TBI
Left ABI: 1.02
Right ABI: 0.88

## 2023-05-18 ENCOUNTER — Other Ambulatory Visit: Payer: Self-pay

## 2023-05-18 DIAGNOSIS — I739 Peripheral vascular disease, unspecified: Secondary | ICD-10-CM

## 2023-05-25 ENCOUNTER — Other Ambulatory Visit: Payer: Self-pay | Admitting: Internal Medicine

## 2023-06-01 ENCOUNTER — Other Ambulatory Visit: Payer: Self-pay

## 2023-06-01 ENCOUNTER — Other Ambulatory Visit: Payer: Self-pay | Admitting: Internal Medicine

## 2023-06-01 MED ORDER — LOSARTAN POTASSIUM 100 MG PO TABS: 100.0000 mg | ORAL_TABLET | Freq: Every day | ORAL | 4 refills | Status: DC

## 2023-07-15 ENCOUNTER — Encounter: Payer: Self-pay | Admitting: Internal Medicine

## 2023-07-15 ENCOUNTER — Other Ambulatory Visit: Payer: Self-pay | Admitting: Internal Medicine

## 2023-07-15 ENCOUNTER — Ambulatory Visit: Payer: Medicare Other | Admitting: Internal Medicine

## 2023-07-15 VITALS — BP 120/78 | HR 61 | Temp 97.2°F | Resp 18 | Ht 64.0 in | Wt 171.0 lb

## 2023-07-15 DIAGNOSIS — Z794 Long term (current) use of insulin: Secondary | ICD-10-CM

## 2023-07-15 DIAGNOSIS — Z85118 Personal history of other malignant neoplasm of bronchus and lung: Secondary | ICD-10-CM

## 2023-07-15 DIAGNOSIS — Z0000 Encounter for general adult medical examination without abnormal findings: Secondary | ICD-10-CM

## 2023-07-15 DIAGNOSIS — Z6829 Body mass index (BMI) 29.0-29.9, adult: Secondary | ICD-10-CM | POA: Insufficient documentation

## 2023-07-15 DIAGNOSIS — N1832 Chronic kidney disease, stage 3b: Secondary | ICD-10-CM

## 2023-07-15 DIAGNOSIS — Z1331 Encounter for screening for depression: Secondary | ICD-10-CM | POA: Diagnosis not present

## 2023-07-15 DIAGNOSIS — I10 Essential (primary) hypertension: Secondary | ICD-10-CM | POA: Diagnosis not present

## 2023-07-15 DIAGNOSIS — I5032 Chronic diastolic (congestive) heart failure: Secondary | ICD-10-CM

## 2023-07-15 DIAGNOSIS — E1121 Type 2 diabetes mellitus with diabetic nephropathy: Secondary | ICD-10-CM

## 2023-07-15 DIAGNOSIS — E1159 Type 2 diabetes mellitus with other circulatory complications: Secondary | ICD-10-CM

## 2023-07-15 DIAGNOSIS — I4891 Unspecified atrial fibrillation: Secondary | ICD-10-CM | POA: Insufficient documentation

## 2023-07-15 DIAGNOSIS — E782 Mixed hyperlipidemia: Secondary | ICD-10-CM

## 2023-07-15 DIAGNOSIS — Z8551 Personal history of malignant neoplasm of bladder: Secondary | ICD-10-CM

## 2023-07-15 DIAGNOSIS — I739 Peripheral vascular disease, unspecified: Secondary | ICD-10-CM

## 2023-07-15 DIAGNOSIS — I25810 Atherosclerosis of coronary artery bypass graft(s) without angina pectoris: Secondary | ICD-10-CM

## 2023-07-15 DIAGNOSIS — F330 Major depressive disorder, recurrent, mild: Secondary | ICD-10-CM

## 2023-07-15 DIAGNOSIS — I70203 Unspecified atherosclerosis of native arteries of extremities, bilateral legs: Secondary | ICD-10-CM

## 2023-07-15 NOTE — Assessment & Plan Note (Signed)
She has mild recurrent depression which is controlled.  We will continue on her current psychotropic medications.

## 2023-07-15 NOTE — Assessment & Plan Note (Signed)
She also has an appointment with oncology where they are doing surveilannce with imaging.  She is in remission.

## 2023-07-15 NOTE — Assessment & Plan Note (Signed)
She has diabetic nephropathy.  We will check her urine studies today and check a HgBA1c.

## 2023-07-15 NOTE — Assessment & Plan Note (Signed)
We will check her FLP with goal LDL <55.

## 2023-07-15 NOTE — Assessment & Plan Note (Signed)
There is no evidence of volume overload.  She remains on lasix at this time.

## 2023-07-15 NOTE — Progress Notes (Signed)
Preventive Screening-Counseling & Management     Catherine Andrews is a 88 y.o. female who presents for Medicare Annual/Subsequent preventive examination.  She is currently at [facility] where she moved here in 11/2019.  The patient had a dilated eye exam done on 07/09/2023 done by Dr. Charlotte Sanes at [facility] which there was no diabetic retinopathy.  They are following her for macular degeneration.  Her last mammogram was on 08/13/2022 and this was normal.  Her last colonoscopy was in 06/2019 where they had done a surveillance PET in 06/06/2019 and the patient was found to have a any area of hypermetabolsm in the proximal right colon.  Her colonscopy on 06/29/2019 demonstrated a non-bleeding external and internal hemorrhoids and the entire colon appeared normal.  There was no endoscopic evidence of inflammation, mass or tumor in the entire colon. Her previous colonoscopy was done in 11/2017 demonstrated an ascending colon tubuar adenoma.  She had an EGD done in 11/2017 that showed a 3 cm hiatal hernia  and congestive gastropathy and normal duodenum.  She denies any urinary incontinence.  She has a history of tobacco abuse where she began smoking at age 68 where she was smoking 1ppd.  She quit smoking in 2008.  The patient does not exercise.  She does get yearly flu vaccines.  She had a prevnar 13 vaccine in 02/2015 and a pneumovax 23 vaccines on 11/2009 and again on 03/2012.  She had a shingrix vaccine done in 2023.  She had a RSV vaccine done in 2023.  She has had 6 COVID-19 vaccines including 4 boosters.  Today, she denies any depression, anxiety or memory loss.  She is on ASA 81mg  daily.    The patient is a 88 year old Caucasian/White female who returns for a follow-up visit for her T2 diabetes. She was diagnosed with diabetes about 40 years ago. This past year, her A1c was a bit elevated but we made no changes as she had her insulin recently changed at that time.  She remains on  Tresiba 48 Units daily (she was previously on 50 Units daily) and novolog SSI.  She takes around 18-22 Units of novolog sliding scale when she uses it.  She states she takes the sliding scale 3 times per day.  She has been on metformin in the past but this has caused diarrhea.  She is not walking as much as they would like. She specifically denies unexplained abdominal pain, nausea or vomiting and documented hypoglycemia.  She states her freestyle Josephine Igo will alert her with low blood sugars first thing before breakfast maybe once per week.  She eats to fix this.  She checks blood sugars with a Freestyle libre and she states her blood sugars are ranging 90-150.  Her last HgBa1c was done 3 months ago and was 6.7%.  She came in fasting today in anticipation of lab work. She does not have any long term problems with diabetic neuropathy or retinopathy. She does have a history of diabetic with circulatory problems with PAD and CAD. It also looks like she has some diabetic nephropathy with Stage IIIa CKD.  The patient had a dilated eye exam done on 07/09/2023 done by Dr. Charlotte Sanes at [facility] which there was no diabetic retinopathy.   She also has a history of Stage IIIb CKD which is probably due to her diabetes and HTN.  Her creatinine has been running 1-1.1 over the last 2 years with a GFR of 32-60.  She  is currently on losartan and farxiga 10mg  daily.  She denies any NSAIDS use.  The patient is a 88 year old Caucasian/White female who presents for a follow-up evaluation of hypertension.  This past year, her BP was not controlled and we increased her losartan from 50mg  daily to 100mg  daily.  The patient has been checking her blood pressure at home. The patient's blood pressure has ranged systollically in the 120-130's range. The patient's current medications include: amlodipine 10mg  daily, Lasix 40mg  every other day, hydralazine 50mg  TID, and losartan 100mg  daily.  The patient has been tolerating her  medications well. The patient denies any headache, visual changes, dizziness, lightheadness, chest pain, shortness of breath, weakness/numbness, and edema.   She also has a history of peripheral vascular disease where she last saw vascular surgery with Dr. Chestine Spore in 04/2023.  She previously was under the care of a vascular surgeon in [place] but unfortunately her son died and she is unable to get transportation.  She has an extensive vascular surgery history including remote bilateral iliac stents and SFA stents.  She then later had a right fem to above-knee popliteal bypass with GSV in 2004.  She then had a left common femoral to above-knee popliteal bypass in 2013.  Ultimately this graft had to be removed due to infection and then she had a redo left femoral to BK pop bypass in 2017.  She had a chronic foot drop on the left and she states this occurred after her CABG years ago.  They did a lower extremity arterial duplex in 04/2023 that showed bilateral lower extremity femoropopliteal bypass patent with no visualized stenosis.  Her ABI's were 0.88 on the right biphasic and 1.02 on the left biphasic.  They felt her bypass grafts were patent by duplex and she had no evidence of significant recurrent stenosis.  They want her to return in 1 year.  Catherine Andrews also has a history of CAD where she had a NSTEMI in 2015.  She had CABG x 2 once in 2015 and again in 2016.    She states she has had 2 heart attacks in the past.  Her last heart cath was done in 11/2013 that showed a 60% LM; 90% LAD, 50% RCA, 80% d RCA) Patient underwent PCI to LAD in 2015  Pt then underwent CABG with LIMA to LAD.     In Nov 2015 her heat cath showed; LIMA to apical LAD occluded.   Pt underwent PCI and DES x 2 to RCA.  In Nov 2016 :Urgent redo  CABG x 3.  She is currently on lipitor, farxiga, losartan and metoprolol.  She has no baseline symptoms if angina and denies any chest pain or DOE.  She is on ASA 81mg  daily.  Today, she denies any  chest pain, SOB, heart palpitations, periperhal edema, dizziness or other problems.  The patient also has a history of atrial fibrillation.  She states she developed her A. Fib with her first MI in 2015 when she was in the hospital.  She is currently on metoprolol for rate control.  She does have a history of systolic CHF in her notes but her last ECHO was done 12/2016 and this showed a normal LVEF of 55-60%.  There was no regional wall motion abnormalities and she did have grade I diastolic dysfunction.  Today, she denies any heart palpitaitons, SOB, syncope, dizziness, orthopnea or other problems.  She is also followed by Oncology with last visit in 02/2023 where  she has had a history of non-small cell lung cancer.  She was noted  to have a lung nodule back in 2016 that was being followed.  They did a PET/CT scan for staging showed both the lung nodule which had increased in size from 9 x 8 mm to 11 x 10 mm and had an FDG avidity of 6.6. There was also pleural nodularity and thickening suspicious for metastatic disease without a pleural effusion. This had maximum SUV of 4.6. The remainder of the abdomen, and musculoskeletal systems was negative. There was a 9 mm sob dermal nodule along the posterior left shoulder which showed some mild uptake with an SUV of 2.  The patient patient was asymptomatic with the exception of a chronic cough.  She  underwent a left VATS with lysis of adhesions on 04/26/2017 with a left pleural biopsy and upper lobe wedge resection.  Pathology of the left upper lobe nodule showed an adenocarcinoma, acinar predominant. Tumor was TTF-1-1 positive and moderately differentiated 1.5 x 0.8 cm, unifocal, with visceral pleural invasion. The parietal pleura was also involved. There was focal TPF-1 reactivity in fibroadipose tissue which supported subparietal pleural invasion by lung adenocarcinoma.  A separate pleural biopsy showed focal invasive lung adenocarcinoma involving fibroadipose tissue.  There was no evidence of metastatic melanoma. Another pleural biopsy just showed focal subpleural fibrosis with focal atypia nondiagnostic for carcinoma.  Staging was pT3NxM0.  They found she had a melanoma on her shoulder and she underwent  local excision of upper limb shoulder left side melanoma on 06/02/2017.  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 It invaded the subcutis. There is no evidence of ulceration. Margins were negative, mitotic rate greater than 1/mm. No evidence of microsatellitosis, lymphovascular invasion or perineural invasion. There was brisk tumor infiltrating lymphocytes.  She started [place] in 06/16/2017 and completed  5 cycles of Keytruda, last on 09/2017. 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  They have been doing Ct scans and her last CT of the abdomen and pelvis was unremarkable. There was a 9 mm groundglass nodule in the upper lobe of the right lung which was unchanged as well as some scarring in the lower lobe of the lung. She is currently in long-term remission.  The patient also has a history of bladder cancer which was discovered in 2023.  Her bladder tumors-noninvasive-patient went for cystoscopic evaluation biopsy and fulguration on 12/11/2021. The bladder tumors were seen the largest was on the anterior bladder wall near the bladder neck the second which is to the left. These were biopsied and then the entire region was fulgurated approximately 3 cm in total space.  Pathology showed noninvasive papillary urothelial carcinoma, low-grade lamina propria was present negative for tumor, muscularis propria with absent, lymphovascular invasion was identified carcinoma in situ was also not identified. Less than 5% had high-grade features of the tumor.  She had followup cystoscopy in summer/2023 per patient's report was negative.  She tells me that she goes back to see urology for cystocopy in 10/2022.    The patient also has a history of depression  which she states has been going on for years and initially started about 20-25 years ago.  She states her depression is mild and controlled.  She is currently on Wellbutrin XL 150mg  daily and Effexor XR 75mg  daily and she takes trazodone at night for sleep.  She states she is sleeping well.  She denies any SI/HI.  She  states her depression is controlled and she denies any anxiety.      Are there smokers in your home (other than you)? No  Risk Factors Current exercise habits:  as above   Dietary issues discussed: none   Depression Screen (Note: if answer to either of the following is "Yes", a more complete depression screening is indicated)   Over the past two weeks, have you felt down, depressed or hopeless? No  Over the past two weeks, have you felt little interest or pleasure in doing things? No  Have you lost interest or pleasure in daily life? No  Do you often feel hopeless? No  Do you cry easily over simple problems? No  Activities of Daily Living In your present state of health, do you have any difficulty performing the following activities?:  Driving? Yes Managing money?  No Feeding yourself? No Getting from bed to chair? No Climbing a flight of stairs? Yes Preparing food and eating?: No Bathing or showering? No Getting dressed: No Getting to the toilet? No Using the toilet:Yes Moving around from place to place: No In the past year have you fallen or had a near fall?:No   Are you sexually active?  No  Do you have more than one partner?  No  Hearing Difficulties: Yes- she does have hearing aids Do you often ask people to speak up or repeat themselves? Yes Do you experience ringing or noises in your ears? No Do you have difficulty understanding soft or whispered voices? Yes   Do you feel that you have a problem with memory? No  Do you often misplace items? No  Do you feel safe at home?  Yes  Cognitive Testing  Alert? Yes  Normal Appearance?Yes  Oriented to person?  Yes  Place? Yes   Time? Yes  Recall of three objects?  Yes  Can perform simple calculations? Yes  Displays appropriate judgment?Yes  Can read the correct time from a watch face?Yes  Fall Risk Prevention  Any stairs in or around the home? Yes  If so, are there any without handrails? Yes  Home free of loose throw rugs in walkways, pet beds, electrical cords, etc? Yes  Adequate lighting in your home to reduce risk of falls? Yes  Use of a cane, walker or w/c? Yes  motorized wheelchair   Time Up and Go  Was the test performed? Marland Kitchen Not performed.    Advanced Directives have been discussed with the patient? Yes   List the Names of Other Physician/Practitioners you currently use: Patient Care Team: Crist Fat, MD as PCP - General (Internal Medicine) Pricilla Riffle, MD as PCP - Cardiology (Cardiology)    Past Medical History:  Diagnosis Date   Anemia    Anesthesia complication    Arthritis    Breast cancer (HCC)    Right Breast- 20 yrs ago   CAD (coronary artery disease)    CHF (congestive heart failure) (HCC)    Colon polyp    Constipation    Depression    Esophageal reflux    Fever    Foot drop    High cholesterol    HTN (hypertension)    Hyperlipemia    Hypertension    Lung cancer (HCC)    Melanoma (HCC)    MI (myocardial infarction) (HCC)    Paroxysmal atrial fibrillation (HCC)    Peripheral arterial disease (HCC)    Skin ulcer of chin (HCC)    SOB (shortness of breath)  Syncope and collapse    Uses hearing aid    Vertigo    Wears partial dentures     Past Surgical History:  Procedure Laterality Date   ABDOMINAL HYSTERECTOMY     BREAST BIOPSY     BREAST CYST EXCISION     BREAST LUMPECTOMY Right    20 yrs ago   cardiac stents     CARDIAC SURGERY     CATARACT EXTRACTION        Current Medications  Current Outpatient Medications  Medication Sig Dispense Refill   acetaminophen (TYLENOL) 325 MG tablet Take 325 mg by mouth every 6 (six) hours as  needed for moderate pain.     ADMELOG SOLOSTAR 100 UNIT/ML KwikPen INJECT 18 UNITS SUBQ FOR SUGAR 150, 22 UNITS FOR SUGAR 151-200, 26 UNITS FOR 201-250, 30 UNITS FOR 251-300 THREE TIMES A DAY *EXPIRES 28 DAYS AFTER OPENING* 30 mL 10   amLODipine (NORVASC) 10 MG tablet TAKE 1 TABLET BY MOUTH EVERY EVENING FOR BLOOD PRESSURE 90 tablet 2   aspirin 81 MG chewable tablet Chew 81 mg by mouth every evening.     atorvastatin (LIPITOR) 40 MG tablet TAKE 1 TABLET BY MOUTH ONCE DAILY FOR CHOLESTEROL 90 tablet 2   BD PEN NEEDLE NANO 2ND GEN 32G X 4 MM MISC      buPROPion (WELLBUTRIN XL) 150 MG 24 hr tablet Take 150 mg by mouth every morning.     cetirizine (ZYRTEC) 5 MG tablet TAKE 1 TABLET BY MOUTH ONCE DAILY 100 tablet 2   Cholecalciferol 125 MCG (5000 UT) TABS      Cyanocobalamin (VITAMIN B 12 PO) Take 1 tablet by mouth daily.     famotidine (PEPCID) 20 MG tablet Take 20 mg by mouth daily.     FARXIGA 10 MG TABS tablet TAKE 1 TABLET BY MOUTH EVERY MORNING 30 tablet 10   furosemide (LASIX) 40 MG tablet TAKE 1 TABLET BY MOUTH EVERY OTHER DAY WITH POTASSIUM 45 tablet 2   glucose blood (ACCU-CHEK AVIVA PLUS) test strip 1 strip by Miscellaneous route 3 (three) times a day before meals.     hydrALAZINE (APRESOLINE) 50 MG tablet Take 50 mg by mouth 3 (three) times daily.     losartan (COZAAR) 100 MG tablet Take 1 tablet (100 mg total) by mouth daily. 90 tablet 4   Mag Threonate-Niacinamide ER (MAG-AMIDE) 500-250 MG TBCR      metoprolol succinate (TOPROL-XL) 50 MG 24 hr tablet TAKE 1 & 1/2 TABLET = 75 MG BY MOUTH ONCE DAILY 135 tablet 2   Multiple Vitamins-Minerals (PRESERVISION AREDS) CAPS Take 1 capsule by mouth in the morning and at bedtime.     NOVOLOG FLEXPEN 100 UNIT/ML FlexPen Inject 18-30 Units into the skin 3 (three) times daily with meals. Blood Sugar >199=18 units, >200-250=22 units, >250-300=26 units, >300=30 units     omeprazole (PRILOSEC) 20 MG capsule TAKE 1 CAPSULE BY MOUTH EVERY MORNING 30  MINUTES BEFORE MORNING MEAL *DO NOT CRUSH OR CHEW* 90 capsule 2   potassium chloride (KLOR-CON) 10 MEQ tablet TAKE ONE TABLET EVERY OTHER DAY WITH YOUR LASIX 90 tablet 3   traZODone (DESYREL) 50 MG tablet TAKE 1/2 TABLET = 25 MG BY MOUTH EVERY NIGHT AT BEDTIME 45 tablet 2   TRESIBA FLEXTOUCH 100 UNIT/ML FlexTouch Pen Inject 48 Units into the skin at bedtime.     venlafaxine XR (EFFEXOR-XR) 75 MG 24 hr capsule TAKE 1 CAPSULE BY MOUTH ONCE DAILY *TAKE WITH FOOD* *DO NOT  CRUSH OR CHEW* 90 capsule 2   No current facility-administered medications for this visit.    Allergies Codeine, Latex, Losartan, and Metformin hcl   Social History Social History   Tobacco Use   Smoking status: Former   Smokeless tobacco: Never  Substance Use Topics   Alcohol use: Never     Review of Systems Review of Systems  Constitutional:  Negative for chills and fever.  Eyes:  Negative for blurred vision and double vision.  Respiratory:  Negative for cough, sputum production, shortness of breath and wheezing.   Cardiovascular:  Negative for chest pain, palpitations, orthopnea and leg swelling.  Gastrointestinal:  Positive for constipation. Negative for abdominal pain, blood in stool, diarrhea, heartburn, melena, nausea and vomiting.  Genitourinary:  Negative for frequency and hematuria.  Musculoskeletal:  Negative for myalgias.  Skin:  Negative for itching and rash.  Neurological:  Negative for dizziness, weakness and headaches.  Endo/Heme/Allergies:  Negative for polydipsia.  Psychiatric/Behavioral:  The patient does not have insomnia.      Physical Exam:      Body mass index is 29.35 kg/m. BP 120/78 (BP Location: Left Arm, Patient Position: Sitting, Cuff Size: Normal)   Pulse 61   Temp (!) 97.2 F (36.2 C)   Resp 18   Ht 5\' 4"  (1.626 m)   Wt 171 lb (77.6 kg)   SpO2 92%   BMI 29.35 kg/m   Physical Exam Constitutional:      Appearance: Normal appearance. She is not ill-appearing.  HENT:      Head: Normocephalic and atraumatic.     Right Ear: Tympanic membrane, ear canal and external ear normal.     Left Ear: Tympanic membrane, ear canal and external ear normal.     Nose: Nose normal. No congestion or rhinorrhea.     Mouth/Throat:     Mouth: Mucous membranes are moist.     Pharynx: Oropharynx is clear. No posterior oropharyngeal erythema.  Eyes:     General: No scleral icterus.    Conjunctiva/sclera: Conjunctivae normal.     Pupils: Pupils are equal, round, and reactive to light.  Neck:     Thyroid: No thyromegaly.     Vascular: No carotid bruit.  Cardiovascular:     Rate and Rhythm: Normal rate and regular rhythm.     Pulses: Normal pulses.     Heart sounds: Normal heart sounds. No murmur heard.    No friction rub. No gallop.  Pulmonary:     Effort: Pulmonary effort is normal. No respiratory distress.     Breath sounds: Normal breath sounds. No wheezing, rhonchi or rales.  Abdominal:     General: Abdomen is flat. Bowel sounds are normal. There is no distension.     Palpations: Abdomen is soft.     Tenderness: There is no abdominal tenderness.  Musculoskeletal:     Cervical back: Normal range of motion. No tenderness.     Right lower leg: No edema.     Left lower leg: No edema.     Comments: No clubbing or cyanosis.  She does have a left foot drop on exam.  Lymphadenopathy:     Cervical: No cervical adenopathy.  Skin:    General: Skin is warm and dry.     Findings: No rash.  Neurological:     General: No focal deficit present.     Mental Status: She is alert and oriented to person, place, and time.     Comments: CN II-XII grossly  intact  Psychiatric:        Mood and Affect: Mood normal.        Behavior: Behavior normal.      Assessment:      Type 2 diabetes mellitus with other circulatory complication, with long-term current use of insulin (HCC) - Plan: CBC with Differential/Platelet, CMP14 + Anion Gap, Hemoglobin A1c, Lipid panel, Microalbumin /  creatinine urine ratio, TSH  Coronary artery disease involving coronary bypass graft of native heart without angina pectoris  PAD (peripheral artery disease) (HCC)  Essential hypertension  Mixed hyperlipidemia  Atrial fibrillation, unspecified type (HCC)  History of lung cancer  History of bladder cancer  Diabetic nephropathy associated with type 2 diabetes mellitus (HCC)  Stage 3b chronic kidney disease (HCC)  Mild episode of recurrent major depressive disorder (HCC)  BMI 29.0-29.9,adult  Atherosclerosis of native artery of both lower extremities, with unspecified presence of clinical manifestation (HCC)  Chronic diastolic congestive heart failure (HCC)    Plan:     During the course of the visit the patient was educated and counseled about appropriate screening and preventive services including:   Pneumococcal vaccine  Influenza vaccine Screening mammography Colorectal cancer screening Advanced directives: discussed  Diet review for nutrition referral? Yes ____  Not Indicated __x__   Patient Instructions (the written plan) was given to the patient.  Atherosclerosis of native artery of extremity (HCC) She is seen every 6 months by vascular surgery where she has had multiple LE bypasses.  Her bypasses on last visit were patient.  We will continue with risk factor modification and she is on an ASA.  She denies any claudication symptoms.  Atrial fibrillation (HCC) She is currently rate controlled with metoprolol.  She is on an ASA.  Chronic diastolic congestive heart failure (HCC) There is no evidence of volume overload.  She remains on lasix at this time.  Coronary artery disease She has CAD with history of MI and history of CABG.  We will continue with secondary prevention.  She is on a statin and ASA.  She denies any angina at this time.  Essential hypertension Her BP is currently controlled.  We will continue on her current meds.  PAD (peripheral artery  disease) (HCC) Plan as above.  Diabetic Nephropathy She has diabetic nephropathy.  We will check her urine studies today and check a HgBA1c.  Stage 3b chronic kidney disease (HCC) She has CKD due to her HTN and DM.  We will continue on farxiga and losartan.  She is to avoid NSAIDS.  BMI 29.0-29.9,adult She cannot exercise due to her mobility with left foot drop.  She uses a motorized wheelchair to get around.  I want her to eat healthy, be active and lose weight.  Depression She has mild recurrent depression which is controlled.  We will continue on her current psychotropic medications.  History of bladder cancer She has an appointment for cystoscopy coming up with urology.  History of lung cancer She also has an appointment with oncology where they are doing surveilannce with imaging.  She is in remission.  Hyperlipidemia We will check her FLP with goal LDL <55.    Prevention Health maintenance discussed.  She will need a mammogram coming up.  We will obtain some yearly labs.   Medicare Attestation I have personally reviewed: The patient's medical and social history Their use of alcohol, tobacco or illicit drugs Their current medications and supplements The patient's functional ability including ADLs,fall risks, home safety risks, cognitive, and  hearing and visual impairment Diet and physical activities Evidence for depression or mood disorders  The patient's weight, height, and BMI have been recorded in the chart.  I have made referrals, counseling, and provided education to the patient based on review of the above and I have provided the patient with a written personalized care plan for preventive services.     Crist Fat, MD   07/15/2023

## 2023-07-15 NOTE — Assessment & Plan Note (Signed)
Plan as above.  

## 2023-07-15 NOTE — Assessment & Plan Note (Signed)
She is currently rate controlled with metoprolol.  She is on an ASA.

## 2023-07-15 NOTE — Assessment & Plan Note (Signed)
Her BP is currently controlled.  We will continue on her current meds.

## 2023-07-15 NOTE — Assessment & Plan Note (Signed)
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  We will continue with secondary prevention.  She is on a statin and ASA.  She denies any angina at this time.

## 2023-07-15 NOTE — Assessment & Plan Note (Signed)
She has an appointment for cystoscopy coming up with urology.

## 2023-07-15 NOTE — Assessment & Plan Note (Signed)
She cannot exercise due to her mobility with left foot drop.  She uses a motorized wheelchair to get around.  I want her to eat healthy, be active and lose weight.

## 2023-07-15 NOTE — Assessment & Plan Note (Addendum)
She is seen every 6 months by vascular surgery where she has had multiple LE bypasses.  Her bypasses on last visit were patient.  We will continue with risk factor modification and she is on an ASA.  She denies any claudication symptoms.

## 2023-07-15 NOTE — Assessment & Plan Note (Signed)
She has CKD due to her HTN and DM.  We will continue on farxiga and losartan.  She is to avoid NSAIDS.

## 2023-07-18 LAB — CBC WITH DIFFERENTIAL/PLATELET
Basophils Absolute: 0.1 10*3/uL (ref 0.0–0.2)
Basos: 1 %
EOS (ABSOLUTE): 0.2 10*3/uL (ref 0.0–0.4)
Eos: 2 %
Hematocrit: 35.5 % (ref 34.0–46.6)
Hemoglobin: 11.2 g/dL (ref 11.1–15.9)
Immature Grans (Abs): 0.1 10*3/uL (ref 0.0–0.1)
Immature Granulocytes: 1 %
Lymphocytes Absolute: 1.2 10*3/uL (ref 0.7–3.1)
Lymphs: 17 %
MCH: 31.8 pg (ref 26.6–33.0)
MCHC: 31.5 g/dL (ref 31.5–35.7)
MCV: 101 fL — ABNORMAL HIGH (ref 79–97)
Monocytes Absolute: 0.9 10*3/uL (ref 0.1–0.9)
Monocytes: 13 %
Neutrophils Absolute: 4.6 10*3/uL (ref 1.4–7.0)
Neutrophils: 66 %
Platelets: 229 10*3/uL (ref 150–450)
RBC: 3.52 x10E6/uL — ABNORMAL LOW (ref 3.77–5.28)
RDW: 14.4 % (ref 11.7–15.4)
WBC: 7 10*3/uL (ref 3.4–10.8)

## 2023-07-18 LAB — COMPREHENSIVE METABOLIC PANEL
ALT: 16 [IU]/L (ref 0–32)
AST: 17 [IU]/L (ref 0–40)
Albumin: 4.1 g/dL (ref 3.7–4.7)
Alkaline Phosphatase: 166 [IU]/L — ABNORMAL HIGH (ref 44–121)
BUN/Creatinine Ratio: 26 (ref 12–28)
BUN: 45 mg/dL — ABNORMAL HIGH (ref 8–27)
Bilirubin Total: 0.3 mg/dL (ref 0.0–1.2)
CO2: 24 mmol/L (ref 20–29)
Calcium: 9 mg/dL (ref 8.7–10.3)
Chloride: 103 mmol/L (ref 96–106)
Creatinine, Ser: 1.74 mg/dL — ABNORMAL HIGH (ref 0.57–1.00)
Globulin, Total: 3.1 g/dL (ref 1.5–4.5)
Glucose: 182 mg/dL — ABNORMAL HIGH (ref 70–99)
Potassium: 4.2 mmol/L (ref 3.5–5.2)
Sodium: 144 mmol/L (ref 134–144)
Total Protein: 7.2 g/dL (ref 6.0–8.5)
eGFR: 28 mL/min/{1.73_m2} — ABNORMAL LOW (ref >59)

## 2023-07-18 LAB — LIPID PANEL W/O CHOL/HDL RATIO
Cholesterol, Total: 129 mg/dL (ref 100–199)
HDL: 50 mg/dL (ref >39)
LDL Chol Calc (NIH): 48 mg/dL (ref 0–99)
Triglycerides: 188 mg/dL — ABNORMAL HIGH (ref 0–149)
VLDL Cholesterol Cal: 31 mg/dL (ref 5–40)

## 2023-07-18 LAB — HGB A1C W/O EAG: Hgb A1c MFr Bld: 6.3 % — ABNORMAL HIGH (ref 4.8–5.6)

## 2023-07-18 LAB — TSH: TSH: 1.78 u[IU]/mL (ref 0.450–4.500)

## 2023-07-20 ENCOUNTER — Other Ambulatory Visit: Payer: Self-pay | Admitting: Internal Medicine

## 2023-07-20 DIAGNOSIS — Z1231 Encounter for screening mammogram for malignant neoplasm of breast: Secondary | ICD-10-CM

## 2023-07-20 LAB — SPECIMEN STATUS REPORT

## 2023-07-20 LAB — MICROALBUMIN / CREATININE URINE RATIO
Creatinine, Urine: 27 mg/dL
Microalb/Creat Ratio: 181 mg/g{creat} — ABNORMAL HIGH (ref 0–29)
Microalbumin, Urine: 49 ug/mL

## 2023-07-27 NOTE — Progress Notes (Signed)
Cardiology Office Note:  .   Date:  08/10/2023  ID:  Catherine Andrews, DOB [birth year], MRN [id number] PCP: Crist Fat, MD  [facility] Providers Cardiologist:  Dietrich Pates, MD    History of Present Illness: .   Catherine Andrews is a 88 y.o. female   with a history of CAD (Hx NSTEMI 2015; s/p CABG x 2 (2015,redo CABGx3 2016), PAD (s/p L fem-pop bypass and LE stenting), PAF (in setting of dehydration only eliquis stopped due to GI bleed), HTN, HL, DM, lung CA (non-small cell Ca (s/p chemo, followed in [place]) and melanoma  Patient saw Dr. Tenny Craw 05/2022 and SOB felt due to emphysema.  Patient comes in for yearly f/u. Chronic DOE unchanged. No chest pain. Some mornings when she wakes up her heart is racing and pounding-happens when still in bed and lasts less than a minute. No symptoms when up and around.   ROS:    Studies Reviewed: Marland Kitchen    EKG Interpretation Date/Time:  Tuesday August 10 2023 10:37:37 EST Ventricular Rate:  55 PR Interval:  186 QRS Duration:  96 QT Interval:  472 QTC Calculation: 451 R Axis:   -26  Text Interpretation: Sinus bradycardia Minimal voltage criteria for LVH, may be normal variant ( Cornell product ) When compared with ECG of 08-Nov-2020 08:15, Criteria for Inferior infarct are no longer Present Confirmed by Jacolyn Reedy [id number]) on 08/10/2023 10:43:45 AM    Prior CV Studies:     CATH in March 2015:   60% LM; 90% LAD, 50% RCA, 80% d RCA) Patient underwewtn PCI to LAD in 2015  Pt then underwent CABG with LIMA to LAD     In Nov 2015 cath showed; LIMA to apical LAD occluded.   Pt underwent PCI and DES x 2 to RCA.)  In Nov 2016 :Urgent redo  CABG x 3     Risk Assessment/Calculations:    CHA2DS2-VASc Score = 7   This indicates a 11.2% annual risk of stroke. The patient's score is based upon: CHF History: 1 HTN History: 1 Diabetes History: 1 Stroke History: 0 Vascular Disease History: 1 Age Score: 2 Gender Score: 1            Physical Exam:    VS:  BP 138/70 (BP Location: Left Arm, Patient Position: Sitting, Cuff Size: Large)   Pulse (!) 55   Resp 16   Ht 5\' 4"  (1.626 m)   Wt 172 lb (78 kg)   SpO2 93%   BMI 29.52 kg/m    Wt Readings from Last 3 Encounters:  08/10/23 172 lb (78 kg)  07/15/23 171 lb (77.6 kg)  04/08/23 172 lb (78 kg)    GEN: Well nourished, well developed in no acute distress NECK: No JVD; No carotid bruits CARDIAC:  RRR, no murmurs, rubs, gallops RESPIRATORY:  Clear to auscultation without rales, wheezing or rhonchi  ABDOMEN: Soft, non-tender, non-distended EXTREMITIES:  No edema; No deformity   ASSESSMENT AND PLAN: .     Palpitations with history of Afib in the setting of dehydration. Says she was on eliquis for a period of time but thinks it was stopped due to GI bleed in past. Will place 2 week zio. Labs reviewed in care everywhere and stable. Crt 1.98 yest  CAD   Pt with hx CAD,NSTEMI ,redo CABG x 3, 2016 -no chest pain, chronic DOE unchanged   HTN   BP is well controlled      Hx systolic CHF  Last echo at [facility] was normal  Euvolemic    HLD   LDL 48, triglycerides 188 96/2952   PAD   Denies claudication (s/p R fem-pop bypass, s/p PTA, PCI L FA, PTA L CF, stent L SFA, bilateral iliac stenting  Just seen by Lesly Dukes 04/2023 and doing well.   Hx adeno CA lung   Followed in [place] with oncology       CT recently showed stable apppearing ground glass and nodule   Severe emphysema noted      Hx melanoma   s/p excision in 2018   (L shoulder)-another lesion on lower leg to be removed.   DM  A1C 6.3          Dispo: f/u in 1 yr.  Signed, Jacolyn Reedy, PA-C

## 2023-08-10 ENCOUNTER — Ambulatory Visit (INDEPENDENT_AMBULATORY_CARE_PROVIDER_SITE_OTHER): Payer: Medicare Other

## 2023-08-10 ENCOUNTER — Ambulatory Visit: Payer: Medicare Other | Attending: Internal Medicine | Admitting: Physician Assistant

## 2023-08-10 VITALS — BP 138/70 | HR 55 | Resp 16 | Ht 64.0 in | Wt 172.0 lb

## 2023-08-10 DIAGNOSIS — E785 Hyperlipidemia, unspecified: Secondary | ICD-10-CM

## 2023-08-10 DIAGNOSIS — I739 Peripheral vascular disease, unspecified: Secondary | ICD-10-CM

## 2023-08-10 DIAGNOSIS — I5022 Chronic systolic (congestive) heart failure: Secondary | ICD-10-CM | POA: Diagnosis present

## 2023-08-10 DIAGNOSIS — E1142 Type 2 diabetes mellitus with diabetic polyneuropathy: Secondary | ICD-10-CM

## 2023-08-10 DIAGNOSIS — R002 Palpitations: Secondary | ICD-10-CM | POA: Insufficient documentation

## 2023-08-10 DIAGNOSIS — I2510 Atherosclerotic heart disease of native coronary artery without angina pectoris: Secondary | ICD-10-CM | POA: Insufficient documentation

## 2023-08-10 DIAGNOSIS — I480 Paroxysmal atrial fibrillation: Secondary | ICD-10-CM

## 2023-08-10 DIAGNOSIS — C4359 Malignant melanoma of other part of trunk: Secondary | ICD-10-CM | POA: Diagnosis present

## 2023-08-10 NOTE — Patient Instructions (Signed)
Medication Instructions:   Your physician recommends that you continue on your current medications as directed. Please refer to the Current Medication list given to you today.   *If you need a refill on your cardiac medications before your next appointment, please call your pharmacy*   Lab Work:  None ordered.  If you have labs (blood work) drawn today and your tests are completely normal, you will receive your results only by: MyChart Message (if you have MyChart) OR A paper copy in the mail If you have any lab test that is abnormal or we need to change your treatment, we will call you to review the results.   Testing/Procedures:  Christena Deem- Long Term Monitor Instructions  Your physician has requested you wear a ZIO patch monitor for 14 days.  This is a single patch monitor. [employer] supplies one patch monitor per enrollment. Additional stickers are not available. Please do not apply patch if you will be having a Nuclear Stress Test,  Echocardiogram, Cardiac CT, MRI, or Chest Xray during the period you would be wearing the  monitor. The patch cannot be worn during these tests. You cannot remove and re-apply the  ZIO XT patch monitor.  Your ZIO patch monitor will be mailed 3 day USPS to your address on file. It may take 3-5 days  to receive your monitor after you have been enrolled.  Once you have received your monitor, please review the enclosed instructions. Your monitor  has already been registered assigning a specific monitor serial # to you.  Billing and Patient Assistance Program Information  We have supplied [employer] with any of your insurance information on file for billing purposes. [employer] offers a sliding scale Patient Assistance Program for patients that do not have  insurance, or whose insurance does not completely cover the cost of the ZIO monitor.  You must apply for the Patient Assistance Program to qualify for this discounted rate.  To apply, please call [employer] at  [phone], select option 4, select option 2, ask to apply for  Patient Assistance Program. Meredeth Ide will ask your household income, and how many people  are in your household. They will quote your out-of-pocket cost based on that information.  [employer] will also be able to set up a 72-month, interest-free payment plan if needed.  Applying the monitor   Shave hair from upper left chest.  Hold abrader disc by orange tab. Rub abrader in 40 strokes over the upper left chest as  indicated in your monitor instructions.  Clean area with 4 enclosed alcohol pads. Let dry.  Apply patch as indicated in monitor instructions. Patch will be placed under collarbone on left  side of chest with arrow pointing upward.  Rub patch adhesive wings for 2 minutes. Remove white label marked "1". Remove the white  label marked "2". Rub patch adhesive wings for 2 additional minutes.  While looking in a mirror, press and release button in center of patch. A small green light will  flash 3-4 times. This will be your only indicator that the monitor has been turned on.  Do not shower for the first 24 hours. You may shower after the first 24 hours.  Press the button if you feel a symptom. You will hear a small click. Record Date, Time and  Symptom in the Patient Logbook.  When you are ready to remove the patch, follow instructions on the last 2 pages of Patient  Logbook. Stick patch monitor onto the last page of Patient  Logbook.  Place Patient Logbook in the blue and white box. Use locking tab on box and tape box closed  securely. The blue and white box has prepaid postage on it. Please place it in the mailbox as  soon as possible. Your physician should have your test results approximately 7 days after the  monitor has been mailed back to [facility].  Call [facility] Customer Care at [phone] if you have questions regarding  your ZIO XT patch monitor. Call them immediately if you see an orange light  blinking on your  monitor.  If your monitor falls off in less than 4 days, contact our Monitor department at [phone].  If your monitor becomes loose or falls off after 4 days call [employer] at [phone] for  suggestions on securing your monitor    Follow-Up: At [facility], you and your health needs are our priority.  As part of our continuing mission to provide you with exceptional heart care, we have created designated Provider Care Teams.  These Care Teams include your primary Cardiologist (physician) and Advanced Practice Providers (APPs -  Physician Assistants and Nurse Practitioners) who all work together to provide you with the care you need, when you need it.  We recommend signing up for the patient portal called "MyChart".  Sign up information is provided on this After Visit Summary.  MyChart is used to connect with patients for Virtual Visits (Telemedicine).  Patients are able to view lab/test results, encounter notes, upcoming appointments, etc.  Non-urgent messages can be sent to your provider as well.   To learn more about what you can do with MyChart, go to [email or web address].    Your next appointment:   6 month(s)  Provider:   Dietrich Pates, MD     Other Instructions  Your physician wants you to follow-up in: 6 months.  You will receive a reminder letter in the mail two months in advance. If you don't receive a letter, please call our office to schedule the follow-up appointment.

## 2023-08-10 NOTE — Progress Notes (Unsigned)
Applied a 14 day Zio XT monitor to patient in the office  Ross to read

## 2023-08-10 NOTE — Progress Notes (Signed)
She is spilling out protein and she has Stage IIIb CKD. I think she needs to be seen by nephrology, please refer,    Patient is aware of Lab results. And also referral to Nephrology

## 2023-08-10 NOTE — Progress Notes (Signed)
Her labs look good. She needs to drink plenty of water. Her CKD is overall stable.   Patient is aware of labs

## 2023-08-16 ENCOUNTER — Ambulatory Visit: Payer: Medicare Other | Admitting: Internal Medicine

## 2023-08-26 ENCOUNTER — Ambulatory Visit
Admission: RE | Admit: 2023-08-26 | Discharge: 2023-08-26 | Disposition: A | Discharge status: Home / Self Care | Payer: Medicare Other | Source: Ambulatory Visit | Attending: Internal Medicine | Admitting: Internal Medicine

## 2023-08-26 DIAGNOSIS — Z1231 Encounter for screening mammogram for malignant neoplasm of breast: Secondary | ICD-10-CM

## 2023-09-12 IMAGING — MG MM DIGITAL SCREENING BILAT W/ TOMO AND CAD
8 of 14 series · 9 of 40 positions shown · non-contrast
Comparison: Previous exam(s).

CLINICAL DATA: Screening.

EXAM:
DIGITAL SCREENING BILATERAL MAMMOGRAM WITH TOMOSYNTHESIS AND CAD
TECHNIQUE: Bilateral screening digital craniocaudal and mediolateral oblique
mammograms were obtained. Bilateral screening digital breast
tomosynthesis was performed. The images were evaluated with
computer-aided detection.

[R MLO synth-2D (1 of 2)]
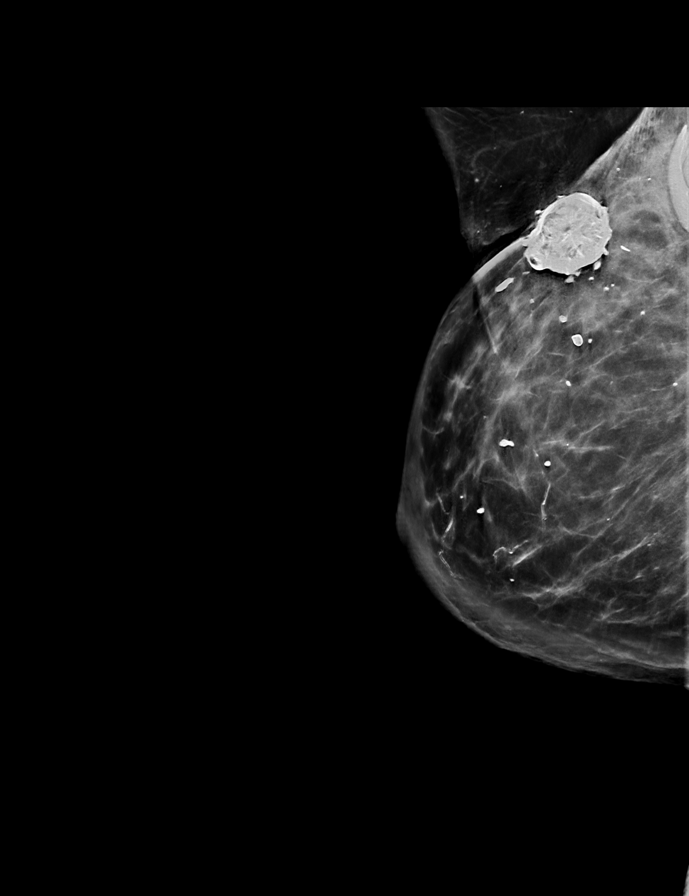

[R CC synth-2D (1 of 2)]
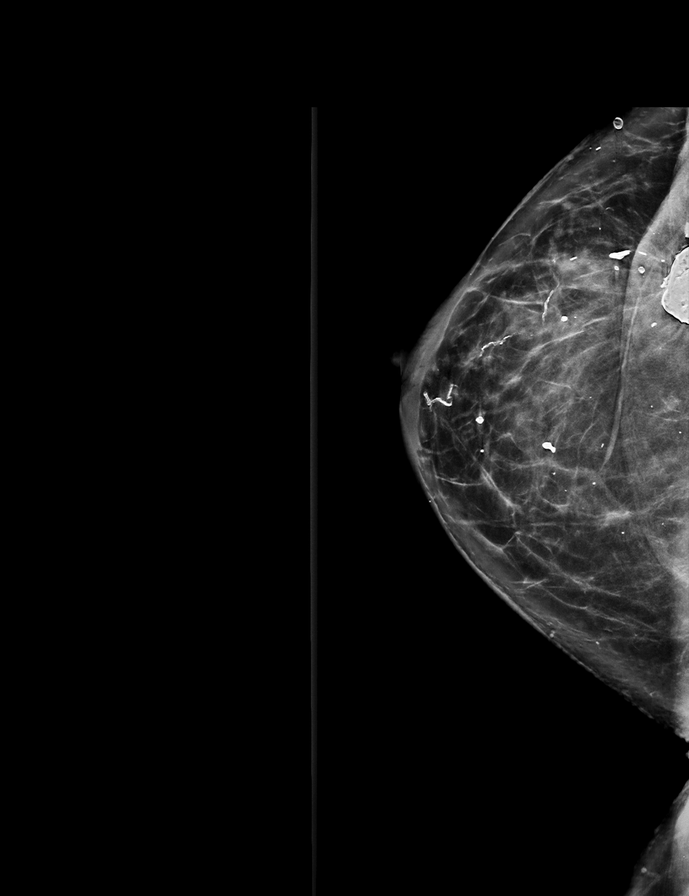

[L MLO synth-2D (1 of 2)]
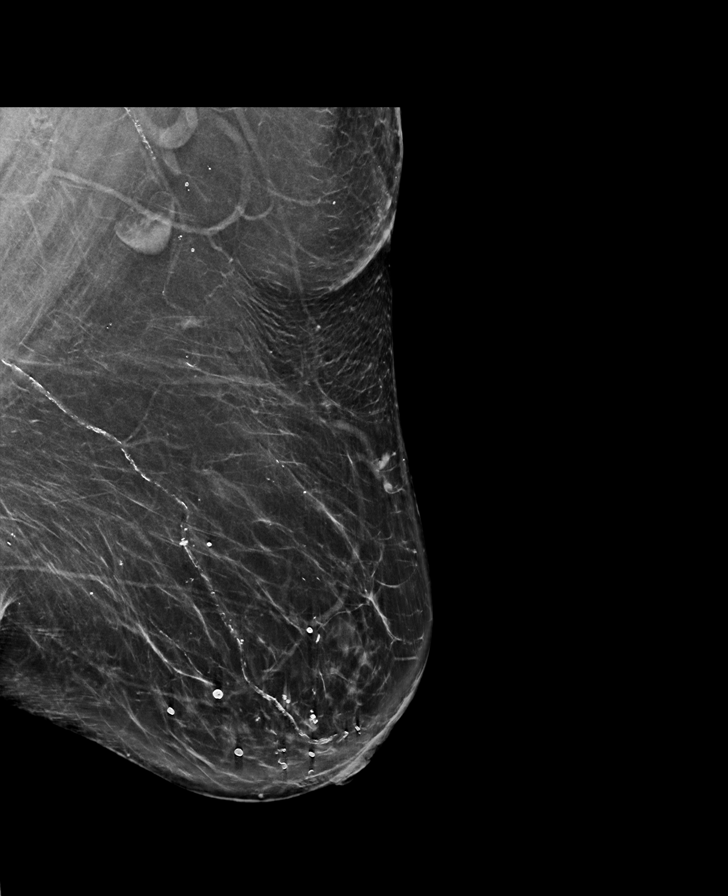

[L MLO synth-2D (2 of 2)]
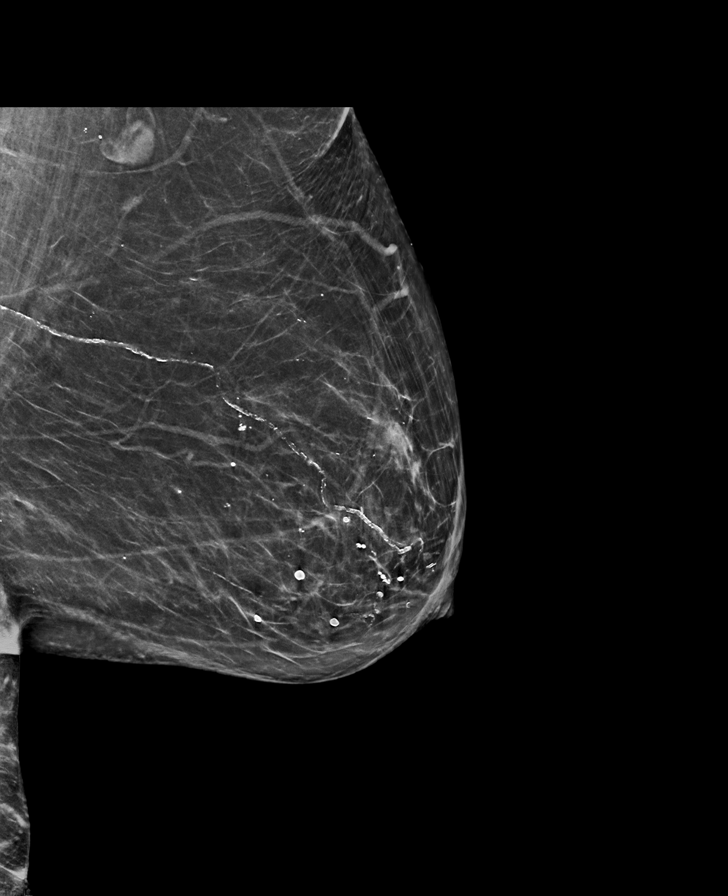

[L CC synth-2D]
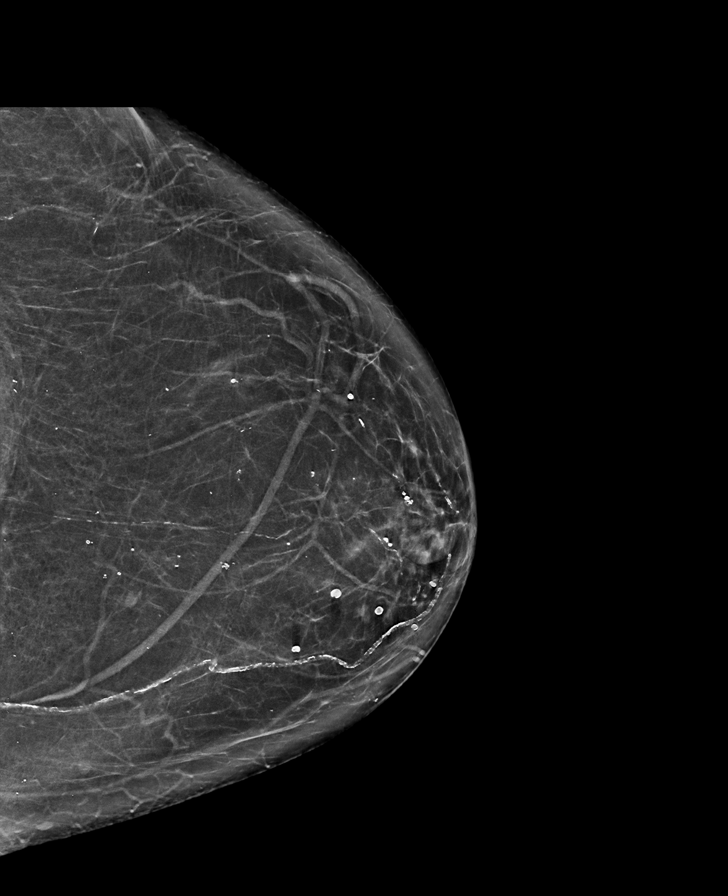

[R MLO synth-2D (2 of 2)]
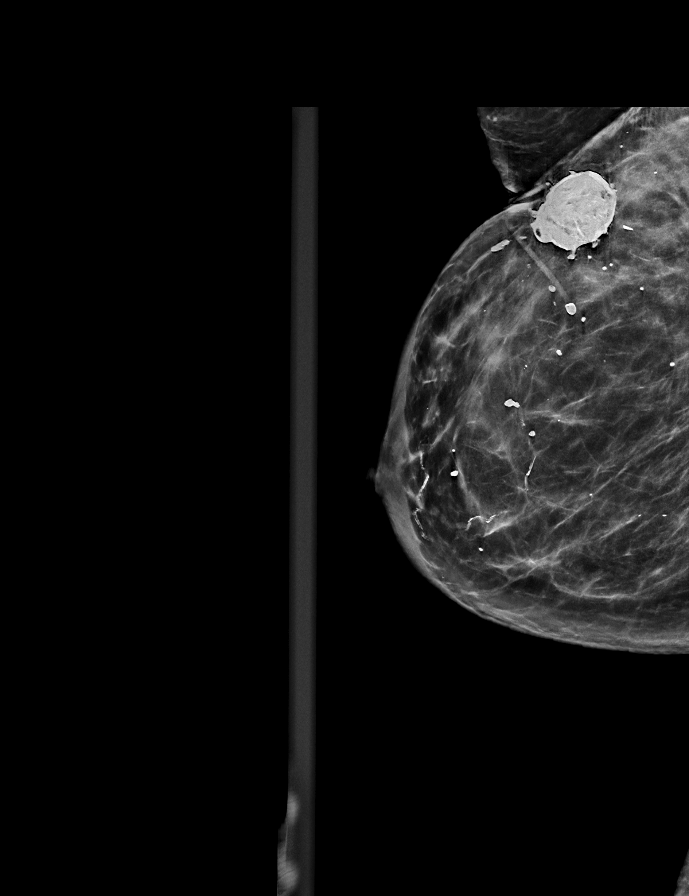

[R CC synth-2D (2 of 2)]
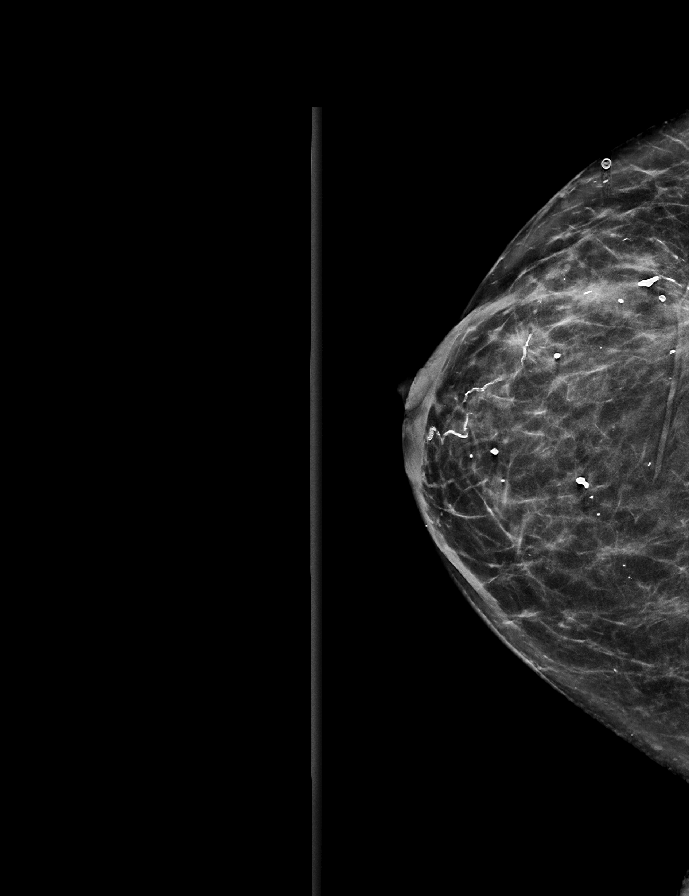

[R CC tomo · 2 of 59 frames shown]
[frame 20/59]
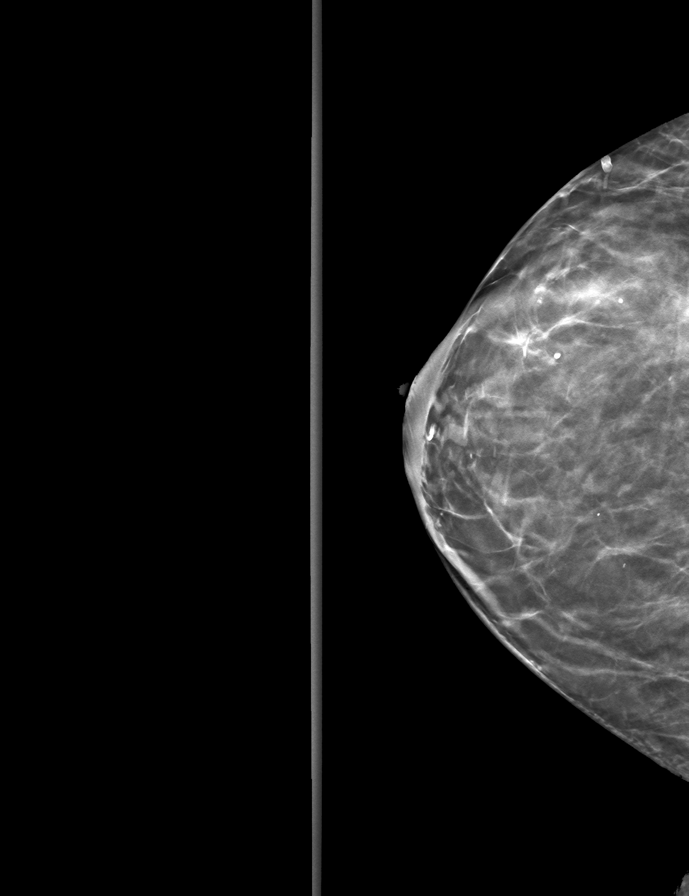
[frame 30/59]
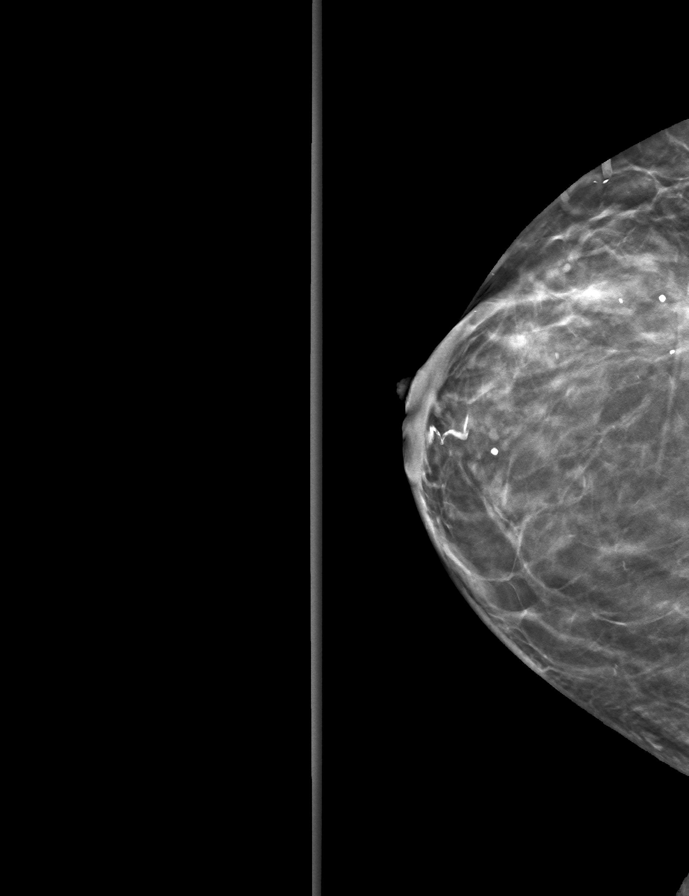

[9 of 40 positions shown; findings below may reference images not displayed]

ACR Breast Density Category b: There are scattered areas of
fibroglandular density.
FINDINGS: There are no findings suspicious for malignancy.
IMPRESSION: No mammographic evidence of malignancy. A result letter of this
screening mammogram will be mailed directly to the patient.

RECOMMENDATION:
Screening mammogram in one year. (Code:51-O-LD2)

BI-RADS CATEGORY  1: Negative.

## 2023-09-20 ENCOUNTER — Other Ambulatory Visit: Payer: Self-pay | Admitting: Internal Medicine

## 2023-10-07 ENCOUNTER — Other Ambulatory Visit: Payer: Self-pay | Admitting: Nephrology

## 2023-10-07 DIAGNOSIS — N184 Chronic kidney disease, stage 4 (severe): Secondary | ICD-10-CM

## 2023-10-07 DIAGNOSIS — R809 Proteinuria, unspecified: Secondary | ICD-10-CM

## 2023-10-13 ENCOUNTER — Ambulatory Visit
Admission: RE | Admit: 2023-10-13 | Discharge: 2023-10-13 | Disposition: A | Discharge status: Home / Self Care | Payer: Medicare Other | Source: Ambulatory Visit | Attending: Nephrology | Admitting: Nephrology

## 2023-10-13 DIAGNOSIS — N184 Chronic kidney disease, stage 4 (severe): Secondary | ICD-10-CM

## 2023-10-13 DIAGNOSIS — R809 Proteinuria, unspecified: Secondary | ICD-10-CM

## 2023-10-14 ENCOUNTER — Other Ambulatory Visit: Payer: Self-pay | Admitting: Internal Medicine

## 2023-10-14 ENCOUNTER — Encounter: Payer: Self-pay | Admitting: Internal Medicine

## 2023-10-14 ENCOUNTER — Ambulatory Visit: Payer: Medicare Other | Admitting: Internal Medicine

## 2023-10-14 VITALS — BP 124/60 | HR 66 | Temp 97.8°F | Resp 18 | Wt 184.0 lb

## 2023-10-14 DIAGNOSIS — N1831 Chronic kidney disease, stage 3a: Secondary | ICD-10-CM | POA: Diagnosis not present

## 2023-10-14 DIAGNOSIS — E1121 Type 2 diabetes mellitus with diabetic nephropathy: Secondary | ICD-10-CM | POA: Diagnosis not present

## 2023-10-14 DIAGNOSIS — I10 Essential (primary) hypertension: Secondary | ICD-10-CM | POA: Diagnosis not present

## 2023-10-14 MED ORDER — FLUTICASONE PROPIONATE 50 MCG/ACT NA SUSP: 1.0000 | Freq: Two times a day (BID) | NASAL | 0 refills | Status: DC

## 2023-10-14 MED ORDER — FLUTICASONE PROPIONATE 50 MCG/ACT NA SUSP: 1.0000 | Freq: Two times a day (BID) | NASAL | 0 refills | Status: AC

## 2023-10-14 NOTE — Progress Notes (Signed)
 Office Visit  Subjective   Patient ID: Catherine Andrews   DOB: [birth year]   Age: 89 y.o.   MRN: [id number]   Chief Complaint No chief complaint on file.    History of Present Illness The patient is a 89 year old Caucasian/White female who returns for a follow-up visit for her T2 diabetes. She was diagnosed with diabetes about 40 years ago. This past year, her A1c was a bit elevated but we made no changes as she had her insulin  recently changed at that time.  She remains on Tresiba 48 Units daily (she was previously on 50 Units daily) and novolog  SSI.  She takes around 18-22 Units of novolog  sliding scale when she uses it.  She states she takes the sliding scale 3 times per day.  She has been on metformin in the past but this has caused diarrhea.  She is not walking as much as they would like. She specifically denies unexplained abdominal pain, nausea or vomiting and documented hypoglycemia.  She states her freestyle herlene will alert her with low blood sugars first thing before breakfast maybe once per week.  She eats to fix this.  She checks blood sugars with a Freestyle libre and she states her blood sugars are ranging 90-150.  Her last HgBa1c was done 3 months ago and was 6.3%.  She came in fasting today in anticipation of lab work. She does not have any long term problems with diabetic neuropathy or retinopathy. She does have a history of diabetic with circulatory problems with PAD and CAD. It also looks like she has some diabetic nephropathy with Stage IIIa CKD.  The patient had a dilated eye exam done on 07/09/2023 done by Dr. McCuen at [facility] which there was no diabetic retinopathy.   She also has a history of Stage IIIb CKD which is probably due to her diabetes and HTN.  Her creatinine has been running 1-1.1 over the last 2 years with a GFR of 32-60.  She is currently on losartan  and farxiga  10mg  daily.  She denies any NSAIDS use.  She seen Bhandari on 10/05/2023 where her kidney  function has been stable.  She also had a renal US  done on 10/12/2022 and this showed some cysts and medical renal disease.   The patient is a 89 year old Caucasian/White female who presents for a follow-up evaluation of hypertension.  This past year, her BP was not controlled and we increased her losartan  from 50mg  daily to 100mg  daily.  The patient has been checking her blood pressure at home. The patient's blood pressure has ranged systollically in the 120-130's range. The patient's current medications include: amlodipine  10mg  daily, Lasix  40mg  every other day, hydralazine  50mg  TID, and losartan  100mg  daily.  The patient has been tolerating her medications well. The patient denies any headache, visual changes, dizziness, lightheadness, chest pain, shortness of breath, weakness/numbness, and edema.       Past Medical History Past Medical History:  Diagnosis Date   Anemia    Anesthesia complication    Arthritis    Breast cancer (HCC)    Right Breast- 20 yrs ago   CAD (coronary artery disease)    CHF (congestive heart failure) (HCC)    Colon polyp    Constipation    Depression    Esophageal reflux    Fever    Foot drop    High cholesterol    HTN (hypertension)    Hyperlipemia    Hypertension  Lung cancer (HCC)    Melanoma (HCC)    MI (myocardial infarction) (HCC)    Paroxysmal atrial fibrillation (HCC)    Peripheral arterial disease (HCC)    Skin ulcer of chin (HCC)    SOB (shortness of breath)    Syncope and collapse    Uses hearing aid    Vertigo    Wears partial dentures      Allergies Allergies  Allergen Reactions   Codeine Other (See Comments)    Other reaction(s): HALLUCINATIONS Other reaction(s): HALLUCINATIONS    Latex Itching, Hives and Rash    Per patient break outs  adhesives   Losartan      Other reaction(s): decline gfr   Metformin Hcl     Other reaction(s): diarrhea     Medications  Current Outpatient Medications:    acetaminophen  (TYLENOL ) 325  MG tablet, Take 325 mg by mouth every 6 (six) hours as needed for moderate pain., Disp: , Rfl:    ADMELOG SOLOSTAR 100 UNIT/ML KwikPen, INJECT 18 UNITS SUBQ FOR SUGAR 150, 22 UNITS FOR SUGAR 151-200, 26 UNITS FOR 201-250, 30 UNITS FOR 251-300 THREE TIMES A DAY *EXPIRES 28 DAYS AFTER OPENING*, Disp: 30 mL, Rfl: 10   amLODipine  (NORVASC ) 10 MG tablet, TAKE 1 TABLET BY MOUTH EVERY EVENING FOR BLOOD PRESSURE, Disp: 90 tablet, Rfl: 2   aspirin  81 MG chewable tablet, Chew 81 mg by mouth every evening., Disp: , Rfl:    atorvastatin  (LIPITOR) 40 MG tablet, TAKE 1 TABLET BY MOUTH ONCE DAILY FOR CHOLESTEROL, Disp: 90 tablet, Rfl: 2   BD PEN NEEDLE NANO 2ND GEN 32G X 4 MM MISC, , Disp: , Rfl:    buPROPion  (WELLBUTRIN  XL) 150 MG 24 hr tablet, Take 150 mg by mouth every morning., Disp: , Rfl:    Cholecalciferol  125 MCG (5000 UT) TABS, , Disp: , Rfl:    Cyanocobalamin (VITAMIN B 12 PO), Take 1 tablet by mouth daily., Disp: , Rfl:    FARXIGA  10 MG TABS tablet, TAKE 1 TABLET BY MOUTH EVERY MORNING, Disp: 30 tablet, Rfl: 10   furosemide  (LASIX ) 40 MG tablet, TAKE 1 TABLET BY MOUTH EVERY OTHER DAY WITH POTASSIUM, Disp: 45 tablet, Rfl: 2   glucose blood (ACCU-CHEK AVIVA PLUS) test strip, 1 strip by Miscellaneous route 3 (three) times a day before meals., Disp: , Rfl:    hydrALAZINE  (APRESOLINE ) 50 MG tablet, TAKE 1 TABLET BY MOUTH THREE TIMES A DAY WITH FOOD, Disp: 270 tablet, Rfl: 2   losartan  (COZAAR ) 100 MG tablet, Take 1 tablet (100 mg total) by mouth daily., Disp: 90 tablet, Rfl: 4   metoprolol  succinate (TOPROL -XL) 50 MG 24 hr tablet, TAKE 1 & 1/2 TABLET = 75 MG BY MOUTH ONCE DAILY, Disp: 135 tablet, Rfl: 2   Multiple Vitamins-Minerals (PRESERVISION AREDS) CAPS, Take 1 capsule by mouth in the morning and at bedtime., Disp: , Rfl:    omeprazole (PRILOSEC) 20 MG capsule, TAKE 1 CAPSULE BY MOUTH EVERY MORNING 30 MINUTES BEFORE MORNING MEAL *DO NOT CRUSH OR CHEW*, Disp: 90 capsule, Rfl: 2   potassium chloride   (KLOR-CON ) 10 MEQ tablet, TAKE ONE TABLET EVERY OTHER DAY WITH YOUR LASIX , Disp: 90 tablet, Rfl: 3   traZODone  (DESYREL ) 50 MG tablet, TAKE 1/2 TABLET = 25 MG BY MOUTH EVERY NIGHT AT BEDTIME, Disp: 45 tablet, Rfl: 2   TRESIBA FLEXTOUCH 100 UNIT/ML FlexTouch Pen, Inject 48 Units into the skin at bedtime., Disp: , Rfl:    venlafaxine  XR (EFFEXOR -XR) 75 MG 24  hr capsule, TAKE 1 CAPSULE BY MOUTH ONCE DAILY *TAKE WITH FOOD* *DO NOT CRUSH OR CHEW*, Disp: 90 capsule, Rfl: 2   Review of Systems Review of Systems  Constitutional:  Negative for chills and fever.  Eyes:  Negative for blurred vision and double vision.  Respiratory:  Negative for cough and shortness of breath.   Cardiovascular:  Negative for chest pain, palpitations and leg swelling.  Gastrointestinal:  Negative for abdominal pain, constipation, diarrhea, nausea and vomiting.  Genitourinary:  Negative for frequency.  Musculoskeletal:  Negative for myalgias.  Skin:  Negative for itching and rash.  Neurological:  Negative for dizziness, weakness and headaches.  Endo/Heme/Allergies:  Negative for polydipsia.       Objective:    Vitals There were no vitals taken for this visit.   Physical Examination Physical Exam Constitutional:      Appearance: Normal appearance. She is not ill-appearing.  Cardiovascular:     Rate and Rhythm: Normal rate and regular rhythm.     Pulses: Normal pulses.     Heart sounds: No murmur heard.    No friction rub. No gallop.  Pulmonary:     Effort: Pulmonary effort is normal. No respiratory distress.     Breath sounds: No wheezing, rhonchi or rales.  Abdominal:     General: Bowel sounds are normal. There is no distension.     Palpations: Abdomen is soft.     Tenderness: There is no abdominal tenderness.  Musculoskeletal:     Right lower leg: No edema.     Left lower leg: No edema.  Skin:    General: Skin is warm and dry.     Findings: No rash.  Neurological:     General: No focal deficit  present.     Mental Status: She is alert and oriented to person, place, and time.  Psychiatric:        Mood and Affect: Mood normal.        Behavior: Behavior normal.        Assessment & Plan:   No problem-specific Assessment & Plan notes found for this encounter.    No follow-ups on file.   Selinda Fleeta Finger, MD

## 2023-10-14 NOTE — Assessment & Plan Note (Signed)
 She has Stage IIIa CKD.  I will obtian Dr. Roma Close notes.  She is to avoid nephrotoxins including NSAIDS.  Her urine studies from this past year where reveiwed.

## 2023-10-14 NOTE — Assessment & Plan Note (Signed)
 Her A1c was controlled on her last visit.  Neprhology did obtain some labs and I do not have the results.  We will hold off on obtaining labs this visit and repeat labs on her next visit.  Her diabetes looks controlled.

## 2023-10-14 NOTE — Assessment & Plan Note (Signed)
 Her BP is well controlled on her current medications.  We will continue without changes and continue to monitor her BP.

## 2023-10-28 ENCOUNTER — Other Ambulatory Visit: Payer: Self-pay

## 2023-10-28 ENCOUNTER — Other Ambulatory Visit: Payer: Self-pay | Admitting: Internal Medicine

## 2023-10-28 MED ORDER — BUPROPION HCL ER (XL) 150 MG PO TB24: 150.0000 mg | ORAL_TABLET | Freq: Every day | ORAL | 2 refills | Status: AC

## 2023-11-03 ENCOUNTER — Other Ambulatory Visit: Payer: Self-pay

## 2023-11-03 MED ORDER — BASAGLAR KWIKPEN 100 UNIT/ML ~~LOC~~ SOPN: 40.0000 [IU] | PEN_INJECTOR | Freq: Every day | SUBCUTANEOUS | 1 refills | Status: DC

## 2023-11-30 ENCOUNTER — Other Ambulatory Visit: Payer: Self-pay | Admitting: Internal Medicine

## 2023-12-02 ENCOUNTER — Ambulatory Visit: Admitting: Internal Medicine

## 2023-12-02 ENCOUNTER — Other Ambulatory Visit: Payer: Self-pay | Admitting: Internal Medicine

## 2023-12-02 ENCOUNTER — Encounter: Payer: Self-pay | Admitting: Internal Medicine

## 2023-12-02 ENCOUNTER — Other Ambulatory Visit: Payer: Self-pay

## 2023-12-02 VITALS — BP 122/60 | HR 64 | Temp 98.0°F | Resp 18 | Ht 64.0 in | Wt 170.0 lb

## 2023-12-02 DIAGNOSIS — R82998 Other abnormal findings in urine: Secondary | ICD-10-CM | POA: Diagnosis not present

## 2023-12-02 DIAGNOSIS — N390 Urinary tract infection, site not specified: Secondary | ICD-10-CM | POA: Insufficient documentation

## 2023-12-02 DIAGNOSIS — B379 Candidiasis, unspecified: Secondary | ICD-10-CM

## 2023-12-02 LAB — POCT URINALYSIS DIPSTICK
Bilirubin, UA: NEGATIVE
Blood, UA: NEGATIVE
Glucose, UA: NEGATIVE
Ketones, UA: POSITIVE
Nitrite, UA: NEGATIVE
Protein, UA: POSITIVE — AB
Spec Grav, UA: 1.01 (ref 1.010–1.025)
Urobilinogen, UA: 0.2 U/dL
pH, UA: 5 (ref 5.0–8.0)

## 2023-12-02 MED ORDER — CIPROFLOXACIN HCL 500 MG PO TABS
500.0000 mg | ORAL_TABLET | Freq: Two times a day (BID) | ORAL | 0 refills | Status: AC
Start: 2023-12-02 — End: 2023-12-07

## 2023-12-02 MED ORDER — FLUCONAZOLE 150 MG PO TABS: ORAL_TABLET | ORAL | 0 refills | Status: DC

## 2023-12-02 NOTE — Addendum Note (Signed)
 Addended by: Crist Fat on: 12/02/2023 03:17 PM   Modules accepted: Orders

## 2023-12-02 NOTE — Progress Notes (Addendum)
 Office Visit  Subjective   Patient ID: Catherine Andrews   DOB: 11-26-1934   Age: 89 y.o.   MRN: [id number]   Chief Complaint Chief Complaint  Patient presents with   office visit    Yeast infection      History of Present Illness Catherine Andrews is a 89 yo female who comes in for an acute visit for urinary symptoms.  She states her symptoms began 5 days ago with itching and then she developed dysuria.  She denies any fever, chills, urinary frequency, hematuria, nausea, vomiting, abd/back/flank pain and no vaginal discharge.  She thought she had a yeast infection and started an OTC cream.     Past Medical History Past Medical History:  Diagnosis Date   Anemia    Anesthesia complication    Arthritis    Breast cancer (HCC)    Right Breast- 20 yrs ago   CAD (coronary artery disease)    CHF (congestive heart failure) (HCC)    Colon polyp    Constipation    Depression    Esophageal reflux    Fever    Foot drop    High cholesterol    HTN (hypertension)    Hyperlipemia    Hypertension    Lung cancer (HCC)    Melanoma (HCC)    MI (myocardial infarction) (HCC)    Paroxysmal atrial fibrillation (HCC)    Peripheral arterial disease (HCC)    Skin ulcer of chin (HCC)    SOB (shortness of breath)    Syncope and collapse    Uses hearing aid    Vertigo    Wears partial dentures      Allergies Allergies  Allergen Reactions   Codeine Other (See Comments)    Other reaction(s): HALLUCINATIONS Other reaction(s): HALLUCINATIONS    Latex Itching, Hives and Rash    Per patient break outs  adhesives   Losartan     Other reaction(s): decline gfr   Metformin Hcl     Other reaction(s): diarrhea     Medications  Current Outpatient Medications:    ciprofloxacin (CIPRO) 500 MG tablet, Take 1 tablet (500 mg total) by mouth 2 (two) times daily for 5 days., Disp: 10 tablet, Rfl: 0   fluconazole (DIFLUCAN) 150 MG tablet, Give one tab po x 1 once you are done with cipro, Disp: 1 tablet,  Rfl: 0   acetaminophen (TYLENOL) 325 MG tablet, Take 325 mg by mouth every 6 (six) hours as needed for moderate pain., Disp: , Rfl:    ADMELOG SOLOSTAR 100 UNIT/ML KwikPen, INJECT 18 UNITS SUBQ FOR SUGAR 150, 22 UNITS FOR SUGAR 151-200, 26 UNITS FOR 201-250, 30 UNITS FOR 251-300 THREE TIMES A DAY *EXPIRES 28 DAYS AFTER OPENING*, Disp: 30 mL, Rfl: 10   amLODipine (NORVASC) 10 MG tablet, TAKE 1 TABLET BY MOUTH EVERY EVENING FOR BLOOD PRESSURE, Disp: 90 tablet, Rfl: 2   aspirin 81 MG chewable tablet, Chew 81 mg by mouth every evening., Disp: , Rfl:    atorvastatin (LIPITOR) 10 MG tablet, TAKE 1 TABLET BY MOUTH ONCE DAILY FOR CHOLESTEROL, Disp: 90 tablet, Rfl: 2   atorvastatin (LIPITOR) 40 MG tablet, TAKE 1 TABLET BY MOUTH ONCE DAILY FOR CHOLESTEROL, Disp: 90 tablet, Rfl: 2   BD PEN NEEDLE NANO 2ND GEN 32G X 4 MM MISC, , Disp: , Rfl:    buPROPion (WELLBUTRIN XL) 150 MG 24 hr tablet, Take 1 tablet (150 mg total) by mouth daily., Disp: 90 tablet, Rfl: 2  Cholecalciferol 125 MCG (5000 UT) TABS, , Disp: , Rfl:    Cyanocobalamin (VITAMIN B 12 PO), Take 1 tablet by mouth daily., Disp: , Rfl:    FARXIGA 10 MG TABS tablet, TAKE 1 TABLET BY MOUTH EVERY MORNING, Disp: 30 tablet, Rfl: 10   fluticasone (FLONASE) 50 MCG/ACT nasal spray, Place 1 spray into both nostrils in the morning and at bedtime., Disp: 15.8 mL, Rfl: 0   furosemide (LASIX) 40 MG tablet, TAKE 1 TABLET BY MOUTH EVERY OTHER DAY WITH POTASSIUM, Disp: 45 tablet, Rfl: 2   glucose blood (ACCU-CHEK AVIVA PLUS) test strip, 1 strip by Miscellaneous route 3 (three) times a day before meals., Disp: , Rfl:    hydrALAZINE (APRESOLINE) 50 MG tablet, TAKE 1 TABLET BY MOUTH THREE TIMES A DAY WITH FOOD, Disp: 270 tablet, Rfl: 2   Insulin Glargine (BASAGLAR KWIKPEN) 100 UNIT/ML, Inject 40 Units into the skin daily., Disp: 3 mL, Rfl: 1   losartan (COZAAR) 100 MG tablet, Take 1 tablet (100 mg total) by mouth daily., Disp: 90 tablet, Rfl: 4   metoprolol succinate  (TOPROL-XL) 50 MG 24 hr tablet, TAKE 1 & 1/2 TABLET = 75 MG BY MOUTH ONCE DAILY, Disp: 135 tablet, Rfl: 2   Multiple Vitamins-Minerals (PRESERVISION AREDS) CAPS, Take 1 capsule by mouth in the morning and at bedtime., Disp: , Rfl:    omeprazole (PRILOSEC) 20 MG capsule, TAKE 1 CAPSULE BY MOUTH EVERY MORNING 30 MINUTES BEFORE MORNING MEAL *DO NOT CRUSH OR CHEW*, Disp: 90 capsule, Rfl: 2   potassium chloride (KLOR-CON) 10 MEQ tablet, TAKE ONE TABLET EVERY OTHER DAY WITH YOUR LASIX, Disp: 90 tablet, Rfl: 3   traZODone (DESYREL) 50 MG tablet, TAKE 1/2 TABLET = 25 MG BY MOUTH EVERY NIGHT AT BEDTIME, Disp: 45 tablet, Rfl: 2   venlafaxine XR (EFFEXOR-XR) 75 MG 24 hr capsule, TAKE 1 CAPSULE BY MOUTH ONCE DAILY *TAKE WITH FOOD* *DO NOT CRUSH OR CHEW*, Disp: 90 capsule, Rfl: 2   Review of Systems Review of Systems  Constitutional:  Negative for chills and fever.  Gastrointestinal:  Negative for nausea and vomiting.  Genitourinary:  Positive for dysuria. Negative for flank pain, frequency, hematuria and urgency.  Skin:  Negative for rash.  Neurological:  Negative for dizziness and weakness.       Objective:    Vitals BP 122/60 (BP Location: Left Arm, Patient Position: Sitting, Cuff Size: Normal)   Pulse 64   Temp 98 F (36.7 C)   Resp 18   Ht 5\' 4"  (1.626 m)   Wt 170 lb (77.1 kg)   SpO2 97%   BMI 29.18 kg/m    Physical Examination Physical Exam Constitutional:      Appearance: Normal appearance. She is not ill-appearing.  Cardiovascular:     Rate and Rhythm: Normal rate and regular rhythm.     Pulses: Normal pulses.     Heart sounds: No murmur heard.    No friction rub. No gallop.  Pulmonary:     Effort: Pulmonary effort is normal. No respiratory distress.     Breath sounds: No wheezing, rhonchi or rales.  Abdominal:     General: Bowel sounds are normal. There is no distension.     Palpations: Abdomen is soft.     Tenderness: There is no abdominal tenderness.  Musculoskeletal:      Right lower leg: No edema.     Left lower leg: No edema.  Skin:    General: Skin is warm and dry.  Findings: No rash.  Neurological:     Mental Status: She is alert.        Assessment & Plan:   Urinary tract infection without hematuria Her UA was positive today.  I am going to send for culture.  She goes Monday for cystocopy for her bladder.  I am going to emipircally start her on cipro.    No follow-ups on file.   Crist Fat, MD

## 2023-12-02 NOTE — Assessment & Plan Note (Signed)
 Her UA was positive today.  I am going to send for culture.  She goes Monday for cystocopy for her bladder.  I am going to emipircally start her on cipro.

## 2023-12-04 LAB — URINE CULTURE

## 2024-01-13 ENCOUNTER — Other Ambulatory Visit: Payer: Self-pay | Admitting: Internal Medicine

## 2024-01-13 ENCOUNTER — Encounter: Payer: Self-pay | Admitting: Internal Medicine

## 2024-01-13 ENCOUNTER — Ambulatory Visit: Payer: Medicare Other | Admitting: Internal Medicine

## 2024-01-13 VITALS — BP 122/60 | HR 59 | Temp 97.6°F | Resp 18 | Ht 64.0 in | Wt 171.0 lb

## 2024-01-13 DIAGNOSIS — E1159 Type 2 diabetes mellitus with other circulatory complications: Secondary | ICD-10-CM | POA: Diagnosis not present

## 2024-01-13 DIAGNOSIS — N1832 Chronic kidney disease, stage 3b: Secondary | ICD-10-CM | POA: Diagnosis not present

## 2024-01-13 DIAGNOSIS — I10 Essential (primary) hypertension: Secondary | ICD-10-CM

## 2024-01-13 DIAGNOSIS — I25810 Atherosclerosis of coronary artery bypass graft(s) without angina pectoris: Secondary | ICD-10-CM

## 2024-01-13 DIAGNOSIS — E1121 Type 2 diabetes mellitus with diabetic nephropathy: Secondary | ICD-10-CM | POA: Diagnosis not present

## 2024-01-13 DIAGNOSIS — Z794 Long term (current) use of insulin: Secondary | ICD-10-CM

## 2024-01-13 DIAGNOSIS — E119 Type 2 diabetes mellitus without complications: Secondary | ICD-10-CM | POA: Insufficient documentation

## 2024-01-13 NOTE — Assessment & Plan Note (Signed)
 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  She is on an ARB and farxiga.  She goes to see nephrology in 2 weeks.  She is to avoid NSAIDS.

## 2024-01-13 NOTE — Assessment & Plan Note (Signed)
 She has had a MI in the past and CABG.  Currently she denies any angina.  We will conitnue with secondary prevention.  She is on a BB, ARB, statin and ASA.

## 2024-01-13 NOTE — Assessment & Plan Note (Signed)
 Her BP is well controlled.  We will continue her current medications.

## 2024-01-13 NOTE — Progress Notes (Signed)
 Office Visit  Subjective   Patient ID: Catherine Andrews   DOB: 1935/08/20   Age: 89 y.o.   MRN: [id number]   Chief Complaint Chief Complaint  Patient presents with   Follow-up    3 month follow up     History of Present Illness The patient is a 89 year old Caucasian/White female who returns for a follow-up visit for her T2 diabetes. She was diagnosed with diabetes about 40 years ago. This past year, her A1c was a bit elevated but we made no changes as she had her insulin  recently changed at that time.  Her insurance changed her from tresiba to basalgar.  She is currently on basalgar 40 Units daily and ademlog SSI.  She takes around 18-22 Units of short acting sliding scale when she uses it.  She states she takes the sliding scale 3 times per day.  She has been on metformin in the past but this has caused diarrhea.  She is not walking as much as they would like. She specifically denies unexplained abdominal pain, nausea or vomiting and documented hypoglycemia.  She checks blood sugars with a Freestyle libre and she states her blood sugars are ranging140-180.  Her last HgBa1c was done 6 months ago and was 6.3%.  She came in fasting today in anticipation of lab work. She does not have any long term problems with diabetic neuropathy or retinopathy. She does have a history of diabetic with circulatory problems with PAD and CAD. It also looks like she has some diabetic nephropathy with Stage IIIa CKD.  The patient had a dilated eye exam done on 07/09/2023 done by Dr. McCuen at [facility] which there was no diabetic retinopathy.  She states she has some macular degeneration and she is now receiving eye injections.   She also has a history of Stage IIIb CKD which is probably due to her diabetes and HTN.  Her creatinine has been running 1-1.1 over the last 2 years with a GFR of 32-60.  She is currently on losartan  and farxiga 10mg  daily.  She denies any NSAIDS use.  She seen [facility] on 10/05/2023  where her kidney function has been stable.  She also had a renal US  done on 10/12/2022 and this showed some cysts and medical renal disease.   The patient is a 89 year old Caucasian/White female who presents for a follow-up evaluation of hypertension.  This past year, her BP was not controlled and we increased her losartan  from 50mg  daily to 100mg  daily.  The patient has been checking her blood pressure at home. The patient's blood pressure has ranged systollically in the 120-130's range. The patient's current medications include: amlodipine  10mg  daily, Lasix  40mg  every other day, hydralazine  50mg  TID, and losartan  100mg  daily.  The patient has been tolerating her medications well. The patient denies any headache, visual changes, dizziness, lightheadness, chest pain, shortness of breath, weakness/numbness, and edema.  Catherine Andrews also has a history of CAD where she had a NSTEMI in 2015.  She had CABG x 2 once in 2015 and again in 2016.  She states she has had 2 heart attacks in the past.  Her last heart cath was done in 11/2013 that showed a 60% LM; 90% LAD, 50% RCA, 80% d RCA.  Patient underwent PCI to LAD in 2015  Pt then underwent CABG with LIMA to LAD.     In Nov 2015 her heat cath showed; LIMA to apical LAD occluded.  Pt underwent PCI and DES x 2 to RCA.  In Nov 2016 :Urgent redo  CABG x 3.  She is currently on lipitor, farxiga, losartan  and metoprolol .  She has no baseline symptoms if angina and denies any chest pain or DOE.  She is on ASA 81mg  daily.  Today, she denies any chest pain, SOB, heart palpitations, periperhal edema, dizziness or other problems.       Past Medical History Past Medical History:  Diagnosis Date   Anemia    Anesthesia complication    Arthritis    Breast cancer (HCC)    Right Breast- 20 yrs ago   CAD (coronary artery disease)    CHF (congestive heart failure) (HCC)    Colon polyp    Constipation    Depression    Esophageal reflux    Fever    Foot drop    High  cholesterol    HTN (hypertension)    Hyperlipemia    Hypertension    Lung cancer (HCC)    Melanoma (HCC)    MI (myocardial infarction) (HCC)    Paroxysmal atrial fibrillation (HCC)    Peripheral arterial disease (HCC)    Skin ulcer of chin (HCC)    SOB (shortness of breath)    Syncope and collapse    Uses hearing aid    Vertigo    Wears partial dentures      Allergies Allergies  Allergen Reactions   Codeine Other (See Comments)    Other reaction(s): HALLUCINATIONS Other reaction(s): HALLUCINATIONS    Latex Itching, Hives and Rash    Per patient break outs  adhesives   Losartan      Other reaction(s): decline gfr   Metformin Hcl     Other reaction(s): diarrhea     Medications  Current Outpatient Medications:    acetaminophen  (TYLENOL ) 325 MG tablet, Take 325 mg by mouth every 6 (six) hours as needed for moderate pain., Disp: , Rfl:    ADMELOG SOLOSTAR 100 UNIT/ML KwikPen, INJECT 18 UNITS SUBQ FOR SUGAR 150, 22 UNITS FOR SUGAR 151-200, 26 UNITS FOR 201-250, 30 UNITS FOR 251-300 THREE TIMES A DAY *EXPIRES 28 DAYS AFTER OPENING*, Disp: 30 mL, Rfl: 10   amLODipine  (NORVASC ) 10 MG tablet, TAKE 1 TABLET BY MOUTH EVERY EVENING FOR BLOOD PRESSURE, Disp: 90 tablet, Rfl: 2   aspirin  81 MG chewable tablet, Chew 81 mg by mouth every evening., Disp: , Rfl:    atorvastatin (LIPITOR) 10 MG tablet, TAKE 1 TABLET BY MOUTH ONCE DAILY FOR CHOLESTEROL, Disp: 90 tablet, Rfl: 2   atorvastatin (LIPITOR) 40 MG tablet, TAKE 1 TABLET BY MOUTH ONCE DAILY FOR CHOLESTEROL, Disp: 90 tablet, Rfl: 2   BD PEN NEEDLE NANO 2ND GEN 32G X 4 MM MISC, , Disp: , Rfl:    buPROPion  (WELLBUTRIN  XL) 150 MG 24 hr tablet, Take 1 tablet (150 mg total) by mouth daily., Disp: 90 tablet, Rfl: 2   Cholecalciferol  125 MCG (5000 UT) TABS, , Disp: , Rfl:    Cyanocobalamin (VITAMIN B 12 PO), Take 1 tablet by mouth daily., Disp: , Rfl:    FARXIGA 10 MG TABS tablet, TAKE 1 TABLET BY MOUTH EVERY MORNING, Disp: 30 tablet, Rfl:  10   fluticasone  (FLONASE ) 50 MCG/ACT nasal spray, Place 1 spray into both nostrils in the morning and at bedtime., Disp: 15.8 mL, Rfl: 0   furosemide  (LASIX ) 40 MG tablet, TAKE 1 TABLET BY MOUTH EVERY OTHER DAY WITH POTASSIUM, Disp: 45 tablet, Rfl: 2  glucose blood (ACCU-CHEK AVIVA PLUS) test strip, 1 strip by Miscellaneous route 3 (three) times a day before meals., Disp: , Rfl:    hydrALAZINE  (APRESOLINE ) 50 MG tablet, TAKE 1 TABLET BY MOUTH THREE TIMES A DAY WITH FOOD, Disp: 270 tablet, Rfl: 2   Insulin  Glargine (BASAGLAR  KWIKPEN) 100 UNIT/ML, Inject 40 Units into the skin daily., Disp: 3 mL, Rfl: 1   losartan  (COZAAR ) 100 MG tablet, Take 1 tablet (100 mg total) by mouth daily., Disp: 90 tablet, Rfl: 4   metoprolol  succinate (TOPROL -XL) 50 MG 24 hr tablet, TAKE 1 & 1/2 TABLET = 75 MG BY MOUTH ONCE DAILY, Disp: 135 tablet, Rfl: 2   Multiple Vitamins-Minerals (PRESERVISION AREDS) CAPS, Take 1 capsule by mouth in the morning and at bedtime., Disp: , Rfl:    omeprazole (PRILOSEC) 20 MG capsule, TAKE 1 CAPSULE BY MOUTH EVERY MORNING 30 MINUTES BEFORE MORNING MEAL *DO NOT CRUSH OR CHEW*, Disp: 90 capsule, Rfl: 2   potassium chloride  (KLOR-CON ) 10 MEQ tablet, TAKE ONE TABLET EVERY OTHER DAY WITH YOUR LASIX , Disp: 90 tablet, Rfl: 3   traZODone  (DESYREL ) 50 MG tablet, TAKE 1/2 TABLET = 25 MG BY MOUTH EVERY NIGHT AT BEDTIME, Disp: 45 tablet, Rfl: 2   venlafaxine  XR (EFFEXOR -XR) 75 MG 24 hr capsule, TAKE 1 CAPSULE BY MOUTH ONCE DAILY *TAKE WITH FOOD* *DO NOT CRUSH OR CHEW*, Disp: 90 capsule, Rfl: 2   Review of Systems Review of Systems  Constitutional:  Negative for chills, fever and malaise/fatigue.  Eyes:  Negative for blurred vision and double vision.  Respiratory:  Negative for cough and shortness of breath.   Cardiovascular:  Negative for chest pain, palpitations and leg swelling.  Gastrointestinal:  Negative for abdominal pain, constipation, diarrhea, nausea and vomiting.  Genitourinary:   Negative for frequency.  Musculoskeletal:  Negative for myalgias.  Skin:  Negative for itching and rash.  Neurological:  Negative for dizziness, weakness and headaches.  Endo/Heme/Allergies:  Negative for polydipsia.       Objective:    Vitals BP 122/60   Pulse (!) 59   Temp 97.6 F (36.4 C)   Resp 18   Ht 5\' 4"  (1.626 m)   Wt 171 lb (77.6 kg)   SpO2 97%   BMI 29.35 kg/m    Physical Examination Physical Exam Constitutional:      Appearance: Normal appearance. She is not ill-appearing.  Cardiovascular:     Rate and Rhythm: Normal rate and regular rhythm.     Pulses: Normal pulses.     Heart sounds: No murmur heard.    No friction rub. No gallop.  Pulmonary:     Effort: Pulmonary effort is normal. No respiratory distress.     Breath sounds: No wheezing, rhonchi or rales.  Abdominal:     General: Bowel sounds are normal. There is no distension.     Palpations: Abdomen is soft.     Tenderness: There is no abdominal tenderness.  Musculoskeletal:     Right lower leg: No edema.     Left lower leg: No edema.  Skin:    General: Skin is warm and dry.     Findings: No rash.  Neurological:     General: No focal deficit present.     Mental Status: She is alert and oriented to person, place, and time.  Psychiatric:        Mood and Affect: Mood normal.        Behavior: Behavior normal.  Assessment & Plan:   Coronary artery disease She has had a MI in the past and CABG.  Currently she denies any angina.  We will conitnue with secondary prevention.  She is on a BB, ARB, statin and ASA.  Essential hypertension Her BP is well controlled.  We will continue her current medications.  Diabetic Nephropathy We will recheck her HgBa1c today.  She had her long acting insulin  changed to basalgar.  We will adjust as needed.  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  She is on an ARB and farxiga.  She goes to see nephrology in 2  weeks.  She is to avoid NSAIDS.    Return in about 3 months (around 04/14/2024).   Wayne Haines, MD

## 2024-01-13 NOTE — Assessment & Plan Note (Signed)
 We will recheck her HgBa1c today.  She had her long acting insulin  changed to basalgar.  We will adjust as needed.

## 2024-01-14 LAB — COMPREHENSIVE METABOLIC PANEL WITH GFR
ALT: 23 IU/L (ref 0–32)
AST: 23 IU/L (ref 0–40)
Albumin: 4.1 g/dL (ref 3.7–4.7)
Alkaline Phosphatase: 98 IU/L (ref 44–121)
BUN/Creatinine Ratio: 23 (ref 12–28)
BUN: 45 mg/dL — ABNORMAL HIGH (ref 8–27)
Bilirubin Total: 0.4 mg/dL (ref 0.0–1.2)
CO2: 24 mmol/L (ref 20–29)
Calcium: 9.1 mg/dL (ref 8.7–10.3)
Chloride: 101 mmol/L (ref 96–106)
Creatinine, Ser: 1.94 mg/dL — ABNORMAL HIGH (ref 0.57–1.00)
Globulin, Total: 3 g/dL (ref 1.5–4.5)
Glucose: 118 mg/dL — ABNORMAL HIGH (ref 70–99)
Potassium: 4.1 mmol/L (ref 3.5–5.2)
Sodium: 143 mmol/L (ref 134–144)
Total Protein: 7.1 g/dL (ref 6.0–8.5)
eGFR: 25 mL/min/{1.73_m2} — ABNORMAL LOW (ref >59)

## 2024-01-14 LAB — LIPID PANEL W/O CHOL/HDL RATIO
Cholesterol, Total: 141 mg/dL (ref 100–199)
HDL: 54 mg/dL (ref >39)
LDL Chol Calc (NIH): 59 mg/dL (ref 0–99)
Triglycerides: 171 mg/dL — ABNORMAL HIGH (ref 0–149)
VLDL Cholesterol Cal: 28 mg/dL (ref 5–40)

## 2024-01-14 LAB — HGB A1C W/O EAG: Hgb A1c MFr Bld: 6.1 % — ABNORMAL HIGH (ref 4.8–5.6)

## 2024-01-25 ENCOUNTER — Other Ambulatory Visit: Payer: Self-pay

## 2024-01-25 ENCOUNTER — Ambulatory Visit: Payer: Self-pay

## 2024-01-25 ENCOUNTER — Other Ambulatory Visit: Payer: Self-pay | Admitting: Internal Medicine

## 2024-01-25 MED ORDER — ATORVASTATIN CALCIUM 10 MG PO TABS: 20.0000 mg | ORAL_TABLET | Freq: Every day | ORAL | 2 refills | Status: DC

## 2024-01-25 NOTE — Progress Notes (Signed)
 Patient called.  Patient aware.  I have called and informed the patient  "Her labs look good. There is no change in her CKD. Her cholesterol is not at goal. Please call in and increase her lipitor to 20mg  at bedtime.".  I have sent in her Lipitor 20 mg at bedtime.

## 2024-01-25 NOTE — Progress Notes (Signed)
 Rx change in dosage.

## 2024-02-16 ENCOUNTER — Encounter: Payer: Self-pay | Admitting: Internal Medicine

## 2024-02-22 ENCOUNTER — Other Ambulatory Visit: Payer: Self-pay

## 2024-02-22 ENCOUNTER — Other Ambulatory Visit: Payer: Self-pay | Admitting: Internal Medicine

## 2024-02-22 MED ORDER — ATORVASTATIN CALCIUM 10 MG PO TABS: 20.0000 mg | ORAL_TABLET | Freq: Every day | ORAL | 2 refills | Status: DC

## 2024-02-22 MED ORDER — ATORVASTATIN CALCIUM 10 MG PO TABS: 20.0000 mg | ORAL_TABLET | Freq: Every day | ORAL | 2 refills | Status: AC

## 2024-03-27 ENCOUNTER — Other Ambulatory Visit: Payer: Self-pay | Admitting: Internal Medicine

## 2024-04-04 ENCOUNTER — Ambulatory Visit: Admitting: Internal Medicine

## 2024-04-04 ENCOUNTER — Other Ambulatory Visit: Payer: Self-pay | Admitting: Internal Medicine

## 2024-04-06 ENCOUNTER — Other Ambulatory Visit: Payer: Self-pay | Admitting: *Deleted

## 2024-04-06 DIAGNOSIS — I739 Peripheral vascular disease, unspecified: Secondary | ICD-10-CM

## 2024-04-06 DIAGNOSIS — I70213 Atherosclerosis of native arteries of extremities with intermittent claudication, bilateral legs: Secondary | ICD-10-CM

## 2024-04-13 ENCOUNTER — Encounter: Payer: Self-pay | Admitting: Internal Medicine

## 2024-04-13 ENCOUNTER — Ambulatory Visit: Admitting: Internal Medicine

## 2024-04-13 VITALS — BP 130/70 | HR 68 | Temp 97.4°F | Resp 18 | Wt 165.2 lb

## 2024-04-13 DIAGNOSIS — E782 Mixed hyperlipidemia: Secondary | ICD-10-CM

## 2024-04-13 DIAGNOSIS — E1121 Type 2 diabetes mellitus with diabetic nephropathy: Secondary | ICD-10-CM | POA: Insufficient documentation

## 2024-04-13 DIAGNOSIS — N1832 Chronic kidney disease, stage 3b: Secondary | ICD-10-CM | POA: Diagnosis not present

## 2024-04-13 DIAGNOSIS — I10 Essential (primary) hypertension: Secondary | ICD-10-CM

## 2024-04-13 NOTE — Progress Notes (Signed)
 Office Visit  Subjective   Patient ID: Catherine Andrews   DOB: May 31, 1935   Age: 89 y.o.   MRN: [id number]   Chief Complaint Chief Complaint  Patient presents with   Follow-up    3 month follow up     History of Present Illness The patient is a 89 year old Caucasian/White female who returns for a follow-up visit for her T2 diabetes. She was diagnosed with diabetes about 40 years ago. This past year, her A1c was a bit elevated but we made no changes as she had her insulin  recently changed at that time.  Her insurance changed her from tresiba to basalgar.  She is currently on basalgar 40 Units daily and ademlog SSI.  She takes around 18-22 Units of short acting sliding scale when she uses it.  She states she takes the sliding scale 3 times per day.  She has been on metformin in the past but this has caused diarrhea.  She is not walking as much as they would like. She specifically denies unexplained abdominal pain, nausea or vomiting and documented hypoglycemia.  She checks blood sugars with a Freestyle libre and she states her blood sugars are ranging 100-160.  Her last HgBa1c was done 3 months ago and was 6.1%.  She came in fasting today in anticipation of lab work. She does not have any long term problems with diabetic neuropathy or retinopathy. She does have a history of diabetic with circulatory problems with PAD and CAD. It also looks like she has some diabetic nephropathy with Stage IIIa CKD.  The patient had a dilated eye exam done on 07/09/2023 done by Dr. McCuen at [facility] which there was no diabetic retinopathy.  She states she has some macular degeneration and she is now receiving eye injections.   She also has a history of Stage IIIb CKD which is probably due to her diabetes and HTN.  Her creatinine has been running 1-1.1 over the last 2 years with a GFR of 32-60.  She is currently on losartan  and farxiga 10mg  daily.  She denies any NSAIDS use.  She seen Bhandari on 10/05/2023  where her kidney function has been stable.  She also had a renal US  done on 10/12/2022 and this showed some cysts and medical renal disease.   The patient is a 89 year old Caucasian/White female who presents for a follow-up evaluation of hypertension.  This past year, her BP was not controlled and we increased her losartan  from 50mg  daily to 100mg  daily.  The patient has been checking her blood pressure at home. The patient's blood pressure has ranged systollically in the 120-130's range. The patient's current medications include: amlodipine  10mg  daily, Lasix  40mg  every other day, hydralazine  50mg  TID, and losartan  100mg  daily.  The patient has been tolerating her medications well. The patient denies any headache, visual changes, dizziness, lightheadness, chest pain, shortness of breath, weakness/numbness, and edema.   She does have a history of HCL.  Again, she also has a history of CAD where she had a NSTEMI in 2015.  She had CABG x 2 once in 2015 and again in 2016.  She states she has had 2 heart attacks in the past.  On her last visit, her cholesterol was not at goal and we increased her lipitor from 10mg  to 20mg  daily.  However, she states she remains on 10mg  daily She denies any abdominal pain, nausea, vomiting, myalgias or fatigue.  She is currently on lipitor 10mg   daily.  She is not fasting for her labs.        Past Medical History Past Medical History:  Diagnosis Date   Anemia    Anesthesia complication    Arthritis    Breast cancer (HCC)    Right Breast- 20 yrs ago   CAD (coronary artery disease)    CHF (congestive heart failure) (HCC)    Colon polyp    Constipation    Depression    Esophageal reflux    Fever    Foot drop    High cholesterol    HTN (hypertension)    Hyperlipemia    Hypertension    Lung cancer (HCC)    Melanoma (HCC)    MI (myocardial infarction) (HCC)    Paroxysmal atrial fibrillation (HCC)    Peripheral arterial disease (HCC)    Skin ulcer of chin (HCC)     SOB (shortness of breath)    Syncope and collapse    Uses hearing aid    Vertigo    Wears partial dentures      Allergies Allergies  Allergen Reactions   Codeine Other (See Comments)    Other reaction(s): HALLUCINATIONS Other reaction(s): HALLUCINATIONS    Latex Itching, Hives and Rash    Per patient break outs  adhesives   Losartan      Other reaction(s): decline gfr   Metformin Hcl     Other reaction(s): diarrhea     Medications  Current Outpatient Medications:    acetaminophen  (TYLENOL ) 325 MG tablet, Take 325 mg by mouth every 6 (six) hours as needed for moderate pain., Disp: , Rfl:    ADMELOG SOLOSTAR 100 UNIT/ML KwikPen, INJECT 18 UNITS SUBQ FOR SUGAR 150, 22 UNITS FOR SUGAR 151-200, 26 UNITS FOR 201-250, 30 UNITS FOR 251-300 THREE TIMES A DAY *EXPIRES 28 DAYS AFTER OPENING*, Disp: 30 mL, Rfl: 10   amLODipine  (NORVASC ) 10 MG tablet, TAKE 1 TABLET BY MOUTH EVERY EVENING FOR BLOOD PRESSURE, Disp: 90 tablet, Rfl: 2   aspirin  81 MG chewable tablet, Chew 81 mg by mouth every evening., Disp: , Rfl:    atorvastatin  (LIPITOR) 10 MG tablet, Take 2 tablets (20 mg total) by mouth daily. for cholesterol., Disp: 90 tablet, Rfl: 2   BD PEN NEEDLE NANO 2ND GEN 32G X 4 MM MISC, , Disp: , Rfl:    buPROPion  (WELLBUTRIN  XL) 150 MG 24 hr tablet, Take 1 tablet (150 mg total) by mouth daily., Disp: 90 tablet, Rfl: 2   Cholecalciferol  125 MCG (5000 UT) TABS, , Disp: , Rfl:    Cyanocobalamin (VITAMIN B 12 PO), Take 1 tablet by mouth daily., Disp: , Rfl:    FARXIGA 10 MG TABS tablet, TAKE 1 TABLET BY MOUTH EVERY MORNING, Disp: 30 tablet, Rfl: 10   fluticasone  (FLONASE ) 50 MCG/ACT nasal spray, Place 1 spray into both nostrils in the morning and at bedtime., Disp: 15.8 mL, Rfl: 0   furosemide  (LASIX ) 40 MG tablet, TAKE 1 TABLET BY MOUTH EVERY OTHER DAY WITH POTASSIUM, Disp: 45 tablet, Rfl: 2   glucose blood (ACCU-CHEK AVIVA PLUS) test strip, 1 strip by Miscellaneous route 3 (three) times a day  before meals., Disp: , Rfl:    hydrALAZINE  (APRESOLINE ) 50 MG tablet, TAKE 1 TABLET BY MOUTH THREE TIMES A DAY WITH FOOD, Disp: 270 tablet, Rfl: 2   Insulin  Glargine (BASAGLAR  KWIKPEN) 100 UNIT/ML, Inject 40 Units into the skin daily., Disp: 3 mL, Rfl: 1   losartan  (COZAAR ) 100 MG tablet, Take 1 tablet (  100 mg total) by mouth daily., Disp: 90 tablet, Rfl: 4   metoprolol  succinate (TOPROL -XL) 50 MG 24 hr tablet, TAKE 1 & 1/2 TABLET = 75 MG BY MOUTH ONCE DAILY, Disp: 135 tablet, Rfl: 10   Multiple Vitamins-Minerals (PRESERVISION AREDS) CAPS, Take 1 capsule by mouth in the morning and at bedtime., Disp: , Rfl:    omeprazole (PRILOSEC) 20 MG capsule, TAKE 1 CAPSULE BY MOUTH EVERY MORNING, 30 MINUTES BEFORE MORNING MEAL *DO NOT CRUSH OR CHEW*, Disp: 90 capsule, Rfl: 10   potassium chloride  (KLOR-CON  M) 10 MEQ tablet, TAKE 1 TABLET BY MOUTH EVERY OTHER DAY WITH LASIX  *TAKE WITH FOOD* *DO NOT CRUSH OR CHEW* *MAY DISSOLVE*, Disp: 45 tablet, Rfl: 3   potassium chloride  (KLOR-CON ) 10 MEQ tablet, TAKE ONE TABLET EVERY OTHER DAY WITH YOUR LASIX , Disp: 90 tablet, Rfl: 3   traZODone  (DESYREL ) 50 MG tablet, TAKE 1/2 TABLET= 25 MG BY MOUTH EVERY NIGHT AT BEDTIME, Disp: 45 tablet, Rfl: 10   venlafaxine  XR (EFFEXOR -XR) 75 MG 24 hr capsule, TAKE 1 CAPSULE BY MOUTH ONCE DAILY *TAKE WITH FOOD* *DO NOT CRUSH OR CHEW*, Disp: 90 capsule, Rfl: 2   Review of Systems Review of Systems  Constitutional:  Negative for chills, fever, malaise/fatigue and weight loss.  Eyes:  Negative for blurred vision and double vision.  Respiratory:  Negative for cough and shortness of breath.   Cardiovascular:  Negative for chest pain, palpitations and leg swelling.  Gastrointestinal:  Negative for abdominal pain, constipation, diarrhea, nausea and vomiting.  Genitourinary:  Negative for frequency.  Musculoskeletal:  Negative for myalgias.  Skin:  Negative for itching and rash.  Neurological:  Negative for dizziness, weakness and  headaches.  Endo/Heme/Allergies:  Negative for polydipsia.       Objective:    Vitals BP 130/70   Pulse 68   Temp (!) 97.4 F (36.3 C)   Resp 18   Wt 165 lb 4 oz (75 kg)   SpO2 97%   BMI 28.37 kg/m    Physical Examination Physical Exam Constitutional:      Appearance: Normal appearance. She is not ill-appearing.  Cardiovascular:     Rate and Rhythm: Normal rate and regular rhythm.     Pulses: Normal pulses.     Heart sounds: No murmur heard.    No friction rub. No gallop.  Pulmonary:     Effort: Pulmonary effort is normal. No respiratory distress.     Breath sounds: No wheezing, rhonchi or rales.  Abdominal:     General: Bowel sounds are normal. There is no distension.     Palpations: Abdomen is soft.     Tenderness: There is no abdominal tenderness.  Musculoskeletal:     Right lower leg: No edema.     Left lower leg: No edema.  Skin:    General: Skin is warm and dry.     Findings: No rash.  Neurological:     General: No focal deficit present.     Mental Status: She is alert and oriented to person, place, and time.  Psychiatric:        Mood and Affect: Mood normal.        Behavior: Behavior normal.        Assessment & Plan:   Essential hypertension Her BP is controlled and she is not having problems with her medications.  We will see her back in 3 months for a yearly exam.  Diabetic nephropathy associated with type 2 diabetes mellitus (HCC) Her  HgBa1c has been controlled this past year.  We will continue on her insulins and check her A1c and other and her other labs on her next visit.  Stage 3b chronic kidney disease (HCC) She did recently see nephrology but I do not have their notes.  She will remain on her ARB and farxiga.  We will continue to control her diabetes and hypertension.  She is avoiding NSAIDS.  Hyperlipidemia She did not go up on the dose of lipitor. Her goal LDL <55 with her history of diabetes and CAD/MI.  We will recheck her FLP on her  next visit.    Return in about 3 months (around 07/14/2024) for annual.   Selinda Fleeta Finger, MD

## 2024-04-13 NOTE — Assessment & Plan Note (Signed)
 She did recently see nephrology but I do not have their notes.  She will remain on her ARB and farxiga.  We will continue to control her diabetes and hypertension.  She is avoiding NSAIDS.

## 2024-04-13 NOTE — Assessment & Plan Note (Signed)
 She did not go up on the dose of lipitor. Her goal LDL <55 with her history of diabetes and CAD/MI.  We will recheck her FLP on her next visit.

## 2024-04-13 NOTE — Assessment & Plan Note (Signed)
 Her BP is controlled and she is not having problems with her medications.  We will see her back in 3 months for a yearly exam.

## 2024-04-13 NOTE — Assessment & Plan Note (Signed)
 Her HgBa1c has been controlled this past year.  We will continue on her insulins and check her A1c and other and her other labs on her next visit.

## 2024-05-02 ENCOUNTER — Ambulatory Visit (HOSPITAL_COMMUNITY)
Admission: RE | Admit: 2024-05-02 | Discharge: 2024-05-02 | Disposition: A | Discharge status: Home / Self Care | Source: Ambulatory Visit | Attending: Vascular Surgery | Admitting: Vascular Surgery

## 2024-05-02 ENCOUNTER — Ambulatory Visit: Admitting: Vascular Surgery

## 2024-05-02 ENCOUNTER — Ambulatory Visit (HOSPITAL_BASED_OUTPATIENT_CLINIC_OR_DEPARTMENT_OTHER)
Admission: RE | Admit: 2024-05-02 | Discharge: 2024-05-02 | Disposition: A | Discharge status: Home / Self Care | Source: Ambulatory Visit | Attending: Vascular Surgery | Admitting: Vascular Surgery

## 2024-05-02 ENCOUNTER — Encounter: Payer: Self-pay | Admitting: Vascular Surgery

## 2024-05-02 VITALS — BP 170/60 | HR 61 | Temp 97.9°F | Resp 20 | Ht 64.0 in | Wt 165.0 lb

## 2024-05-02 DIAGNOSIS — I70213 Atherosclerosis of native arteries of extremities with intermittent claudication, bilateral legs: Secondary | ICD-10-CM | POA: Insufficient documentation

## 2024-05-02 DIAGNOSIS — I70203 Unspecified atherosclerosis of native arteries of extremities, bilateral legs: Secondary | ICD-10-CM | POA: Diagnosis not present

## 2024-05-02 DIAGNOSIS — I739 Peripheral vascular disease, unspecified: Secondary | ICD-10-CM

## 2024-05-02 LAB — VAS US ABI WITH/WO TBI
Left ABI: 1.26
Right ABI: 1.05

## 2024-05-02 NOTE — Progress Notes (Signed)
 Patient name: Catherine Andrews MRN: [id number] DOB: 03-Aug-1935 Sex: female  REASON FOR CONSULT: 1 year follow-up, PAD  HPI: Catherine Andrews is a 89 y.o. female, with history of breast cancer, lung cancer, melanoma, diabetes, hypertension, hyperlipidemia, coronary artery disease status post CABG, atrial fibrillation, peripheral vascular disease that presents for one year follow-up for surveillance of her PAD.    She previously was under the care of a vascular surgeon in [place] but unfortunately her son died and she is unable to get transportation.  She has an extensive vascular surgery history including remote bilateral iliac stents and SFA stents.  She then later had a right fem to above-knee popliteal bypass with GSV in 2004.  She then had a left common femoral to above-knee popliteal bypass in 2013.  Ultimately this graft had to be removed due to infection and then she had a redo left femoral to BK pop bypass in 2017.  She had a chronic foot drop on the left.    She spends most of her time in a wheelchair, but is able to stand and pivot.  No new concerns today.  No new leg pain.  States not really able to walk and again in a wheelchair most of the time.  Past Medical History:  Diagnosis Date   Anemia    Anesthesia complication    Arthritis    Breast cancer (HCC)    Right Breast- 20 yrs ago   CAD (coronary artery disease)    CHF (congestive heart failure) (HCC)    Colon polyp    Constipation    Depression    Esophageal reflux    Fever    Foot drop    High cholesterol    HTN (hypertension)    Hyperlipemia    Hypertension    Lung cancer (HCC)    Melanoma (HCC)    MI (myocardial infarction) (HCC)    Paroxysmal atrial fibrillation (HCC)    Peripheral arterial disease (HCC)    Skin ulcer of chin (HCC)    SOB (shortness of breath)    Syncope and collapse    Uses hearing aid    Vertigo    Wears partial dentures     Past Surgical History:  Procedure Laterality Date   ABDOMINAL  HYSTERECTOMY     BREAST BIOPSY     BREAST CYST EXCISION     BREAST LUMPECTOMY Right    20 yrs ago   cardiac stents     CARDIAC SURGERY     CATARACT EXTRACTION      Family History  Problem Relation Age of Onset   Kidney disease Mother    Diabetes Mother    Heart failure Father    Diabetes Father    Hyperlipidemia Sister    Diabetes Sister    Diabetes Brother    Lymphoma Brother    Diabetes Maternal Grandmother    Diabetes Paternal Grandfather    Hypertension Other    Diabetes Son     SOCIAL HISTORY: Social History   Socioeconomic History   Marital status: Widowed    Spouse name: Not on file   Number of children: Not on file   Years of education: Not on file   Highest education level: Not on file  Occupational History   Not on file  Tobacco Use   Smoking status: Former   Smokeless tobacco: Never  Vaping Use   Vaping status: Never Used  Substance and Sexual Activity   Alcohol use:  Yes    Alcohol/week: 2.0 standard drinks of alcohol    Types: 2 Glasses of wine per week    Comment: sometimes   Drug use: Never   Sexual activity: Not on file  Other Topics Concern   Not on file  Social History Narrative   Not on file   Social Drivers of Health   Financial Resource Strain: Not on file  Food Insecurity: Not on file  Transportation Needs: Not on file  Physical Activity: Not on file  Stress: Not on file  Social Connections: Not on file  Intimate Partner Violence: Not on file    Allergies  Allergen Reactions   Codeine Other (See Comments)    Other reaction(s): HALLUCINATIONS Other reaction(s): HALLUCINATIONS    Latex Itching, Hives and Rash    Per patient break outs  adhesives   Losartan      Other reaction(s): decline gfr   Metformin Hcl     Other reaction(s): diarrhea    Current Outpatient Medications  Medication Sig Dispense Refill   acetaminophen  (TYLENOL ) 325 MG tablet Take 325 mg by mouth every 6 (six) hours as needed for moderate pain.      ADMELOG SOLOSTAR 100 UNIT/ML KwikPen INJECT 18 UNITS SUBQ FOR SUGAR 150, 22 UNITS FOR SUGAR 151-200, 26 UNITS FOR 201-250, 30 UNITS FOR 251-300 THREE TIMES A DAY *EXPIRES 28 DAYS AFTER OPENING* 30 mL 10   amLODipine  (NORVASC ) 10 MG tablet TAKE 1 TABLET BY MOUTH EVERY EVENING FOR BLOOD PRESSURE 90 tablet 2   aspirin  81 MG chewable tablet Chew 81 mg by mouth every evening.     atorvastatin  (LIPITOR) 10 MG tablet Take 2 tablets (20 mg total) by mouth daily. for cholesterol. 90 tablet 2   BD PEN NEEDLE NANO 2ND GEN 32G X 4 MM MISC      buPROPion  (WELLBUTRIN  XL) 150 MG 24 hr tablet Take 1 tablet (150 mg total) by mouth daily. 90 tablet 2   Cholecalciferol  125 MCG (5000 UT) TABS      Cyanocobalamin (VITAMIN B 12 PO) Take 1 tablet by mouth daily.     FARXIGA 10 MG TABS tablet TAKE 1 TABLET BY MOUTH EVERY MORNING 30 tablet 10   fluticasone  (FLONASE ) 50 MCG/ACT nasal spray Place 1 spray into both nostrils in the morning and at bedtime. 15.8 mL 0   furosemide  (LASIX ) 40 MG tablet TAKE 1 TABLET BY MOUTH EVERY OTHER DAY WITH POTASSIUM 45 tablet 2   glucose blood (ACCU-CHEK AVIVA PLUS) test strip 1 strip by Miscellaneous route 3 (three) times a day before meals.     hydrALAZINE  (APRESOLINE ) 50 MG tablet TAKE 1 TABLET BY MOUTH THREE TIMES A DAY WITH FOOD 270 tablet 2   Insulin  Glargine (BASAGLAR  KWIKPEN) 100 UNIT/ML Inject 40 Units into the skin daily. 3 mL 1   losartan  (COZAAR ) 100 MG tablet Take 1 tablet (100 mg total) by mouth daily. 90 tablet 4   metoprolol  succinate (TOPROL -XL) 50 MG 24 hr tablet TAKE 1 & 1/2 TABLET = 75 MG BY MOUTH ONCE DAILY 135 tablet 10   Multiple Vitamins-Minerals (PRESERVISION AREDS) CAPS Take 1 capsule by mouth in the morning and at bedtime.     omeprazole (PRILOSEC) 20 MG capsule TAKE 1 CAPSULE BY MOUTH EVERY MORNING, 30 MINUTES BEFORE MORNING MEAL *DO NOT CRUSH OR CHEW* 90 capsule 10   potassium chloride  (KLOR-CON  M) 10 MEQ tablet TAKE 1 TABLET BY MOUTH EVERY OTHER DAY WITH LASIX   *TAKE WITH FOOD* *DO NOT CRUSH  OR CHEW* *MAY DISSOLVE* 45 tablet 3   potassium chloride  (KLOR-CON ) 10 MEQ tablet TAKE ONE TABLET EVERY OTHER DAY WITH YOUR LASIX  90 tablet 3   traZODone  (DESYREL ) 50 MG tablet TAKE 1/2 TABLET= 25 MG BY MOUTH EVERY NIGHT AT BEDTIME 45 tablet 10   venlafaxine  XR (EFFEXOR -XR) 75 MG 24 hr capsule TAKE 1 CAPSULE BY MOUTH ONCE DAILY *TAKE WITH FOOD* *DO NOT CRUSH OR CHEW* 90 capsule 2   No current facility-administered medications for this visit.    REVIEW OF SYSTEMS:  [X]  denotes positive finding, [ ]  denotes negative finding Cardiac  Comments:  Chest pain or chest pressure:    Shortness of breath upon exertion:    Short of breath when lying flat:    Irregular heart rhythm:        Vascular    Pain in calf, thigh, or hip brought on by ambulation:    Pain in feet at night that wakes you up from your sleep:     Blood clot in your veins:    Leg swelling:         Pulmonary    Oxygen at home:    Productive cough:     Wheezing:         Neurologic    Sudden weakness in arms or legs:     Sudden numbness in arms or legs:     Sudden onset of difficulty speaking or slurred speech:    Temporary loss of vision in one eye:     Problems with dizziness:         Gastrointestinal    Blood in stool:     Vomited blood:         Genitourinary    Burning when urinating:     Blood in urine:        Psychiatric    Major depression:         Hematologic    Bleeding problems:    Problems with blood clotting too easily:        Skin    Rashes or ulcers:        Constitutional    Fever or chills:      PHYSICAL EXAM: There were no vitals filed for this visit.   GENERAL: The patient is a well-nourished female, in no acute distress. The vital signs are documented above. CARDIAC: There is a regular rate and rhythm.  VASCULAR:  Bilateral femoral pulses palpable Bilateral PT pulses palpable No lower extremity tissue loss Chronic left foot drop PULMONARY: No  respiratory distress. ABDOMEN: Soft and non-tender. MUSCULOSKELETAL: There are no major deformities or cyanosis. SKIN: There are no ulcers or rashes noted. PSYCHIATRIC: The patient has a normal affect.  DATA:   Lower extremity arterial duplex today shows right leg bypass with 50 to 70% stenosis at the proximal anastomosis with a velocity of 208 and a widely patent left leg bypass (of note the inflow velocity of the right leg bypass was 185 approxiatmely 1 year ago)  ABI's today are 1.05 on the right mulitphasic and 1.26 on the left triphasic  Assessment/Plan:  89 y.o. female, with history of breast cancer, lung cancer, melanoma, diabetes, hypertension, hyperlipidemia, coronary artery disease status post CABG, atrial fibrillation, peripheral vascular disease that presents for one year follow-up for surveillance of PAD.  She previously was under the care of a vascular surgeon in [place] but unfortunately her son died and she has been unable to get transportation.    She has  an extensive vascular surgery history including remote bilateral iliac stents and SFA stents.  She then later had a right femoral to above-knee popliteal bypass in 2004.  She then had a left common femoral to above-knee popliteal bypass in 2013.  Ultimately this graft had to be removed after it got infected and then she had a redo left femoral to BK pop bypass in 2017.  She has a chronic left foot drop.   Discussed that her duplex today shows both bypasses remain patent without any critical flow-limiting stenosis.  The inflow velocity in the right leg bypass is really not significantly changed over the last year.  I will continue to follow with interval surveillance.  I recommended follow-up in 1 year.  She has normal ABIs and palpable pedal pulses.  On aspirin  statin for risk reduction.   Lonni DOROTHA Gaskins, MD [facility] Office: [phone]

## 2024-05-19 ENCOUNTER — Other Ambulatory Visit: Payer: Self-pay

## 2024-05-19 ENCOUNTER — Emergency Department (HOSPITAL_COMMUNITY)

## 2024-05-19 ENCOUNTER — Encounter (HOSPITAL_COMMUNITY): Payer: Self-pay

## 2024-05-19 ENCOUNTER — Inpatient Hospital Stay (HOSPITAL_COMMUNITY)
Admission: EM | Admit: 2024-05-19 | Discharge: 2024-05-25 | DRG: 177 | Disposition: A | Discharge status: Skilled Nursing Facility | Source: Skilled Nursing Facility | Attending: Internal Medicine | Admitting: Internal Medicine

## 2024-05-19 DIAGNOSIS — E1121 Type 2 diabetes mellitus with diabetic nephropathy: Secondary | ICD-10-CM | POA: Diagnosis present

## 2024-05-19 DIAGNOSIS — Z87891 Personal history of nicotine dependence: Secondary | ICD-10-CM | POA: Diagnosis not present

## 2024-05-19 DIAGNOSIS — I2510 Atherosclerotic heart disease of native coronary artery without angina pectoris: Secondary | ICD-10-CM | POA: Diagnosis present

## 2024-05-19 DIAGNOSIS — Z955 Presence of coronary angioplasty implant and graft: Secondary | ICD-10-CM | POA: Diagnosis not present

## 2024-05-19 DIAGNOSIS — R001 Bradycardia, unspecified: Secondary | ICD-10-CM | POA: Diagnosis not present

## 2024-05-19 DIAGNOSIS — E7800 Pure hypercholesterolemia, unspecified: Secondary | ICD-10-CM | POA: Diagnosis present

## 2024-05-19 DIAGNOSIS — Z853 Personal history of malignant neoplasm of breast: Secondary | ICD-10-CM

## 2024-05-19 DIAGNOSIS — E785 Hyperlipidemia, unspecified: Secondary | ICD-10-CM | POA: Diagnosis not present

## 2024-05-19 DIAGNOSIS — N1832 Chronic kidney disease, stage 3b: Secondary | ICD-10-CM | POA: Diagnosis present

## 2024-05-19 DIAGNOSIS — K219 Gastro-esophageal reflux disease without esophagitis: Secondary | ICD-10-CM | POA: Diagnosis present

## 2024-05-19 DIAGNOSIS — C799 Secondary malignant neoplasm of unspecified site: Secondary | ICD-10-CM | POA: Diagnosis present

## 2024-05-19 DIAGNOSIS — Z7982 Long term (current) use of aspirin: Secondary | ICD-10-CM

## 2024-05-19 DIAGNOSIS — E1151 Type 2 diabetes mellitus with diabetic peripheral angiopathy without gangrene: Secondary | ICD-10-CM | POA: Diagnosis present

## 2024-05-19 DIAGNOSIS — Z8249 Family history of ischemic heart disease and other diseases of the circulatory system: Secondary | ICD-10-CM

## 2024-05-19 DIAGNOSIS — J9601 Acute respiratory failure with hypoxia: Secondary | ICD-10-CM | POA: Diagnosis present

## 2024-05-19 DIAGNOSIS — E1122 Type 2 diabetes mellitus with diabetic chronic kidney disease: Secondary | ICD-10-CM | POA: Diagnosis present

## 2024-05-19 DIAGNOSIS — H9190 Unspecified hearing loss, unspecified ear: Secondary | ICD-10-CM | POA: Diagnosis present

## 2024-05-19 DIAGNOSIS — F32A Depression, unspecified: Secondary | ICD-10-CM | POA: Diagnosis present

## 2024-05-19 DIAGNOSIS — J189 Pneumonia, unspecified organism: Secondary | ICD-10-CM | POA: Diagnosis not present

## 2024-05-19 DIAGNOSIS — U071 COVID-19: Principal | ICD-10-CM | POA: Diagnosis present

## 2024-05-19 DIAGNOSIS — Z794 Long term (current) use of insulin: Secondary | ICD-10-CM | POA: Diagnosis not present

## 2024-05-19 DIAGNOSIS — J1282 Pneumonia due to coronavirus disease 2019: Secondary | ICD-10-CM | POA: Diagnosis present

## 2024-05-19 DIAGNOSIS — Z66 Do not resuscitate: Secondary | ICD-10-CM | POA: Diagnosis present

## 2024-05-19 DIAGNOSIS — D849 Immunodeficiency, unspecified: Secondary | ICD-10-CM | POA: Diagnosis present

## 2024-05-19 DIAGNOSIS — Z85118 Personal history of other malignant neoplasm of bronchus and lung: Secondary | ICD-10-CM

## 2024-05-19 DIAGNOSIS — Z85820 Personal history of malignant melanoma of skin: Secondary | ICD-10-CM | POA: Diagnosis not present

## 2024-05-19 DIAGNOSIS — R0902 Hypoxemia: Secondary | ICD-10-CM | POA: Diagnosis present

## 2024-05-19 DIAGNOSIS — Z83438 Family history of other disorder of lipoprotein metabolism and other lipidemia: Secondary | ICD-10-CM

## 2024-05-19 DIAGNOSIS — Z841 Family history of disorders of kidney and ureter: Secondary | ICD-10-CM

## 2024-05-19 DIAGNOSIS — Z833 Family history of diabetes mellitus: Secondary | ICD-10-CM

## 2024-05-19 DIAGNOSIS — I130 Hypertensive heart and chronic kidney disease with heart failure and stage 1 through stage 4 chronic kidney disease, or unspecified chronic kidney disease: Secondary | ICD-10-CM | POA: Diagnosis present

## 2024-05-19 DIAGNOSIS — Z860100 Personal history of colon polyps, unspecified: Secondary | ICD-10-CM

## 2024-05-19 DIAGNOSIS — I252 Old myocardial infarction: Secondary | ICD-10-CM | POA: Diagnosis not present

## 2024-05-19 DIAGNOSIS — C3490 Malignant neoplasm of unspecified part of unspecified bronchus or lung: Secondary | ICD-10-CM | POA: Diagnosis present

## 2024-05-19 DIAGNOSIS — Z90710 Acquired absence of both cervix and uterus: Secondary | ICD-10-CM

## 2024-05-19 DIAGNOSIS — I4891 Unspecified atrial fibrillation: Secondary | ICD-10-CM | POA: Diagnosis not present

## 2024-05-19 DIAGNOSIS — R54 Age-related physical debility: Secondary | ICD-10-CM | POA: Diagnosis present

## 2024-05-19 DIAGNOSIS — I480 Paroxysmal atrial fibrillation: Secondary | ICD-10-CM | POA: Diagnosis present

## 2024-05-19 DIAGNOSIS — Z974 Presence of external hearing-aid: Secondary | ICD-10-CM

## 2024-05-19 DIAGNOSIS — Z79899 Other long term (current) drug therapy: Secondary | ICD-10-CM

## 2024-05-19 DIAGNOSIS — I10 Essential (primary) hypertension: Secondary | ICD-10-CM | POA: Diagnosis present

## 2024-05-19 DIAGNOSIS — Z807 Family history of other malignant neoplasms of lymphoid, hematopoietic and related tissues: Secondary | ICD-10-CM

## 2024-05-19 LAB — COMPREHENSIVE METABOLIC PANEL WITH GFR
ALT: 15 U/L (ref 0–44)
AST: 23 U/L (ref 15–41)
Albumin: 3.8 g/dL (ref 3.5–5.0)
Alkaline Phosphatase: 90 U/L (ref 38–126)
Anion gap: 17 — ABNORMAL HIGH (ref 5–15)
BUN: 36 mg/dL — ABNORMAL HIGH (ref 8–23)
CO2: 22 mmol/L (ref 22–32)
Calcium: 9 mg/dL (ref 8.9–10.3)
Chloride: 92 mmol/L — ABNORMAL LOW (ref 98–111)
Creatinine, Ser: 1.69 mg/dL — ABNORMAL HIGH (ref 0.44–1.00)
GFR, Estimated: 29 mL/min — ABNORMAL LOW (ref >60)
Glucose, Bld: 363 mg/dL — ABNORMAL HIGH (ref 70–99)
Potassium: 4.9 mmol/L (ref 3.5–5.1)
Sodium: 131 mmol/L — ABNORMAL LOW (ref 135–145)
Total Bilirubin: 0.9 mg/dL (ref 0.0–1.2)
Total Protein: 7 g/dL (ref 6.5–8.1)

## 2024-05-19 LAB — CBC
HCT: 38.8 % (ref 36.0–46.0)
Hemoglobin: 12.2 g/dL (ref 12.0–15.0)
MCH: 33.3 pg (ref 26.0–34.0)
MCHC: 31.4 g/dL (ref 30.0–36.0)
MCV: 106 fL — ABNORMAL HIGH (ref 80.0–100.0)
Platelets: 151 K/uL (ref 150–400)
RBC: 3.66 MIL/uL — ABNORMAL LOW (ref 3.87–5.11)
RDW: 14.7 % (ref 11.5–15.5)
WBC: 15.9 K/uL — ABNORMAL HIGH (ref 4.0–10.5)
nRBC: 0 % (ref 0.0–0.2)

## 2024-05-19 LAB — RESP PANEL BY RT-PCR (RSV, FLU A&B, COVID)  RVPGX2
Influenza A by PCR: NEGATIVE
Influenza B by PCR: NEGATIVE
Resp Syncytial Virus by PCR: NEGATIVE
SARS Coronavirus 2 by RT PCR: POSITIVE — AB

## 2024-05-19 LAB — CBG MONITORING, ED: Glucose-Capillary: 248 mg/dL — ABNORMAL HIGH (ref 70–99)

## 2024-05-19 LAB — GLUCOSE, CAPILLARY: Glucose-Capillary: 269 mg/dL — ABNORMAL HIGH (ref 70–99)

## 2024-05-19 LAB — LIPASE, BLOOD: Lipase: 11 U/L (ref 11–51)

## 2024-05-19 LAB — PRO BRAIN NATRIURETIC PEPTIDE: Pro Brain Natriuretic Peptide: 1045 pg/mL — ABNORMAL HIGH (ref <300.0)

## 2024-05-19 MED ORDER — GUAIFENESIN-DM 100-10 MG/5ML PO SYRP
5.0000 mL | ORAL_SOLUTION | ORAL | Status: DC | PRN
  Administered 2024-05-19 – 2024-05-25 (×6): 5 mL via ORAL
  Filled 2024-05-19 (×6): qty 10

## 2024-05-19 MED ORDER — ONDANSETRON HCL 4 MG/2ML IJ SOLN: 4.0000 mg | Freq: Four times a day (QID) | INTRAMUSCULAR | Status: DC | PRN

## 2024-05-19 MED ORDER — SODIUM CHLORIDE 0.9 % IV SOLN
100.0000 mg | Freq: Every day | INTRAVENOUS | Status: AC
  Administered 2024-05-20 – 2024-05-21 (×2): 100 mg via INTRAVENOUS
  Filled 2024-05-19 (×2): qty 20

## 2024-05-19 MED ORDER — DOXYCYCLINE HYCLATE 100 MG PO TABS
100.0000 mg | ORAL_TABLET | Freq: Once | ORAL | Status: AC
  Administered 2024-05-19: 100 mg via ORAL
  Filled 2024-05-19: qty 1

## 2024-05-19 MED ORDER — SODIUM CHLORIDE 0.9 % IV SOLN
200.0000 mg | Freq: Once | INTRAVENOUS | Status: AC
  Administered 2024-05-19: 200 mg via INTRAVENOUS
  Filled 2024-05-19: qty 40

## 2024-05-19 MED ORDER — BASAGLAR KWIKPEN 100 UNIT/ML ~~LOC~~ SOPN: 30.0000 [IU] | PEN_INJECTOR | Freq: Every day | SUBCUTANEOUS | Status: DC

## 2024-05-19 MED ORDER — ALBUTEROL SULFATE (2.5 MG/3ML) 0.083% IN NEBU: 2.5000 mg | INHALATION_SOLUTION | RESPIRATORY_TRACT | Status: DC | PRN

## 2024-05-19 MED ORDER — INSULIN ASPART 100 UNIT/ML IJ SOLN
0.0000 [IU] | Freq: Three times a day (TID) | INTRAMUSCULAR | Status: DC
  Administered 2024-05-19: 5 [IU] via SUBCUTANEOUS
  Administered 2024-05-20 (×2): 2 [IU] via SUBCUTANEOUS
  Administered 2024-05-20 – 2024-05-21 (×3): 3 [IU] via SUBCUTANEOUS
  Administered 2024-05-21: 5 [IU] via SUBCUTANEOUS
  Administered 2024-05-22: 8 [IU] via SUBCUTANEOUS
  Administered 2024-05-22: 5 [IU] via SUBCUTANEOUS
  Administered 2024-05-22 – 2024-05-23 (×3): 3 [IU] via SUBCUTANEOUS
  Administered 2024-05-23: 5 [IU] via SUBCUTANEOUS
  Administered 2024-05-24: 3 [IU] via SUBCUTANEOUS
  Administered 2024-05-25: 2 [IU] via SUBCUTANEOUS
  Administered 2024-05-25: 5 [IU] via SUBCUTANEOUS
  Administered 2024-05-25: 2 [IU] via SUBCUTANEOUS
  Filled 2024-05-19: qty 0.15

## 2024-05-19 MED ORDER — INSULIN GLARGINE 100 UNIT/ML ~~LOC~~ SOLN
30.0000 [IU] | Freq: Every day | SUBCUTANEOUS | Status: DC
  Administered 2024-05-19 – 2024-05-20 (×2): 30 [IU] via SUBCUTANEOUS
  Filled 2024-05-19 (×4): qty 0.3

## 2024-05-19 MED ORDER — ONDANSETRON HCL 4 MG PO TABS: 4.0000 mg | ORAL_TABLET | Freq: Four times a day (QID) | ORAL | Status: DC | PRN

## 2024-05-19 MED ORDER — TRAZODONE HCL 50 MG PO TABS
25.0000 mg | ORAL_TABLET | Freq: Every evening | ORAL | Status: DC | PRN
  Administered 2024-05-19 – 2024-05-24 (×6): 25 mg via ORAL
  Filled 2024-05-19 (×6): qty 1

## 2024-05-19 MED ORDER — DOXYCYCLINE HYCLATE 100 MG PO TABS
100.0000 mg | ORAL_TABLET | Freq: Two times a day (BID) | ORAL | Status: AC
  Administered 2024-05-19 – 2024-05-23 (×9): 100 mg via ORAL
  Filled 2024-05-19 (×9): qty 1

## 2024-05-19 MED ORDER — SODIUM CHLORIDE 0.9 % IV BOLUS
1000.0000 mL | Freq: Once | INTRAVENOUS | Status: AC
  Administered 2024-05-19: 1000 mL via INTRAVENOUS

## 2024-05-19 MED ORDER — ACETAMINOPHEN 650 MG RE SUPP: 650.0000 mg | Freq: Four times a day (QID) | RECTAL | Status: DC | PRN

## 2024-05-19 MED ORDER — INSULIN GLARGINE 100 UNIT/ML ~~LOC~~ SOLN
40.0000 [IU] | Freq: Every day | SUBCUTANEOUS | Status: DC
  Filled 2024-05-19: qty 0.4

## 2024-05-19 MED ORDER — DEXAMETHASONE 4 MG PO TABS
6.0000 mg | ORAL_TABLET | ORAL | Status: DC
  Administered 2024-05-19 – 2024-05-21 (×3): 6 mg via ORAL
  Filled 2024-05-19 (×3): qty 1

## 2024-05-19 MED ORDER — HEPARIN SODIUM (PORCINE) 5000 UNIT/ML IJ SOLN
5000.0000 [IU] | Freq: Three times a day (TID) | INTRAMUSCULAR | Status: DC
  Administered 2024-05-19 – 2024-05-25 (×19): 5000 [IU] via SUBCUTANEOUS
  Filled 2024-05-19 (×19): qty 1

## 2024-05-19 MED ORDER — CEFTRIAXONE SODIUM 1 G IJ SOLR: 1.0000 g | INTRAMUSCULAR | Status: DC

## 2024-05-19 MED ORDER — CEFTRIAXONE SODIUM 1 G IJ SOLR
1.0000 g | Freq: Once | INTRAMUSCULAR | Status: AC
  Administered 2024-05-19: 1 g via INTRAVENOUS
  Filled 2024-05-19: qty 10

## 2024-05-19 MED ORDER — ACETAMINOPHEN 325 MG PO TABS
650.0000 mg | ORAL_TABLET | Freq: Four times a day (QID) | ORAL | Status: DC | PRN
  Administered 2024-05-22: 650 mg via ORAL
  Filled 2024-05-19: qty 2

## 2024-05-19 MED ORDER — INSULIN ASPART 100 UNIT/ML IJ SOLN
0.0000 [IU] | Freq: Every day | INTRAMUSCULAR | Status: DC
  Administered 2024-05-19: 3 [IU] via SUBCUTANEOUS
  Administered 2024-05-20: 2 [IU] via SUBCUTANEOUS
  Administered 2024-05-21: 5 [IU] via SUBCUTANEOUS
  Administered 2024-05-22: 3 [IU] via SUBCUTANEOUS
  Administered 2024-05-23: 4 [IU] via SUBCUTANEOUS
  Administered 2024-05-24: 3 [IU] via SUBCUTANEOUS
  Filled 2024-05-19: qty 0.05

## 2024-05-19 MED ORDER — PANTOPRAZOLE SODIUM 40 MG PO TBEC
40.0000 mg | DELAYED_RELEASE_TABLET | Freq: Every day | ORAL | Status: DC
  Administered 2024-05-19 – 2024-05-25 (×7): 40 mg via ORAL
  Filled 2024-05-19 (×7): qty 1

## 2024-05-19 NOTE — ED Triage Notes (Addendum)
 Patient BIB [facility] from [facility]. Patient began feeling bad Tuesday. Has shortness of breath, body aches, chills since Tuesday. Tested at home for covid today and was positive. EMS tried to administer tylenol  but patient vomited it up. Feels nauseous still. 88% room air, EMS placed patient on 2L [place]. Patient usually does not wear oxygen.

## 2024-05-19 NOTE — ED Provider Notes (Signed)
 [place] EMERGENCY DEPARTMENT AT [facility] Provider Note   CSN: [id number] Arrival date & time: 05/19/24  1025     Patient presents with: Shortness of Breath   Catherine Andrews is a 89 y.o. female with history of bladder tumor, non-small cell lung cancer, presenting to the ED with shortness of breath and fatigue.  Patient ports symptoms onset 5 days ago.  She reports he tested positive for COVID yesterday.  She says she has poor appetite, vomiting, headaches, fatigue, shortness of breath.   HPI     Prior to Admission medications   Medication Sig Start Date End Date Taking? Authorizing Provider  acetaminophen  (TYLENOL ) 325 MG tablet Take 325 mg by mouth every 6 (six) hours as needed for moderate pain. 11/15/17   [provider]  ADMELOG SOLOSTAR 100 UNIT/ML KwikPen INJECT 18 UNITS SUBQ FOR SUGAR 150, 22 UNITS FOR SUGAR 151-200, 26 UNITS FOR 201-250, 30 UNITS FOR 251-300 THREE TIMES A DAY *EXPIRES 28 DAYS AFTER OPENING* 05/25/23   Fleeta Finger, Selinda, MD  amLODipine  (NORVASC ) 10 MG tablet TAKE 1 TABLET BY MOUTH EVERY EVENING FOR BLOOD PRESSURE 11/30/23   Fleeta Finger Selinda, MD  aspirin  81 MG chewable tablet Chew 81 mg by mouth every evening.    [provider]  atorvastatin  (LIPITOR) 10 MG tablet Take 2 tablets (20 mg total) by mouth daily. for cholesterol. 02/22/24   Fleeta Finger Selinda, MD  BD PEN NEEDLE NANO 2ND GEN 32G X 4 MM MISC  09/18/21   [provider]  buPROPion  (WELLBUTRIN  XL) 150 MG 24 hr tablet Take 1 tablet (150 mg total) by mouth daily. 10/28/23   Amin, Saad, MD  Cholecalciferol  125 MCG (5000 UT) TABS  11/14/20   [provider]  Cyanocobalamin (VITAMIN B 12 PO) Take 1 tablet by mouth daily.    [provider]  FARXIGA 10 MG TABS tablet TAKE 1 TABLET BY MOUTH EVERY MORNING 11/30/23   Fleeta Finger, Selinda, MD  fluticasone  (FLONASE ) 50 MCG/ACT nasal spray Place 1 spray into both nostrils in the morning and at bedtime. 10/14/23 10/13/24  Fleeta Finger Selinda, MD  furosemide  (LASIX ) 40 MG tablet TAKE 1 TABLET BY MOUTH EVERY OTHER DAY WITH POTASSIUM 12/01/23   Okey Vina GAILS, MD  glucose blood (ACCU-CHEK AVIVA PLUS) test strip 1 strip by Miscellaneous route 3 (three) times a day before meals. 03/02/18   [provider]  hydrALAZINE  (APRESOLINE ) 50 MG tablet TAKE 1 TABLET BY MOUTH THREE TIMES A DAY WITH FOOD 09/20/23   Caleen Dirks, MD  Insulin  Glargine (BASAGLAR  KWIKPEN) 100 UNIT/ML Inject 40 Units into the skin daily. 11/03/23   Fleeta Finger Selinda, MD  losartan  (COZAAR ) 100 MG tablet Take 1 tablet (100 mg total) by mouth daily. 06/01/23   Fleeta Finger Selinda, MD  metoprolol  succinate (TOPROL -XL) 50 MG 24 hr tablet TAKE 1 & 1/2 TABLET = 75 MG BY MOUTH ONCE DAILY 04/04/24   Fleeta Finger, Selinda, MD  Multiple Vitamins-Minerals (PRESERVISION AREDS) CAPS Take 1 capsule by mouth in the morning and at bedtime.    [provider]  omeprazole (PRILOSEC) 20 MG capsule TAKE 1 CAPSULE BY MOUTH EVERY MORNING, 30 MINUTES BEFORE MORNING MEAL *DO NOT CRUSH OR CHEW* 04/04/24   Fleeta Finger Selinda, MD  potassium chloride  (KLOR-CON  M) 10 MEQ tablet TAKE 1 TABLET BY MOUTH EVERY OTHER DAY WITH LASIX  *TAKE WITH FOOD* *DO NOT CRUSH OR CHEW* *MAY DISSOLVE* 01/25/24   Okey Vina GAILS, MD  potassium chloride  (KLOR-CON ) 10 MEQ tablet TAKE ONE TABLET EVERY OTHER DAY WITH YOUR LASIX  10/23/21   Okey Vina GAILS, MD  traZODone  (DESYREL ) 50 MG tablet TAKE 1/2 TABLET= 25 MG BY MOUTH EVERY NIGHT AT BEDTIME 03/27/24   Fleeta Finger, Selinda, MD  venlafaxine  XR (EFFEXOR -XR) 75 MG 24 hr capsule TAKE 1 CAPSULE BY MOUTH ONCE DAILY *TAKE WITH FOOD* *DO NOT CRUSH OR CHEW* 11/30/23   Fleeta Finger Selinda, MD    Allergies: Codeine, Latex, Losartan , and Metformin hcl    Review of Systems  Updated Vital Signs BP (!) 166/47   Pulse 73   Temp (!) 100.4 F (38 C) (Oral)   Resp 15   Ht 5' 4 (1.626 m)   Wt 74.8 kg   SpO2 100%   BMI 28.32 kg/m   Physical Exam Constitutional:      General: She is not in acute  distress. HENT:     Head: Normocephalic and atraumatic.  Eyes:     Conjunctiva/sclera: Conjunctivae normal.     Pupils: Pupils are equal, round, and reactive to light.  Cardiovascular:     Rate and Rhythm: Normal rate and regular rhythm.  Pulmonary:     Effort: Pulmonary effort is normal. No respiratory distress.  Abdominal:     General: There is no distension.     Tenderness: There is no abdominal tenderness.  Skin:    General: Skin is warm and dry.  Neurological:     General: No focal deficit present.     Mental Status: She is alert. Mental status is at baseline.  Psychiatric:        Mood and Affect: Mood normal.        Behavior: Behavior normal.     (all labs ordered are listed, but only abnormal results are displayed) Labs Reviewed  RESP PANEL BY RT-PCR (RSV, FLU A&B, COVID)  RVPGX2 - Abnormal; Notable for the following components:      Result Value   SARS Coronavirus 2 by RT PCR POSITIVE (*)    All other components within normal limits  CBC - Abnormal; Notable for the following components:   WBC 15.9 (*)    RBC 3.66 (*)    MCV 106.0 (*)    All other components within normal limits  PRO BRAIN NATRIURETIC PEPTIDE - Abnormal; Notable for the following components:   Pro Brain Natriuretic Peptide 1,045.0 (*)    All other components within normal limits  COMPREHENSIVE METABOLIC PANEL WITH GFR - Abnormal; Notable for the following components:   Sodium 131 (*)    Chloride 92 (*)    Glucose, Bld 363 (*)    BUN 36 (*)    Creatinine, Ser 1.69 (*)    GFR, Estimated 29 (*)    Anion gap 17 (*)    All other components within normal limits  LIPASE, BLOOD    EKG: EKG Interpretation Date/Time:  Friday May 19 2024 11:15:56 EDT Ventricular Rate:  74 PR Interval:  197 QRS Duration:  116 QT Interval:  406 QTC Calculation: 451 R Axis:   -2  Text Interpretation: Sinus rhythm Probable left ventricular hypertrophy Anterior Q waves, possibly due to LVH Confirmed by Cottie Cough [id number]) on 05/19/2024 11:52:37 AM  Radiology: [facility] Chest Portable 1 View Result Date: 05/19/2024 CLINICAL DATA:  Shortness of breath. EXAM: PORTABLE CHEST 1 VIEW COMPARISON:  November 07, 2020. FINDINGS: Stable cardiomediastinal silhouette. Left lung is clear. Increased right basilar opacity is noted concerning for pneumonia or atelectasis.  Small right pleural effusion may be present. Bony thorax is unremarkable. Sternotomy wires are noted. IMPRESSION: Increased right basilar opacity is noted concerning for pneumonia or atelectasis. Small right pleural effusion may be present. Followup PA and lateral chest X-ray is recommended in 3-4 weeks following trial of antibiotic therapy to ensure resolution and exclude underlying malignancy. Electronically Signed   By: Lynwood Landy Raddle M.D.   On: 05/19/2024 12:08     Procedures   Medications Ordered in the ED  cefTRIAXone  (ROCEPHIN ) 1 g in sodium chloride  0.9 % 100 mL IVPB (has no administration in time range)  doxycycline  (VIBRA -TABS) tablet 100 mg (has no administration in time range)  remdesivir  200 mg in sodium chloride  0.9% 250 mL IVPB (has no administration in time range)    Followed by  remdesivir  100 mg in sodium chloride  0.9 % 100 mL IVPB (has no administration in time range)  dexamethasone  (DECADRON ) tablet 6 mg (has no administration in time range)  sodium chloride  0.9 % bolus 1,000 mL (1,000 mLs Intravenous New Bag/Given 05/19/24 1132)    Clinical Course as of 05/19/24 1256  Fri May 19, 2024  1151 Given patient's recent COVID diagnosis at home I did not feel that she needed a sepsis workup on arrival.  This seems more likely a viral etiology than bacterial infection.  Febrile here but otherwise not meeting SIRS criteria [MT]  1255 Admitted to hospitalist [MT]    Clinical Course User Index [MT] Lowanda Cashaw, Donnice PARAS, MD                                 Medical Decision Making Amount and/or Complexity of Data Reviewed Labs:  ordered. Radiology: ordered. ECG/medicine tests: ordered.  Risk Prescription drug management. Decision regarding hospitalization.   This patient presents to the ED with concern for general fatigue, shortness of breath. This involves an extensive number of treatment options, and is a complaint that carries with it a high risk of complications and morbidity.  The differential diagnosis includes viral syndrome most likely given her recent COVID diagnosis at home.  Differential would also include bacterial infection, sepsis, pneumonia, other medical comorbidities  No audible wheezing or respiratory distress on arrival.  Patient satting 88% on arrival and is stable on 2 L nasal cannula  Co-morbidities that complicate the patient evaluation: Immunocompromised for cancer treatment  Additional history obtained from EMS  External records from outside source obtained and reviewed including oncology office record  I ordered and personally interpreted labs.  The pertinent results include: Leukocytosis, elevated BNP, COVID-positive  I ordered imaging studies including x-ray of the chest I independently visualized and interpreted imaging which showed some vascular congestion potential right lower lobe infiltrate I agree with the radiologist interpretation  The patient was maintained on a cardiac monitor.  I personally viewed and interpreted the cardiac monitored which showed an underlying rhythm of: Sinus rhythm  Per my interpretation the patient's ECG shows no acute ischemia  I ordered medication including fluids and antibiotics for potential superimposed bacterial infection  I have reviewed the patients home medicines and have made adjustments as needed  Test Considered: Lower suspicion for acute PE  TYRHONDA GEORGIADES was evaluated in Emergency Department on 05/19/2024 for the symptoms described in the history of present illness. She was evaluated in the context of the global COVID-19 pandemic,  which necessitated consideration that the patient might be at risk for infection with the  SARS-CoV-2 virus that causes COVID-19. Institutional protocols and algorithms that pertain to the evaluation of patients at risk for COVID-19 are in a state of rapid change based on information released by regulatory bodies including the CDC and federal and state organizations. These policies and algorithms were followed during the patient's care in the ED.   After the interventions noted above, I reevaluated the patient and found that they have: improved  Disposition:  After consideration of the diagnostic results and the patients response to treatment, I feel that the patent would benefit from medical admission      Final diagnoses:  COVID-19  Hypoxia    ED Discharge Orders     None          Cottie Donnice PARAS, MD 05/19/24 1256

## 2024-05-19 NOTE — H&P (Signed)
 History and Physical  Catherine Andrews FMW:[id number] DOB: 1934/11/01 DOA: 05/19/2024  PCP: Fleeta Valeria Mayo, MD   Chief Complaint: Shortness of breath and fatigue  HPI: Catherine Andrews is a 89 y.o. female with medical history significant for insulin -dependent type 2 diabetes, hypertension, melanoma, metastatic lung cancer on chemotherapy, paroxysmal atrial fibrillation on aspirin  being admitted to the hospital with acute hypoxic respiratory failure due to COVID-pneumonia.  Patient states that she started having pulmonary symptoms about 3 days ago, a couple days before that she was exposed to somebody that had cold symptoms.  Initially the patient thought that she just had a cold, but she had persistent cough, dyspnea with exertion, and feeling of fatigue.  Yesterday, she tested herself for COVID and was positive.  She is also having some nausea and vomiting today, noted to be saturating 88% on room air is now comfortable on 2 L nasal cannula.  Review of Systems: Please see HPI for pertinent positives and negatives. A complete 10 system review of systems are otherwise negative.  Past Medical History:  Diagnosis Date   Anemia    Anesthesia complication    Arthritis    Breast cancer (HCC)    Right Breast- 20 yrs ago   CAD (coronary artery disease)    CHF (congestive heart failure) (HCC)    Colon polyp    Constipation    Depression    Esophageal reflux    Fever    Foot drop    High cholesterol    HTN (hypertension)    Hyperlipemia    Hypertension    Lung cancer (HCC)    Melanoma (HCC)    MI (myocardial infarction) (HCC)    Paroxysmal atrial fibrillation (HCC)    Peripheral arterial disease (HCC)    Skin ulcer of chin (HCC)    SOB (shortness of breath)    Syncope and collapse    Uses hearing aid    Vertigo    Wears partial dentures    Past Surgical History:  Procedure Laterality Date   ABDOMINAL HYSTERECTOMY     BREAST BIOPSY     BREAST CYST EXCISION     BREAST LUMPECTOMY Right     20 yrs ago   cardiac stents     CARDIAC SURGERY     CATARACT EXTRACTION     Social History:  reports that she has quit smoking. She has never used smokeless tobacco. She reports current alcohol use of about 2.0 standard drinks of alcohol per week. She reports that she does not use drugs.  Allergies  Allergen Reactions   Codeine Other (See Comments)    Other reaction(s): HALLUCINATIONS Other reaction(s): HALLUCINATIONS    Latex Itching, Hives and Rash    Per patient break outs  adhesives   Losartan      Other reaction(s): decline gfr   Metformin Hcl     Other reaction(s): diarrhea    Family History  Problem Relation Age of Onset   Kidney disease Mother    Diabetes Mother    Heart failure Father    Diabetes Father    Hyperlipidemia Sister    Diabetes Sister    Diabetes Brother    Lymphoma Brother    Diabetes Maternal Grandmother    Diabetes Paternal Grandfather    Hypertension Other    Diabetes Son      Prior to Admission medications   Medication Sig Start Date End Date Taking? Authorizing Provider  acetaminophen  (TYLENOL ) 325 MG tablet Take 325 mg  by mouth every 6 (six) hours as needed for moderate pain. 11/15/17   [provider]  ADMELOG SOLOSTAR 100 UNIT/ML KwikPen INJECT 18 UNITS SUBQ FOR SUGAR 150, 22 UNITS FOR SUGAR 151-200, 26 UNITS FOR 201-250, 30 UNITS FOR 251-300 THREE TIMES A DAY *EXPIRES 28 DAYS AFTER OPENING* 05/25/23   Fleeta Finger, Selinda, MD  amLODipine  (NORVASC ) 10 MG tablet TAKE 1 TABLET BY MOUTH EVERY EVENING FOR BLOOD PRESSURE 11/30/23   Fleeta Finger Selinda, MD  aspirin  81 MG chewable tablet Chew 81 mg by mouth every evening.    [provider]  atorvastatin  (LIPITOR) 10 MG tablet Take 2 tablets (20 mg total) by mouth daily. for cholesterol. 02/22/24   Fleeta Finger Selinda, MD  BD PEN NEEDLE NANO 2ND GEN 32G X 4 MM MISC  09/18/21   [provider]  buPROPion  (WELLBUTRIN  XL) 150 MG 24 hr tablet Take 1 tablet (150 mg total) by mouth daily.  10/28/23   Amin, Saad, MD  Cholecalciferol  125 MCG (5000 UT) TABS  11/14/20   [provider]  Cyanocobalamin (VITAMIN B 12 PO) Take 1 tablet by mouth daily.    [provider]  FARXIGA 10 MG TABS tablet TAKE 1 TABLET BY MOUTH EVERY MORNING 11/30/23   Fleeta Finger, Selinda, MD  fluticasone  (FLONASE ) 50 MCG/ACT nasal spray Place 1 spray into both nostrils in the morning and at bedtime. 10/14/23 10/13/24  Fleeta Finger Selinda, MD  furosemide  (LASIX ) 40 MG tablet TAKE 1 TABLET BY MOUTH EVERY OTHER DAY WITH POTASSIUM 12/01/23   Okey Vina GAILS, MD  glucose blood (ACCU-CHEK AVIVA PLUS) test strip 1 strip by Miscellaneous route 3 (three) times a day before meals. 03/02/18   [provider]  hydrALAZINE  (APRESOLINE ) 50 MG tablet TAKE 1 TABLET BY MOUTH THREE TIMES A DAY WITH FOOD 09/20/23   Caleen Dirks, MD  Insulin  Glargine (BASAGLAR  KWIKPEN) 100 UNIT/ML Inject 40 Units into the skin daily. 11/03/23   Fleeta Finger Selinda, MD  losartan  (COZAAR ) 100 MG tablet Take 1 tablet (100 mg total) by mouth daily. 06/01/23   Fleeta Finger Selinda, MD  metoprolol  succinate (TOPROL -XL) 50 MG 24 hr tablet TAKE 1 & 1/2 TABLET = 75 MG BY MOUTH ONCE DAILY 04/04/24   Fleeta Finger, Selinda, MD  Multiple Vitamins-Minerals (PRESERVISION AREDS) CAPS Take 1 capsule by mouth in the morning and at bedtime.    [provider]  omeprazole (PRILOSEC) 20 MG capsule TAKE 1 CAPSULE BY MOUTH EVERY MORNING, 30 MINUTES BEFORE MORNING MEAL *DO NOT CRUSH OR CHEW* 04/04/24   Fleeta Finger, Selinda, MD  potassium chloride  (KLOR-CON  M) 10 MEQ tablet TAKE 1 TABLET BY MOUTH EVERY OTHER DAY WITH LASIX  *TAKE WITH FOOD* *DO NOT CRUSH OR CHEW* *MAY DISSOLVE* 01/25/24   Okey Vina GAILS, MD  potassium chloride  (KLOR-CON ) 10 MEQ tablet TAKE ONE TABLET EVERY OTHER DAY WITH YOUR LASIX  10/23/21   Okey Vina GAILS, MD  traZODone  (DESYREL ) 50 MG tablet TAKE 1/2 TABLET= 25 MG BY MOUTH EVERY NIGHT AT BEDTIME 03/27/24   Fleeta Finger Selinda, MD  venlafaxine  XR (EFFEXOR -XR) 75 MG 24 hr capsule TAKE 1  CAPSULE BY MOUTH ONCE DAILY *TAKE WITH FOOD* *DO NOT CRUSH OR CHEW* 11/30/23   Fleeta Finger Selinda, MD    Physical Exam: BP (!) 166/47   Pulse 73   Temp (!) 100.4 F (38 C) (Oral)   Resp 15   Ht 5' 4 (1.626 m)   Wt 74.8 kg   SpO2 100%  BMI 28.32 kg/m  General:  Alert, orientedx4, calm, in no acute distress, no family at the bedside.  Resting comfortably on 2 L nasal cannula oxygen.  Hard of hearing, but quite sharp and a good historian. Cardiovascular: RRR, no murmurs or rubs, no peripheral edema  Respiratory: Breath sounds are diminished globally, no wheezes, no crackles, no tachypnea or other evidence of respiratory distress Abdomen: soft, nontender, nondistended, normal bowel tones heard  Skin: dry, no rashes  Musculoskeletal: no joint effusions, normal range of motion  Psychiatric: appropriate affect, normal speech  Neurologic: extraocular muscles intact, clear speech, moving all extremities with intact sensorium         Labs on Admission:  Basic Metabolic Panel: Recent Labs  Lab 05/19/24 1125  NA 131*  K 4.9  CL 92*  CO2 22  GLUCOSE 363*  BUN 36*  CREATININE 1.69*  CALCIUM  9.0   Liver Function Tests: Recent Labs  Lab 05/19/24 1125  AST 23  ALT 15  ALKPHOS 90  BILITOT 0.9  PROT 7.0  ALBUMIN 3.8   Recent Labs  Lab 05/19/24 1125  LIPASE 11   No results for input(s): AMMONIA in the last 168 hours. CBC: Recent Labs  Lab 05/19/24 1126  WBC 15.9*  HGB 12.2  HCT 38.8  MCV 106.0*  PLT 151   Cardiac Enzymes: No results for input(s): CKTOTAL, CKMB, CKMBINDEX, TROPONINI in the last 168 hours. BNP (last 3 results) No results for input(s): BNP in the last 8760 hours.  ProBNP (last 3 results) Recent Labs    05/19/24 1125  PROBNP 1,045.0*    CBG: No results for input(s): GLUCAP in the last 168 hours.  Radiological Exams on Admission: DG Chest Portable 1 View Result Date: 05/19/2024 CLINICAL DATA:  Shortness of breath. EXAM: PORTABLE  CHEST 1 VIEW COMPARISON:  November 07, 2020. FINDINGS: Stable cardiomediastinal silhouette. Left lung is clear. Increased right basilar opacity is noted concerning for pneumonia or atelectasis. Small right pleural effusion may be present. Bony thorax is unremarkable. Sternotomy wires are noted. IMPRESSION: Increased right basilar opacity is noted concerning for pneumonia or atelectasis. Small right pleural effusion may be present. Followup PA and lateral chest X-ray is recommended in 3-4 weeks following trial of antibiotic therapy to ensure resolution and exclude underlying malignancy. Electronically Signed   By: Lynwood Landy Raddle M.D.   On: 05/19/2024 12:08   Assessment/Plan Catherine Andrews is a 89 y.o. female with medical history significant for insulin -dependent type 2 diabetes, hypertension, melanoma, metastatic lung cancer on chemotherapy, paroxysmal atrial fibrillation on aspirin  being admitted to the hospital with acute hypoxic respiratory failure due to COVID-pneumonia.  COVID-pneumonia, acute hypoxic respiratory failure-was exposed to somebody with cold-like symptoms over the weekend, started having cough, dyspnea and progressive fatigue over the last 3 or 4 days. -Inpatient admission -Supplemental oxygen with goal O2 saturation greater than 89% -Given fever, leukocytosis and consolidation start empiric antibiotics doxycycline /Rocephin  -Meets criteria for antiviral COVID therapy, given her hypoxia and symptoms less than 5 days, will start remdesivir  and p.o. Decadron , discussed with patient who agrees  Paroxysmal atrial fibrillation-monitor vitals, continue home aspirin  and Toprol -XL  Type 2 diabetes-insulin -dependent, well-controlled with A1c of 6.1 on 01/13/2024 -Carb modified diet -Continue home basal insulin  at reduced dose of 30 units Basaglar  nightly -Moderate dose sliding scale  CKD stage III-renal function appears to be at baseline, will monitor daily while in the  hospital  Hyperlipidemia-Lipitor  Hypertension-Norvasc   Depression-Wellbutrin   GERD-Protonix  p.o. daily  DVT prophylaxis: Subcutaneous heparin   Code Status: Limited: Do not attempt resuscitation (DNR) -DNR-LIMITED -Do Not Intubate/DNI , discussed and confirmed with the patient at the time of admission.  Consults called: None  Admission status: The appropriate patient status for this patient is INPATIENT. Inpatient status is judged to be reasonable and necessary in order to provide the required intensity of service to ensure the patient's safety. The patient's presenting symptoms, physical exam findings, and initial radiographic and laboratory data in the context of their chronic comorbidities is felt to place them at high risk for further clinical deterioration. Furthermore, it is not anticipated that the patient will be medically stable for discharge from the hospital within 2 midnights of admission.    I certify that at the point of admission it is my clinical judgment that the patient will require inpatient hospital care spanning beyond 2 midnights from the point of admission due to high intensity of service, high risk for further deterioration and high frequency of surveillance required  Time spent: 59 minutes  Terald Jump CHRISTELLA Gail MD [facility] Pager [phone]  If 7PM-7AM, please contact night-coverage [email or web address] Password [email or web address]  05/19/2024, 1:14 PM

## 2024-05-20 DIAGNOSIS — N1832 Chronic kidney disease, stage 3b: Secondary | ICD-10-CM

## 2024-05-20 DIAGNOSIS — E1121 Type 2 diabetes mellitus with diabetic nephropathy: Secondary | ICD-10-CM

## 2024-05-20 DIAGNOSIS — E785 Hyperlipidemia, unspecified: Secondary | ICD-10-CM | POA: Diagnosis not present

## 2024-05-20 DIAGNOSIS — K219 Gastro-esophageal reflux disease without esophagitis: Secondary | ICD-10-CM

## 2024-05-20 DIAGNOSIS — I4891 Unspecified atrial fibrillation: Secondary | ICD-10-CM | POA: Diagnosis not present

## 2024-05-20 DIAGNOSIS — U071 COVID-19: Secondary | ICD-10-CM | POA: Diagnosis not present

## 2024-05-20 DIAGNOSIS — F32A Depression, unspecified: Secondary | ICD-10-CM

## 2024-05-20 DIAGNOSIS — I10 Essential (primary) hypertension: Secondary | ICD-10-CM

## 2024-05-20 LAB — GLUCOSE, CAPILLARY
Glucose-Capillary: 145 mg/dL — ABNORMAL HIGH (ref 70–99)
Glucose-Capillary: 152 mg/dL — ABNORMAL HIGH (ref 70–99)
Glucose-Capillary: 121 mg/dL — ABNORMAL HIGH (ref 70–99)
Glucose-Capillary: 218 mg/dL — ABNORMAL HIGH (ref 70–99)

## 2024-05-20 LAB — CBC
HCT: 37.6 % (ref 36.0–46.0)
Hemoglobin: 11.5 g/dL — ABNORMAL LOW (ref 12.0–15.0)
MCH: 32.6 pg (ref 26.0–34.0)
MCHC: 30.6 g/dL (ref 30.0–36.0)
MCV: 106.5 fL — ABNORMAL HIGH (ref 80.0–100.0)
Platelets: 133 K/uL — ABNORMAL LOW (ref 150–400)
RBC: 3.53 MIL/uL — ABNORMAL LOW (ref 3.87–5.11)
RDW: 14.8 % (ref 11.5–15.5)
WBC: 13.8 K/uL — ABNORMAL HIGH (ref 4.0–10.5)
nRBC: 0 % (ref 0.0–0.2)

## 2024-05-20 LAB — BASIC METABOLIC PANEL WITH GFR
Anion gap: 14 (ref 5–15)
BUN: 42 mg/dL — ABNORMAL HIGH (ref 8–23)
CO2: 23 mmol/L (ref 22–32)
Calcium: 9.2 mg/dL (ref 8.9–10.3)
Chloride: 102 mmol/L (ref 98–111)
Creatinine, Ser: 1.56 mg/dL — ABNORMAL HIGH (ref 0.44–1.00)
GFR, Estimated: 32 mL/min — ABNORMAL LOW (ref >60)
Glucose, Bld: 166 mg/dL — ABNORMAL HIGH (ref 70–99)
Potassium: 4.2 mmol/L (ref 3.5–5.1)
Sodium: 140 mmol/L (ref 135–145)

## 2024-05-20 MED ORDER — SODIUM CHLORIDE 0.9 % IV SOLN
2.0000 g | INTRAVENOUS | Status: AC
  Administered 2024-05-20 – 2024-05-23 (×4): 2 g via INTRAVENOUS
  Filled 2024-05-20 (×4): qty 20

## 2024-05-20 MED ORDER — IPRATROPIUM-ALBUTEROL 0.5-2.5 (3) MG/3ML IN SOLN
3.0000 mL | Freq: Three times a day (TID) | RESPIRATORY_TRACT | Status: DC
  Administered 2024-05-20: 3 mL via RESPIRATORY_TRACT
  Filled 2024-05-20: qty 3

## 2024-05-20 NOTE — Progress Notes (Signed)
 PROGRESS NOTE    Catherine Andrews  FMW:[id number] DOB: 1935/01/02 DOA: 05/19/2024 PCP: Fleeta Valeria Mayo, MD    Chief Complaint  Patient presents with   Shortness of Breath    Brief Narrative:  Patient is a 89 year old female history of insulin -dependent type 2 diabetes, hypertension, melanoma, metastatic lung cancer on chemotherapy, paroxysmal A-fib on aspirin  admitted to the hospital with acute hypoxic respiratory failure felt secondary to her COVID pneumonia and CAP.   Assessment & Plan:   Principal Problem:   Pneumonia due to COVID-19 virus Active Problems:   Depression   Gastroesophageal reflux disease   Hyperlipidemia   Stage 3b chronic kidney disease (HCC)   Essential hypertension   Atrial fibrillation (HCC)   Diabetic nephropathy associated with type 2 diabetes mellitus (HCC)  #1 acute hypoxic respiratory failure secondary to COVID-pneumonia/CAP - Patient noted to have been exposed to someone at church with cold-like symptoms/upper respiratory symptoms over the weekend and noted to have a cough, dyspnea on exertion and progressive fatigue for the past 3 to 4 days prior to admission. - SARS coronavirus 2 PCR positive. - Influenza A and B PCR negative. - Chest x-ray done with increased right basilar opacity concerning for pneumonia or atelectasis. - Patient noted on presentation to have had fevers, leukocytosis, dyspnea, cough, and as such patient placed on empiric antibiotics of doxycycline  and Rocephin  to also cover CAP. - Patient also noted to have met criteria for antiviral COVID therapy due to hypoxia, symptoms occurring <5 days and assessed patient placed on remdesivir  and oral Decadron . -Place on scheduled DuoNebs.  Continue PPI - Supportive care.  2.  Paroxysmal A-fib -Continue Toprol -XL for rate control. - Continue home regimen aspirin .  3.  Well-controlled insulin -dependent type 2 diabetes -Hemoglobin A1c of 6.1 on 01/13/2024. - CBG noted at 145 this morning. -  Patient on steroids. - Patient placed back on a decreased home regimen of basal insulin  with Lantus  of 30 units nightly, SSI.  4.  CKD stage IIIb - Stable.  5.  Hyperlipidemia -Continue statin.  6.  Hypertension -Continue home regimen Norvasc .  7.  Depression -Continue home regimen Wellbutrin .  8.  GERD -PPI.   DVT prophylaxis: Heparin  Code Status: DNR Family Communication: Updated patient.  Updated sisters and daughter-in-law at bedside. Disposition: TBD  Status is: Inpatient Remains inpatient appropriate because: Severity of illness   Consultants:  None  Procedures:  Chest x-ray 05/19/2024   Antimicrobials:  Anti-infectives (From admission, onward)    Start     Dose/Rate Route Frequency Ordered Stop   05/20/24 1300  cefTRIAXone  (ROCEPHIN ) 1 g in sodium chloride  0.9 % 100 mL IVPB  Status:  Discontinued        1 g 200 mL/hr over 30 Minutes Intravenous Every 24 hours 05/19/24 1257 05/20/24 0920   05/20/24 1300  cefTRIAXone  (ROCEPHIN ) 2 g in sodium chloride  0.9 % 100 mL IVPB        2 g 200 mL/hr over 30 Minutes Intravenous Every 24 hours 05/20/24 0920     05/20/24 1000  remdesivir  100 mg in sodium chloride  0.9 % 100 mL IVPB       Placed in Followed by Linked Group   100 mg 200 mL/hr over 30 Minutes Intravenous Daily 05/19/24 1256 05/22/24 0959   05/19/24 2200  doxycycline  (VIBRA -TABS) tablet 100 mg        100 mg Oral Every 12 hours 05/19/24 1257     05/19/24 1330  remdesivir  200 mg in sodium chloride   0.9% 250 mL IVPB       Placed in Followed by Linked Group   200 mg 580 mL/hr over 30 Minutes Intravenous Once 05/19/24 1256 05/19/24 1543   05/19/24 1230  cefTRIAXone  (ROCEPHIN ) 1 g in sodium chloride  0.9 % 100 mL IVPB        1 g 200 mL/hr over 30 Minutes Intravenous  Once 05/19/24 1222 05/19/24 1508   05/19/24 1230  doxycycline  (VIBRA -TABS) tablet 100 mg        100 mg Oral  Once 05/19/24 1222 05/19/24 1414         Subjective: Patient sleeping but  arousable.  Denies any chest pain.  Feels shortness of breath is improving since admission.  No abdominal pain.  Overall feeling better than she did on presentation.  Sisters and daughter-in-law at bedside.  Objective: Vitals:   05/19/24 2225 05/20/24 0149 05/20/24 0611 05/20/24 1334  BP: (!) 155/50 (!) 146/48 (!) 170/60 (!) 147/48  Pulse: 76 72 81 84  Resp: 16 16 16 16   Temp: 98.4 F (36.9 C) 98.4 F (36.9 C) 98.7 F (37.1 C) 98.5 F (36.9 C)  TempSrc: Oral Oral Oral Oral  SpO2: 99% 98% 99% 97%  Weight:      Height:       No intake or output data in the 24 hours ending 05/20/24 1645 Filed Weights   05/19/24 1032  Weight: 74.8 kg    Examination:  General exam: Appears calm and comfortable.  Dry mucous membranes. Respiratory system: Some scattered coarse breath sounds.  No significant wheezing.  No crackles.  Fair air movement.  Speaking in full sentences. Cardiovascular system: S1 & S2 heard, RRR. No JVD, murmurs, rubs, gallops or clicks. No pedal edema. Gastrointestinal system: Abdomen is nondistended, soft and nontender. No organomegaly or masses felt. Normal bowel sounds heard. Central nervous system: Alert and oriented. No focal neurological deficits. Extremities: Symmetric 5 x 5 power. Skin: No rashes, lesions or ulcers Psychiatry: Judgement and insight appear normal. Mood & affect appropriate.     Data Reviewed: I have personally reviewed following labs and imaging studies  CBC: Recent Labs  Lab 05/19/24 1126 05/20/24 0426  WBC 15.9* 13.8*  HGB 12.2 11.5*  HCT 38.8 37.6  MCV 106.0* 106.5*  PLT 151 133*    Basic Metabolic Panel: Recent Labs  Lab 05/19/24 1125 05/20/24 0426  NA 131* 140  K 4.9 4.2  CL 92* 102  CO2 22 23  GLUCOSE 363* 166*  BUN 36* 42*  CREATININE 1.69* 1.56*  CALCIUM  9.0 9.2    GFR: Estimated Creatinine Clearance: 25.1 mL/min (A) (by C-G formula based on SCr of 1.56 mg/dL (H)).  Liver Function Tests: Recent Labs  Lab  05/19/24 1125  AST 23  ALT 15  ALKPHOS 90  BILITOT 0.9  PROT 7.0  ALBUMIN 3.8    CBG: Recent Labs  Lab 05/19/24 1834 05/19/24 2238 05/20/24 0732 05/20/24 1150  GLUCAP 248* 269* 145* 152*     Recent Results (from the past 240 hours)  Resp panel by RT-PCR (RSV, Flu A&B, Covid) Anterior Nasal Swab     Status: Abnormal   Collection Time: 05/19/24 11:25 AM   Specimen: Anterior Nasal Swab  Result Value Ref Range Status   SARS Coronavirus 2 by RT PCR POSITIVE (A) NEGATIVE Final    Comment: (NOTE) SARS-CoV-2 target nucleic acids are DETECTED.  The SARS-CoV-2 RNA is generally detectable in upper respiratory specimens during the acute phase of infection. Positive results are  indicative of the presence of the identified virus, but do not rule out bacterial infection or co-infection with other pathogens not detected by the test. Clinical correlation with patient history and other diagnostic information is necessary to determine patient infection status. The expected result is Negative.  Fact Sheet for Patients: BloggerCourse.com  Fact Sheet for Healthcare Providers: SeriousBroker.it  This test is not yet approved or cleared by the [place]  FDA and  has been authorized for detection and/or diagnosis of SARS-CoV-2 by FDA under an Emergency Use Authorization (EUA).  This EUA will remain in effect (meaning this test can be used) for the duration of  the COVID-19 declaration under Section 564(b)(1) of the A ct, 21 U.S.C. section 360bbb-3(b)(1), unless the authorization is terminated or revoked sooner.     Influenza A by PCR NEGATIVE NEGATIVE Final   Influenza B by PCR NEGATIVE NEGATIVE Final    Comment: (NOTE) The Xpert Xpress SARS-CoV-2/FLU/RSV plus assay is intended as an aid in the diagnosis of influenza from Nasopharyngeal swab specimens and should not be used as a sole basis for treatment. Nasal washings  and aspirates are unacceptable for Xpert Xpress SARS-CoV-2/FLU/RSV testing.  Fact Sheet for Patients: BloggerCourse.com  Fact Sheet for Healthcare Providers: SeriousBroker.it  This test is not yet approved or cleared by the [place]  FDA and has been authorized for detection and/or diagnosis of SARS-CoV-2 by FDA under an Emergency Use Authorization (EUA). This EUA will remain in effect (meaning this test can be used) for the duration of the COVID-19 declaration under Section 564(b)(1) of the Act, 21 U.S.C. section 360bbb-3(b)(1), unless the authorization is terminated or revoked.     Resp Syncytial Virus by PCR NEGATIVE NEGATIVE Final    Comment: (NOTE) Fact Sheet for Patients: BloggerCourse.com  Fact Sheet for Healthcare Providers: SeriousBroker.it  This test is not yet approved or cleared by the [place]  FDA and has been authorized for detection and/or diagnosis of SARS-CoV-2 by FDA under an Emergency Use Authorization (EUA). This EUA will remain in effect (meaning this test can be used) for the duration of the COVID-19 declaration under Section 564(b)(1) of the Act, 21 U.S.C. section 360bbb-3(b)(1), unless the authorization is terminated or revoked.  Performed at [facility], [address] [place], [place] [address]          Radiology Studies: DG Chest Portable 1 View Result Date: 05/19/2024 CLINICAL DATA:  Shortness of breath. EXAM: PORTABLE CHEST 1 VIEW COMPARISON:  November 07, 2020. FINDINGS: Stable cardiomediastinal silhouette. Left lung is clear. Increased right basilar opacity is noted concerning for pneumonia or atelectasis. Small right pleural effusion may be present. Bony thorax is unremarkable. Sternotomy wires are noted. IMPRESSION: Increased right basilar opacity is noted concerning for pneumonia or atelectasis. Small right pleural  effusion may be present. Followup PA and lateral chest X-ray is recommended in 3-4 weeks following trial of antibiotic therapy to ensure resolution and exclude underlying malignancy. Electronically Signed   By: Lynwood Landy Raddle M.D.   On: 05/19/2024 12:08        Scheduled Meds:  dexamethasone   6 mg Oral Q24H   doxycycline   100 mg Oral Q12H   heparin   5,000 Units Subcutaneous Q8H   insulin  aspart  0-15 Units Subcutaneous TID WC   insulin  aspart  0-5 Units Subcutaneous QHS   insulin  glargine  30 Units Subcutaneous QHS   ipratropium-albuterol   3 mL Nebulization TID   pantoprazole   40 mg Oral Daily   Continuous Infusions:  cefTRIAXone  (ROCEPHIN )  IV 2 g (05/20/24 1417)   remdesivir  100 mg in sodium chloride  0.9 % 100 mL IVPB 100 mg (05/20/24 0942)     LOS: 1 day    Time spent: 40 minutes    Toribio Hummer, MD [facility]   To contact the attending provider between 7A-7P or the covering provider during after hours 7P-7A, please log into the web site [email or web address] and access using universal [place] password for that web site. If you do not have the password, please call the hospital operator.  05/20/2024, 4:45 PM

## 2024-05-20 NOTE — Plan of Care (Signed)

## 2024-05-20 NOTE — Plan of Care (Signed)

## 2024-05-21 DIAGNOSIS — E1121 Type 2 diabetes mellitus with diabetic nephropathy: Secondary | ICD-10-CM | POA: Diagnosis not present

## 2024-05-21 DIAGNOSIS — U071 COVID-19: Secondary | ICD-10-CM | POA: Diagnosis not present

## 2024-05-21 DIAGNOSIS — E785 Hyperlipidemia, unspecified: Secondary | ICD-10-CM | POA: Diagnosis not present

## 2024-05-21 DIAGNOSIS — I4891 Unspecified atrial fibrillation: Secondary | ICD-10-CM | POA: Diagnosis not present

## 2024-05-21 LAB — CBC WITH DIFFERENTIAL/PLATELET
Abs Immature Granulocytes: 0.11 K/uL — ABNORMAL HIGH (ref 0.00–0.07)
Basophils Absolute: 0 K/uL (ref 0.0–0.1)
Basophils Relative: 0 %
Eosinophils Absolute: 0 K/uL (ref 0.0–0.5)
Eosinophils Relative: 0 %
HCT: 41.6 % (ref 36.0–46.0)
Hemoglobin: 12.4 g/dL (ref 12.0–15.0)
Immature Granulocytes: 1 %
Lymphocytes Relative: 5 %
Lymphs Abs: 0.6 K/uL — ABNORMAL LOW (ref 0.7–4.0)
MCH: 32 pg (ref 26.0–34.0)
MCHC: 29.8 g/dL — ABNORMAL LOW (ref 30.0–36.0)
MCV: 107.2 fL — ABNORMAL HIGH (ref 80.0–100.0)
Monocytes Absolute: 0.7 K/uL (ref 0.1–1.0)
Monocytes Relative: 6 %
Neutro Abs: 10.3 K/uL — ABNORMAL HIGH (ref 1.7–7.7)
Neutrophils Relative %: 88 %
Platelets: 157 K/uL (ref 150–400)
RBC: 3.88 MIL/uL (ref 3.87–5.11)
RDW: 14.8 % (ref 11.5–15.5)
WBC: 11.7 K/uL — ABNORMAL HIGH (ref 4.0–10.5)
nRBC: 0 % (ref 0.0–0.2)

## 2024-05-21 LAB — BASIC METABOLIC PANEL WITH GFR
Anion gap: 15 (ref 5–15)
BUN: 45 mg/dL — ABNORMAL HIGH (ref 8–23)
CO2: 22 mmol/L (ref 22–32)
Calcium: 9.8 mg/dL (ref 8.9–10.3)
Chloride: 102 mmol/L (ref 98–111)
Creatinine, Ser: 1.33 mg/dL — ABNORMAL HIGH (ref 0.44–1.00)
GFR, Estimated: 39 mL/min — ABNORMAL LOW (ref >60)
Glucose, Bld: 216 mg/dL — ABNORMAL HIGH (ref 70–99)
Potassium: 4.8 mmol/L (ref 3.5–5.1)
Sodium: 139 mmol/L (ref 135–145)

## 2024-05-21 LAB — GLUCOSE, CAPILLARY
Glucose-Capillary: 188 mg/dL — ABNORMAL HIGH (ref 70–99)
Glucose-Capillary: 231 mg/dL — ABNORMAL HIGH (ref 70–99)
Glucose-Capillary: 190 mg/dL — ABNORMAL HIGH (ref 70–99)
Glucose-Capillary: 374 mg/dL — ABNORMAL HIGH (ref 70–99)

## 2024-05-21 MED ORDER — BUPROPION HCL ER (XL) 150 MG PO TB24
150.0000 mg | ORAL_TABLET | Freq: Every day | ORAL | Status: DC
  Administered 2024-05-21 – 2024-05-25 (×5): 150 mg via ORAL
  Filled 2024-05-21 (×5): qty 1

## 2024-05-21 MED ORDER — AMLODIPINE BESYLATE 10 MG PO TABS
10.0000 mg | ORAL_TABLET | Freq: Every day | ORAL | Status: DC
  Administered 2024-05-21 – 2024-05-25 (×5): 10 mg via ORAL
  Filled 2024-05-21 (×5): qty 1

## 2024-05-21 MED ORDER — DEXAMETHASONE 4 MG PO TABS
4.0000 mg | ORAL_TABLET | Freq: Every day | ORAL | Status: DC
  Administered 2024-05-22 – 2024-05-25 (×4): 4 mg via ORAL
  Filled 2024-05-21 (×4): qty 1

## 2024-05-21 MED ORDER — IPRATROPIUM-ALBUTEROL 0.5-2.5 (3) MG/3ML IN SOLN
3.0000 mL | Freq: Three times a day (TID) | RESPIRATORY_TRACT | Status: DC
  Administered 2024-05-21 – 2024-05-25 (×11): 3 mL via RESPIRATORY_TRACT
  Filled 2024-05-21 (×12): qty 3

## 2024-05-21 MED ORDER — METOPROLOL SUCCINATE ER 50 MG PO TB24
75.0000 mg | ORAL_TABLET | Freq: Every day | ORAL | Status: DC
  Administered 2024-05-21 – 2024-05-25 (×5): 75 mg via ORAL
  Filled 2024-05-21 (×5): qty 1

## 2024-05-21 MED ORDER — VENLAFAXINE HCL ER 75 MG PO CP24
75.0000 mg | ORAL_CAPSULE | Freq: Every day | ORAL | Status: DC
  Administered 2024-05-22 – 2024-05-25 (×4): 75 mg via ORAL
  Filled 2024-05-21 (×4): qty 1

## 2024-05-21 MED ORDER — ASPIRIN 81 MG PO CHEW
81.0000 mg | CHEWABLE_TABLET | Freq: Every evening | ORAL | Status: DC
  Administered 2024-05-21 – 2024-05-25 (×5): 81 mg via ORAL
  Filled 2024-05-21 (×5): qty 1

## 2024-05-21 MED ORDER — SODIUM CHLORIDE 3 % IN NEBU
4.0000 mL | INHALATION_SOLUTION | Freq: Every day | RESPIRATORY_TRACT | Status: AC
Start: 2024-05-21 — End: 2024-05-23
  Administered 2024-05-21 – 2024-05-23 (×3): 4 mL via RESPIRATORY_TRACT
  Filled 2024-05-21 (×3): qty 4

## 2024-05-21 MED ORDER — FLUTICASONE PROPIONATE 50 MCG/ACT NA SUSP
1.0000 | Freq: Every day | NASAL | Status: DC
  Administered 2024-05-21 – 2024-05-25 (×5): 1 via NASAL
  Filled 2024-05-21: qty 16

## 2024-05-21 MED ORDER — GUAIFENESIN ER 600 MG PO TB12
600.0000 mg | ORAL_TABLET | Freq: Two times a day (BID) | ORAL | Status: DC
  Administered 2024-05-21 – 2024-05-25 (×8): 600 mg via ORAL
  Filled 2024-05-21 (×8): qty 1

## 2024-05-21 MED ORDER — ATORVASTATIN CALCIUM 20 MG PO TABS
20.0000 mg | ORAL_TABLET | Freq: Every day | ORAL | Status: DC
  Administered 2024-05-21: 20 mg via ORAL
  Filled 2024-05-21: qty 1

## 2024-05-21 MED ORDER — INSULIN GLARGINE 100 UNIT/ML ~~LOC~~ SOLN
40.0000 [IU] | Freq: Every day | SUBCUTANEOUS | Status: DC
  Administered 2024-05-21 – 2024-05-24 (×4): 40 [IU] via SUBCUTANEOUS
  Filled 2024-05-21 (×5): qty 0.4

## 2024-05-21 NOTE — Progress Notes (Signed)
 PROGRESS NOTE    Catherine Andrews  FMW:[id number] DOB: 12/21/1934 DOA: 05/19/2024 PCP: Fleeta Valeria Mayo, MD    Chief Complaint  Patient presents with   Shortness of Breath    Brief Narrative:  Patient is a 89 year old female history of insulin -dependent type 2 diabetes, hypertension, melanoma, metastatic lung cancer on chemotherapy, paroxysmal A-fib on aspirin  admitted to the hospital with acute hypoxic respiratory failure felt secondary to her COVID pneumonia and CAP.   Assessment & Plan:   Principal Problem:   Pneumonia due to COVID-19 virus Active Problems:   Depression   Gastroesophageal reflux disease   Hyperlipidemia   Stage 3b chronic kidney disease (HCC)   Essential hypertension   Atrial fibrillation (HCC)   Diabetic nephropathy associated with type 2 diabetes mellitus (HCC)  #1 acute hypoxic respiratory failure secondary to COVID-pneumonia/CAP - Patient noted to have been exposed to someone at church with cold-like symptoms/upper respiratory symptoms over the weekend and noted to have a cough, dyspnea on exertion and progressive fatigue for the past 3 to 4 days prior to admission. - SARS coronavirus 2 PCR positive. - Influenza A and B PCR negative. - Chest x-ray done with increased right basilar opacity concerning for pneumonia or atelectasis. - Patient noted on presentation to have had fevers, leukocytosis, dyspnea, cough, and as such patient placed on empiric antibiotics of doxycycline  and Rocephin  to also cover CAP. - Patient also noted to have met criteria for antiviral COVID therapy due to hypoxia, symptoms occurring <5 days and assessed patient placed on remdesivir  and oral Decadron . -Status post completion of 3 days IV remdesivir  today. -Patient with complaints of bad dreams and as such we will decrease Decadron  dose to 4 mg daily. - Continue scheduled DuoNebs.  Continue PPI. -Placed on 3% hypertonic nebulizers. -Continue flutter valve, incentive spirometry. -Place  on Mucinex  600 mg twice daily. - Supportive care.  2.  Paroxysmal A-fib -Continue Toprol -XL for rate control. - Continue home regimen aspirin .  3.  Well-controlled insulin -dependent type 2 diabetes -Hemoglobin A1c of 6.1 on 01/13/2024. - CBG noted at 188 this morning. - Patient on steroids. - Increase Lantus  back to home dose of 40 units daily, SSI.   4.  CKD stage IIIb - Stable.  5.  Hyperlipidemia - Will hold statin.   6.  Hypertension - Norvasc .    7.  Depression - Continue Wellbutrin .  8.  GERD -PPI.   DVT prophylaxis: Heparin  Code Status: DNR Family Communication: Updated patient.  Updated daughter-in-law, grandson, granddaughter at bedside.   Disposition: TBD  Status is: Inpatient Remains inpatient appropriate because: Severity of illness   Consultants:  None  Procedures:  Chest x-ray 05/19/2024   Antimicrobials:  Anti-infectives (From admission, onward)    Start     Dose/Rate Route Frequency Ordered Stop   05/20/24 1300  cefTRIAXone  (ROCEPHIN ) 1 g in sodium chloride  0.9 % 100 mL IVPB  Status:  Discontinued        1 g 200 mL/hr over 30 Minutes Intravenous Every 24 hours 05/19/24 1257 05/20/24 0920   05/20/24 1300  cefTRIAXone  (ROCEPHIN ) 2 g in sodium chloride  0.9 % 100 mL IVPB        2 g 200 mL/hr over 30 Minutes Intravenous Every 24 hours 05/20/24 0920     05/20/24 1000  remdesivir  100 mg in sodium chloride  0.9 % 100 mL IVPB       Placed in Followed by Linked Group   100 mg 200 mL/hr over 30 Minutes Intravenous Daily  05/19/24 1256 05/21/24 1051   05/19/24 2200  doxycycline  (VIBRA -TABS) tablet 100 mg        100 mg Oral Every 12 hours 05/19/24 1257     05/19/24 1330  remdesivir  200 mg in sodium chloride  0.9% 250 mL IVPB       Placed in Followed by Linked Group   200 mg 580 mL/hr over 30 Minutes Intravenous Once 05/19/24 1256 05/19/24 1543   05/19/24 1230  cefTRIAXone  (ROCEPHIN ) 1 g in sodium chloride  0.9 % 100 mL IVPB        1 g 200 mL/hr over  30 Minutes Intravenous  Once 05/19/24 1222 05/19/24 1508   05/19/24 1230  doxycycline  (VIBRA -TABS) tablet 100 mg        100 mg Oral  Once 05/19/24 1222 05/19/24 1414         Subjective: Patient laying in bed.  Feels she is slowly improving since admission.  Patient with some complaints of weird dreams.  Denies any chest pain.  Still with shortness of breath but slowly improving since admission.  Family at bedside.  Objective: Vitals:   05/20/24 2105 05/21/24 0615 05/21/24 0820 05/21/24 0825  BP: (!) 143/57 (!) 144/50    Pulse: 84 83    Resp: 12 12    Temp: 98.2 F (36.8 C) 97.9 F (36.6 C)    TempSrc: Oral Oral    SpO2: 98% 97% 98% 98%  Weight:      Height:        Intake/Output Summary (Last 24 hours) at 05/21/2024 1337 Last data filed at 05/21/2024 0615 Gross per 24 hour  Intake 200 ml  Output 1850 ml  Net -1650 ml   Filed Weights   05/19/24 1032  Weight: 74.8 kg    Examination:  General exam: NAD.  Dry mucous membranes.  Respiratory system: Scattered coarse breath sounds.  No significant wheezing noted.  No significant crackles.  Speaking in full sentences.  On 3 L nasal cannula with sats of 99%.  Cardiovascular system: RRR no murmurs rubs or gallops.  No JVD.  No pitting lower extremity edema. Gastrointestinal system: Abdomen is soft, nontender, nondistended, positive bowel sounds.  No rebound.  No guarding.  Central nervous system: Alert and oriented. No focal neurological deficits. Extremities: Symmetric 5 x 5 power. Skin: No rashes, lesions or ulcers Psychiatry: Judgement and insight appear normal. Mood & affect appropriate.     Data Reviewed: I have personally reviewed following labs and imaging studies  CBC: Recent Labs  Lab 05/19/24 1126 05/20/24 0426 05/21/24 0336  WBC 15.9* 13.8* 11.7*  NEUTROABS  --   --  10.3*  HGB 12.2 11.5* 12.4  HCT 38.8 37.6 41.6  MCV 106.0* 106.5* 107.2*  PLT 151 133* 157    Basic Metabolic Panel: Recent Labs  Lab  05/19/24 1125 05/20/24 0426 05/21/24 0336  NA 131* 140 139  K 4.9 4.2 4.8  CL 92* 102 102  CO2 22 23 22   GLUCOSE 363* 166* 216*  BUN 36* 42* 45*  CREATININE 1.69* 1.56* 1.33*  CALCIUM  9.0 9.2 9.8    GFR: Estimated Creatinine Clearance: 29.5 mL/min (A) (by C-G formula based on SCr of 1.33 mg/dL (H)).  Liver Function Tests: Recent Labs  Lab 05/19/24 1125  AST 23  ALT 15  ALKPHOS 90  BILITOT 0.9  PROT 7.0  ALBUMIN 3.8    CBG: Recent Labs  Lab 05/20/24 1150 05/20/24 1649 05/20/24 2106 05/21/24 0817 05/21/24 1158  GLUCAP 152* 121* 218*  188* 231*     Recent Results (from the past 240 hours)  Resp panel by RT-PCR (RSV, Flu A&B, Covid) Anterior Nasal Swab     Status: Abnormal   Collection Time: 05/19/24 11:25 AM   Specimen: Anterior Nasal Swab  Result Value Ref Range Status   SARS Coronavirus 2 by RT PCR POSITIVE (A) NEGATIVE Final    Comment: (NOTE) SARS-CoV-2 target nucleic acids are DETECTED.  The SARS-CoV-2 RNA is generally detectable in upper respiratory specimens during the acute phase of infection. Positive results are indicative of the presence of the identified virus, but do not rule out bacterial infection or co-infection with other pathogens not detected by the test. Clinical correlation with patient history and other diagnostic information is necessary to determine patient infection status. The expected result is Negative.  Fact Sheet for Patients: BloggerCourse.com  Fact Sheet for Healthcare Providers: SeriousBroker.it  This test is not yet approved or cleared by the [place]  FDA and  has been authorized for detection and/or diagnosis of SARS-CoV-2 by FDA under an Emergency Use Authorization (EUA).  This EUA will remain in effect (meaning this test can be used) for the duration of  the COVID-19 declaration under Section 564(b)(1) of the A ct, 21 U.S.C. section 360bbb-3(b)(1), unless the  authorization is terminated or revoked sooner.     Influenza A by PCR NEGATIVE NEGATIVE Final   Influenza B by PCR NEGATIVE NEGATIVE Final    Comment: (NOTE) The Xpert Xpress SARS-CoV-2/FLU/RSV plus assay is intended as an aid in the diagnosis of influenza from Nasopharyngeal swab specimens and should not be used as a sole basis for treatment. Nasal washings and aspirates are unacceptable for Xpert Xpress SARS-CoV-2/FLU/RSV testing.  Fact Sheet for Patients: BloggerCourse.com  Fact Sheet for Healthcare Providers: SeriousBroker.it  This test is not yet approved or cleared by the [place]  FDA and has been authorized for detection and/or diagnosis of SARS-CoV-2 by FDA under an Emergency Use Authorization (EUA). This EUA will remain in effect (meaning this test can be used) for the duration of the COVID-19 declaration under Section 564(b)(1) of the Act, 21 U.S.C. section 360bbb-3(b)(1), unless the authorization is terminated or revoked.     Resp Syncytial Virus by PCR NEGATIVE NEGATIVE Final    Comment: (NOTE) Fact Sheet for Patients: BloggerCourse.com  Fact Sheet for Healthcare Providers: SeriousBroker.it  This test is not yet approved or cleared by the [place]  FDA and has been authorized for detection and/or diagnosis of SARS-CoV-2 by FDA under an Emergency Use Authorization (EUA). This EUA will remain in effect (meaning this test can be used) for the duration of the COVID-19 declaration under Section 564(b)(1) of the Act, 21 U.S.C. section 360bbb-3(b)(1), unless the authorization is terminated or revoked.  Performed at [facility], [address], [place], [place] [address]          Radiology Studies: No results found.       Scheduled Meds:  amLODipine   10 mg Oral Daily   aspirin   81 mg Oral QPM   atorvastatin   20 mg Oral Daily    buPROPion   150 mg Oral Daily   dexamethasone   6 mg Oral Q24H   doxycycline   100 mg Oral Q12H   fluticasone   1 spray Each Nare Daily   heparin   5,000 Units Subcutaneous Q8H   insulin  aspart  0-15 Units Subcutaneous TID WC   insulin  aspart  0-5 Units Subcutaneous QHS   insulin  glargine  30 Units Subcutaneous QHS  ipratropium-albuterol   3 mL Nebulization TID   metoprolol  succinate  75 mg Oral Daily   pantoprazole   40 mg Oral Daily   [START ON 05/22/2024] venlafaxine  XR  75 mg Oral Q breakfast   Continuous Infusions:  cefTRIAXone  (ROCEPHIN )  IV 2 g (05/21/24 1212)     LOS: 2 days    Time spent: 40 minutes    Toribio Hummer, MD [facility]   To contact the attending provider between 7A-7P or the covering provider during after hours 7P-7A, please log into the web site [email or web address] and access using universal [place] password for that web site. If you do not have the password, please call the hospital operator.  05/21/2024, 1:37 PM

## 2024-05-22 DIAGNOSIS — I4891 Unspecified atrial fibrillation: Secondary | ICD-10-CM | POA: Diagnosis not present

## 2024-05-22 DIAGNOSIS — E785 Hyperlipidemia, unspecified: Secondary | ICD-10-CM | POA: Diagnosis not present

## 2024-05-22 DIAGNOSIS — E1121 Type 2 diabetes mellitus with diabetic nephropathy: Secondary | ICD-10-CM | POA: Diagnosis not present

## 2024-05-22 DIAGNOSIS — U071 COVID-19: Secondary | ICD-10-CM | POA: Diagnosis not present

## 2024-05-22 LAB — BASIC METABOLIC PANEL WITH GFR
Anion gap: 15 (ref 5–15)
BUN: 50 mg/dL — ABNORMAL HIGH (ref 8–23)
CO2: 21 mmol/L — ABNORMAL LOW (ref 22–32)
Calcium: 9.8 mg/dL (ref 8.9–10.3)
Chloride: 103 mmol/L (ref 98–111)
Creatinine, Ser: 1.43 mg/dL — ABNORMAL HIGH (ref 0.44–1.00)
GFR, Estimated: 35 mL/min — ABNORMAL LOW (ref >60)
Glucose, Bld: 246 mg/dL — ABNORMAL HIGH (ref 70–99)
Potassium: 4.5 mmol/L (ref 3.5–5.1)
Sodium: 138 mmol/L (ref 135–145)

## 2024-05-22 LAB — CBC
HCT: 38.4 % (ref 36.0–46.0)
Hemoglobin: 12.1 g/dL (ref 12.0–15.0)
MCH: 33 pg (ref 26.0–34.0)
MCHC: 31.5 g/dL (ref 30.0–36.0)
MCV: 104.6 fL — ABNORMAL HIGH (ref 80.0–100.0)
Platelets: 188 K/uL (ref 150–400)
RBC: 3.67 MIL/uL — ABNORMAL LOW (ref 3.87–5.11)
RDW: 14.6 % (ref 11.5–15.5)
WBC: 11.9 K/uL — ABNORMAL HIGH (ref 4.0–10.5)
nRBC: 0 % (ref 0.0–0.2)

## 2024-05-22 LAB — GLUCOSE, CAPILLARY
Glucose-Capillary: 202 mg/dL — ABNORMAL HIGH (ref 70–99)
Glucose-Capillary: 185 mg/dL — ABNORMAL HIGH (ref 70–99)
Glucose-Capillary: 275 mg/dL — ABNORMAL HIGH (ref 70–99)
Glucose-Capillary: 320 mg/dL — ABNORMAL HIGH (ref 70–99)

## 2024-05-22 MED ORDER — FUROSEMIDE 40 MG PO TABS
40.0000 mg | ORAL_TABLET | ORAL | Status: DC
Start: 2024-05-22 — End: 2024-05-24
  Administered 2024-05-22: 40 mg via ORAL
  Filled 2024-05-22: qty 1

## 2024-05-22 MED ORDER — HYDRALAZINE HCL 50 MG PO TABS
50.0000 mg | ORAL_TABLET | Freq: Three times a day (TID) | ORAL | Status: DC
  Administered 2024-05-22 – 2024-05-25 (×10): 50 mg via ORAL
  Filled 2024-05-22 (×10): qty 1

## 2024-05-22 NOTE — TOC Initial Note (Signed)
 Transition of Care [facility]) - Initial/Assessment Note    Patient Details  Name: Catherine Andrews MRN: [id number] Date of Birth: May 27, 1935  Transition of Care [facility]) CM/SW Contact:    Tawni CHRISTELLA Eva, LCSW Phone Number: 05/22/2024, 9:43 AM  Clinical Narrative:                  Pt is from Independent Living with [facility]. IP care management to follow.   Expected Discharge Plan:  (TBD) Barriers to Discharge: Continued Medical Work up   Patient Goals and CMS Choice            Expected Discharge Plan and Services                                              Prior Living Arrangements/Services                       Activities of Daily Living   ADL Screening (condition at time of admission) Independently performs ADLs?: No Does the patient have a NEW difficulty with bathing/dressing/toileting/self-feeding that is expected to last >3 days?: No Does the patient have a NEW difficulty with getting in/out of bed, walking, or climbing stairs that is expected to last >3 days?: No Does the patient have a NEW difficulty with communication that is expected to last >3 days?: No Is the patient deaf or have difficulty hearing?: Yes Does the patient have difficulty seeing, even when wearing glasses/contacts?: No Does the patient have difficulty concentrating, remembering, or making decisions?: Yes  Permission Sought/Granted                  Emotional Assessment              Admission diagnosis:  Hypoxia [R09.02] Pneumonia due to COVID-19 virus [U07.1, J12.82] COVID-19 [U07.1] Patient Active Problem List   Diagnosis Date Noted   Pneumonia due to COVID-19 virus 05/19/2024   Diabetic nephropathy associated with type 2 diabetes mellitus (HCC) 04/13/2024   Diabetes mellitus (HCC) 01/13/2024   Urinary tract infection without hematuria 12/02/2023   Atrial fibrillation (HCC) 07/15/2023   History of lung cancer 07/15/2023   History of bladder cancer  07/15/2023   BMI 29.0-29.9,adult 07/15/2023   Essential hypertension 10/08/2022   Stage 3 chronic kidney disease (HCC) 10/08/2022   PAD (peripheral artery disease) (HCC) 04/21/2022   Personal history of malignant neoplasm of breast 02/12/2021   Stage 3b chronic kidney disease (HCC) 02/12/2021   Hypocalcemia 11/07/2020   Acquired atrophy of thyroid  10/16/2020   Chronic diastolic congestive heart failure (HCC) 10/16/2020   Pain due to onychomycosis of toenails of both feet 10/16/2020   Pain in left foot 01/01/2020   Abnormal PET scan of colon 06/19/2019   Anemia 11/11/2017   Black stools 11/11/2017   Hyperthyroidism 10/26/2017   Chronic fatigue 09/22/2017   Lethargy 09/17/2017   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) 04/15/2017   Presence of arterial stent 04/23/2016   Left foot drop 01/31/2016   Wound of left leg 01/31/2016   Non-healing wound of lower extremity 01/17/2016   Cellulitis and abscess of leg 12/05/2015   Diabetic polyneuropathy associated with type 2 diabetes mellitus (HCC) 10/07/2015   Presence of aortocoronary bypass graft 07/28/2015   Gastroesophageal reflux disease 02/12/2015   Atrial fibrillation with RVR (HCC) 11/12/2014   Depression 11/12/2014  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 [place] [address] Phone: [phone] Fax: [phone]  [facility] - [place], [place] - [address] [place] [address] Phone: [phone] Fax:  [phone]  [facility] - [place], [place] - [address] [place] [address] Phone: [phone] Fax: [phone]     Social Drivers of Health (SDOH) Social History: SDOH Screenings   Food Insecurity: No Food Insecurity (05/19/2024)  Housing: Low Risk  (05/19/2024)  Transportation Needs: No Transportation Needs (05/19/2024)  Utilities: Not At Risk (05/19/2024)  Social Connections: Socially Isolated (05/19/2024)  Tobacco Use: Medium Risk (05/19/2024)   SDOH Interventions:     Readmission Risk Interventions     No data to display

## 2024-05-22 NOTE — Evaluation (Signed)
 Occupational Therapy Evaluation Patient Details Name: Catherine Andrews MRN: [id number] DOB: 03-27-1935 Today's Date: 05/22/2024   History of Present Illness   89 yo female admitted with COVID Pna. Hx of DM, melanoma, met lung Ca, Afib, CHF, vertigo, MI, anemia, syncope, CABG, L foot drop, CKD     Clinical Impressions PTA, patient lives in ILF of [facility] and was mod I with mobility in manual and power w/c to access all spaces incl elevator, laundry and dining room in either, mod I BADL's and light am meal. ILF manages higher level meals and housekeeping, patient orders online groceries and does laundry and family assists with transportation via w/c. Patient reports she does not use O2 at baseline but does have vision deficits. Currently, patient presents with deficits outlined below (see OT Problem List for details) most significantly supplemental O2 dep with decreased activity tolerance, strength and balance deficits impacting BADL's and functional mobility performance.  Patient requires continued Acute care hospital level OT services to progress safety and functional performance and allow for discharge. OT recommending HHOT services upon discharge from acute setting and patient reports having all necessary DME/AE in the home.     If plan is discharge home, recommend the following:   Two people to help with walking and/or transfers;A little help with bathing/dressing/bathroom;Assistance with cooking/housework;Assist for transportation;Help with stairs or ramp for entrance     Functional Status Assessment   Patient has had a recent decline in their functional status and demonstrates the ability to make significant improvements in function in a reasonable and predictable amount of time.     Equipment Recommendations   None recommended by OT      Precautions/Restrictions   Precautions Precautions: Fall Precaution/Restrictions Comments: monitor O2 Restrictions Weight Bearing  Restrictions Per Provider Order: No     Mobility Bed Mobility Overal bed mobility: Needs Assistance Bed Mobility: Supine to Sit, Sit to Supine     Supine to sit: Modified independent (Device/Increase time), HOB elevated          Transfers Overall transfer level: Needs assistance   Transfers: Sit to/from Stand, Bed to chair/wheelchair/BSC Sit to Stand: Contact guard assist Stand pivot transfers: Contact guard assist         General transfer comment: min cues for hand placement and O2 management assist      Balance Overall balance assessment: Needs assistance Sitting-balance support: Feet supported, No upper extremity supported Sitting balance-Leahy Scale: Good     Standing balance support: During functional activity Standing balance-Leahy Scale: Fair                             ADL either performed or assessed with clinical judgement   ADL Overall ADL's : Needs assistance/impaired Eating/Feeding: Independent   Grooming: Wash/dry hands;Wash/dry face;Oral care;Set up;Sitting   Upper Body Bathing: Set up;Sitting   Lower Body Bathing: Minimal assistance;Sit to/from stand   Upper Body Dressing : Contact guard assist;Sitting   Lower Body Dressing: Minimal assistance;Sit to/from stand   Toilet Transfer: Contact guard assist;BSC/3in1   Toileting- Clothing Manipulation and Hygiene: Minimal assistance Toileting - Clothing Manipulation Details (indicate cue type and reason): has purewick as well     Functional mobility during ADLs: Contact guard assist General ADL Comments: cues for pacing and breathing throughout tasks     Vision Baseline Vision/History: 1 Wears glasses;6 Macular Degeneration Ability to See in Adequate Light: 1 Impaired Patient Visual Report: No change from baseline;Central  vision impairment Vision Assessment?: Wears glasses for reading (large print only) Additional Comments: no changes from baseline, visual acuity fluctuates  throughout the day     Perception Perception: Impaired Preception Impairment Details: Figure ground     Praxis Praxis: WFL       Pertinent Vitals/Pain Pain Assessment Pain Assessment: No/denies pain     Extremity/Trunk Assessment Upper Extremity Assessment Upper Extremity Assessment: Generalized weakness;Right hand dominant   Lower Extremity Assessment Lower Extremity Assessment: Generalized weakness;Defer to PT evaluation   Cervical / Trunk Assessment Cervical / Trunk Assessment: Normal   Communication Communication Communication: Impaired Factors Affecting Communication: Hearing impaired (wears B hearing aides)   Cognition Arousal: Alert Behavior During Therapy: WFL for tasks assessed/performed Cognition: No apparent impairments                               Following commands: Intact       Cueing  General Comments   Cueing Techniques: Verbal cues  on 3 ltrs O2 at rest 97%, removed for light BADL's with drop in sats just to 89-91% with min cues for breathing s/p breathing tx, reaplied 3 ltrs and SpO2 back to 97%           Home Living Family/patient expects to be discharged to:: Private residence Living Arrangements: Alone Available Help at Discharge: Neighbor;Family;Available PRN/intermittently Type of Home: Independent living facility Home Access: Elevator     Home Layout: One level     Bathroom Shower/Tub: Walk-in Pensions consultant: Handicapped height Bathroom Accessibility: Yes How Accessible: Accessible via wheelchair Home Equipment: Wheelchair - power;Wheelchair - manual;Adaptive equipment;Hand held shower head;Grab bars - tub/shower;Grab bars - toilet;Shower seat   Additional Comments: no O2 at baseline      Prior Functioning/Environment Prior Level of Function : Independent/Modified Independent             Mobility Comments: stand pivot bed<>WC; nonambulatory ADLs Comments: mod I all tasks including  laundry on her residence floor and elevator access to dining room, completes light am meals mod I    OT Problem List: Decreased strength;Decreased activity tolerance;Impaired balance (sitting and/or standing);Decreased knowledge of use of DME or AE;Decreased knowledge of precautions;Cardiopulmonary status limiting activity   OT Treatment/Interventions: Self-care/ADL training;Therapeutic exercise;Energy conservation;DME and/or AE instruction;Therapeutic activities;Visual/perceptual remediation/compensation;Patient/family education;Balance training      OT Goals(Current goals can be found in the care plan section)   Acute Rehab OT Goals Patient Stated Goal: to get off the O2 OT Goal Formulation: With patient Time For Goal Achievement: 06/05/24 Potential to Achieve Goals: Good ADL Goals Pt Will Perform Lower Body Bathing: with set-up;sitting/lateral leans Pt Will Perform Lower Body Dressing: with supervision;sitting/lateral leans Pt Will Transfer to Toilet: with set-up;grab bars Pt Will Perform Toileting - Clothing Manipulation and hygiene: with set-up;sitting/lateral leans Pt Will Perform Tub/Shower Transfer: Shower transfer;with supervision;Stand pivot transfer;grab bars;shower seat Pt/caregiver will Perform Home Exercise Program: Both right and left upper extremity;Independently;With written HEP provided Additional ADL Goal #1: Patient will teach back 4/5 ECT's for BADL's and mobility with min cues   OT Frequency:  Min 2X/week       AM-PAC OT 6 Clicks Daily Activity     Outcome Measure Help from another person eating meals?: None Help from another person taking care of personal grooming?: A Little Help from another person toileting, which includes using toliet, bedpan, or urinal?: A Little Help from another person bathing (including washing, rinsing, drying)?:  A Little Help from another person to put on and taking off regular upper body clothing?: A Little Help from another  person to put on and taking off regular lower body clothing?: A Little 6 Click Score: 19   End of Session Equipment Utilized During Treatment: Gait belt;Oxygen Nurse Communication: Mobility status  Activity Tolerance: Patient tolerated treatment well Patient left: in bed;with call bell/phone within reach;with bed alarm set  OT Visit Diagnosis: Unsteadiness on feet (R26.81);Other abnormalities of gait and mobility (R26.89);Muscle weakness (generalized) (M62.81);Low vision, both eyes (H54.2)                Time: 1450-1520 OT Time Calculation (min): 30 min Charges:  OT General Charges $OT Visit: 1 Visit OT Evaluation $OT Eval Low Complexity: 1 Low OT Treatments $Self Care/Home Management : 8-22 mins  Kimbrely Buckel OT/L Acute Rehabilitation Department  [phone]  05/22/2024, 3:43 PM

## 2024-05-22 NOTE — Evaluation (Signed)
 Physical Therapy Evaluation Patient Details Name: Catherine Andrews MRN: [id number] DOB: 1935/01/12 Today's Date: 05/22/2024  History of Present Illness  89 yo female admitted with COVID Pna. Hx of DM, melanoma, met lung Ca, Afib, CHF, vertigo, MI, anemia, syncope, CABG, L foot drop, CKD  Clinical Impression  On eval, pt required close CGA for mobility. She was able to stand and perform a pivot over to recliner. Pt reports feeling better but a bit weak compared to her baseline. Removed [place] O2 for session: O2 96% on 3L at rest, 90% on RA with activity however pt did have some dyspnea and increased work of breathing. Placed 3L [place] O2 back on pt at end of session-95%. Will plan to follow and progress activity as tolerated. Recommend HHPT f/u.       If plan is discharge home, recommend the following: A little help with walking and/or transfers;A little help with bathing/dressing/bathroom;Assistance with cooking/housework;Assist for transportation;Help with stairs or ramp for entrance   Can travel by private vehicle        Equipment Recommendations None recommended by PT  Recommendations for Other Services       Functional Status Assessment Patient has had a recent decline in their functional status and demonstrates the ability to make significant improvements in function in a reasonable and predictable amount of time.     Precautions / Restrictions Precautions Precautions: Fall Precaution/Restrictions Comments: monitor O2 Restrictions Weight Bearing Restrictions Per Provider Order: No      Mobility  Bed Mobility Overal bed mobility: Needs Assistance Bed Mobility: Supine to Sit     Supine to sit: Modified independent (Device/Increase time), HOB elevated, Used rails          Transfers Overall transfer level: Needs assistance   Transfers: Sit to/from Stand, Bed to chair/wheelchair/BSC Sit to Stand: Contact guard assist Stand pivot transfers: Contact guard assist          General transfer comment: Pt able to rise from bed unassisted. Stood briefly before performing stand pivot to recliner towards L side-unsteady with uncontrolled descent to recliner seat. O2 90% on RA, dyspnea 2/4. Placed 3L  O2 back on pt.    Ambulation/Gait               General Gait Details: nonambulatory  Stairs            Wheelchair Mobility     Tilt Bed    Modified Rankin (Stroke Patients Only)       Balance Overall balance assessment: Needs assistance         Standing balance support: During functional activity Standing balance-Leahy Scale: Fair                               Pertinent Vitals/Pain Pain Assessment Pain Assessment: No/denies pain    Home Living Family/patient expects to be discharged to:: Private residence Living Arrangements: Alone   Type of Home: Independent living facility Home Access: Elevator       Home Layout: One level Home Equipment: Grab bars - toilet;Grab bars - tub/shower      Prior Function Prior Level of Function : Independent/Modified Independent             Mobility Comments: stand pivot bed<>WC; nonambulatory       Extremity/Trunk Assessment   Upper Extremity Assessment Upper Extremity Assessment: Defer to OT evaluation    Lower Extremity Assessment Lower Extremity Assessment: Generalized weakness (chronic  L foot drop)    Cervical / Trunk Assessment Cervical / Trunk Assessment: Normal  Communication   Communication Communication: No apparent difficulties    Cognition Arousal: Alert Behavior During Therapy: WFL for tasks assessed/performed   PT - Cognitive impairments: No apparent impairments                         Following commands: Intact       Cueing Cueing Techniques: Verbal cues     General Comments      Exercises     Assessment/Plan    PT Assessment Patient needs continued PT services  PT Problem List Decreased strength;Decreased activity  tolerance;Decreased balance;Decreased mobility       PT Treatment Interventions Functional mobility training;Therapeutic activities;Therapeutic exercise;Patient/family education;Balance training    PT Goals (Current goals can be found in the Care Plan section)  Acute Rehab PT Goals Patient Stated Goal: return home PT Goal Formulation: With patient Time For Goal Achievement: 06/05/24 Potential to Achieve Goals: Good    Frequency Min 3X/week     Co-evaluation               AM-PAC PT 6 Clicks Mobility  Outcome Measure Help needed turning from your back to your side while in a flat bed without using bedrails?: None Help needed moving from lying on your back to sitting on the side of a flat bed without using bedrails?: None Help needed moving to and from a bed to a chair (including a wheelchair)?: A Little Help needed standing up from a chair using your arms (e.g., wheelchair or bedside chair)?: A Little Help needed to walk in hospital room?: Total Help needed climbing 3-5 steps with a railing? : Total 6 Click Score: 16    End of Session   Activity Tolerance: Patient tolerated treatment well;Patient limited by fatigue Patient left: in chair;with call bell/phone within reach;with chair alarm set   PT Visit Diagnosis: Muscle weakness (generalized) (M62.81);Unsteadiness on feet (R26.81)    Time: 1140-1200 PT Time Calculation (min) (ACUTE ONLY): 20 min   Charges:   PT Evaluation $PT Eval Low Complexity: 1 Low   PT General Charges $$ ACUTE PT VISIT: 1 Visit           Dannial SQUIBB, PT Acute Rehabilitation  Office: [phone]

## 2024-05-22 NOTE — Progress Notes (Signed)
 PROGRESS NOTE    Catherine Andrews  FMW:[id number] DOB: Apr 26, 1935 DOA: 05/19/2024 PCP: Fleeta Valeria Mayo, MD    Chief Complaint  Patient presents with   Shortness of Breath    Brief Narrative:  Patient is a 89 year old female history of insulin -dependent type 2 diabetes, hypertension, melanoma, metastatic lung cancer on chemotherapy, paroxysmal A-fib on aspirin  admitted to the hospital with acute hypoxic respiratory failure felt secondary to her COVID pneumonia and CAP.   Assessment & Plan:   Principal Problem:   Pneumonia due to COVID-19 virus Active Problems:   Depression   Gastroesophageal reflux disease   Hyperlipidemia   Stage 3b chronic kidney disease (HCC)   Essential hypertension   Atrial fibrillation (HCC)   Diabetic nephropathy associated with type 2 diabetes mellitus (HCC)  #1 acute hypoxic respiratory failure secondary to COVID-pneumonia/CAP - Patient noted to have been exposed to someone at church with cold-like symptoms/upper respiratory symptoms over the weekend and noted to have a cough, dyspnea on exertion and progressive fatigue for the past 3 to 4 days prior to admission. - SARS coronavirus 2 PCR positive. - Influenza A and B PCR negative. - Chest x-ray done with increased right basilar opacity concerning for pneumonia or atelectasis. - Patient noted on presentation to have had fevers, leukocytosis, dyspnea, cough, and as such patient placed on empiric antibiotics of doxycycline  and Rocephin  to also cover CAP. - Patient also noted to have met criteria for antiviral COVID therapy due to hypoxia, symptoms occurring <5 days and assessed patient placed on remdesivir  and oral Decadron . -Status post completion of 3 days IV remdesivir . -Patient with complaints of bad dreams and as such Decadron  decreased to 4 mg daily which we will continue. - Continue scheduled DuoNebs.  Continue PPI. - Continue 3% hypertonic nebulizers. -Continue flutter valve, incentive spirometry. -  Continue Mucinex  600 mg twice daily. -Resume home regimen Lasix . - Supportive care.  2.  Paroxysmal A-fib -Continue Toprol -XL for rate control. - Continue home regimen aspirin .  3.  Well-controlled insulin -dependent type 2 diabetes -Hemoglobin A1c of 6.1 on 01/13/2024. - CBG noted at 202 this morning. - Patient on steroids. - Continue Lantus  40 units daily, SSI.    4.  CKD stage IIIb - Stable.  5.  Hyperlipidemia - Continue to hold statin and likely resume on discharge.   6.  Hypertension - Continue Norvasc  10 mg daily, Toprol -XL 75 mg daily.   -Resume home regimen hydralazine  50 mg 3 times daily and Lasix  40 mg every other day.. - Hold ARB.  7.  Depression - Wellbutrin .  8.  GERD -PPI.   DVT prophylaxis: Heparin  Code Status: DNR Family Communication: Updated patient.  No family at bedside.     Disposition: Likely home in the next 24 to 48 hours if continued clinical improvement.    Status is: Inpatient Remains inpatient appropriate because: Severity of illness   Consultants:  None  Procedures:  Chest x-ray 05/19/2024   Antimicrobials:  Anti-infectives (From admission, onward)    Start     Dose/Rate Route Frequency Ordered Stop   05/20/24 1300  cefTRIAXone  (ROCEPHIN ) 1 g in sodium chloride  0.9 % 100 mL IVPB  Status:  Discontinued        1 g 200 mL/hr over 30 Minutes Intravenous Every 24 hours 05/19/24 1257 05/20/24 0920   05/20/24 1300  cefTRIAXone  (ROCEPHIN ) 2 g in sodium chloride  0.9 % 100 mL IVPB        2 g 200 mL/hr over 30 Minutes Intravenous  Every 24 hours 05/20/24 0920     05/20/24 1000  remdesivir  100 mg in sodium chloride  0.9 % 100 mL IVPB       Placed in Followed by Linked Group   100 mg 200 mL/hr over 30 Minutes Intravenous Daily 05/19/24 1256 05/21/24 1916   05/19/24 2200  doxycycline  (VIBRA -TABS) tablet 100 mg        100 mg Oral Every 12 hours 05/19/24 1257     05/19/24 1330  remdesivir  200 mg in sodium chloride  0.9% 250 mL IVPB       Placed  in Followed by Linked Group   200 mg 580 mL/hr over 30 Minutes Intravenous Once 05/19/24 1256 05/19/24 1543   05/19/24 1230  cefTRIAXone  (ROCEPHIN ) 1 g in sodium chloride  0.9 % 100 mL IVPB        1 g 200 mL/hr over 30 Minutes Intravenous  Once 05/19/24 1222 05/19/24 1508   05/19/24 1230  doxycycline  (VIBRA -TABS) tablet 100 mg        100 mg Oral  Once 05/19/24 1222 05/19/24 1414         Subjective: Patient sitting up in chair.  States she is feeling much better today.  States some improvement with dreams.  Denies any chest pain.  Still with some shortness of breath but improving.  Stated her oxygen was checked and it remained above 90% while working with physical therapy.  Objective: Vitals:   05/21/24 2036 05/22/24 0516 05/22/24 0921 05/22/24 1449  BP:  (!) 179/56  (!) 173/54  Pulse:  (!) 57  61  Resp:  14  16  Temp:  98 F (36.7 C)  98.1 F (36.7 C)  TempSrc:  Oral  Oral  SpO2: 98% 97% 97% 97%  Weight:      Height:        Intake/Output Summary (Last 24 hours) at 05/22/2024 1701 Last data filed at 05/22/2024 1100 Gross per 24 hour  Intake 340 ml  Output 1400 ml  Net -1060 ml   Filed Weights   05/19/24 1032  Weight: 74.8 kg    Examination:  General exam: NAD. Respiratory system: Decreased scattered coarse breath sounds.  No wheezing.  No significant crackles noted.  Speaking in full sentences.  On 3 L nasal cannula with sats of 97%.   Cardiovascular system: Regular rate rhythm no murmurs rubs or gallops.  No JVD.  No lower extremity edema.  Gastrointestinal system: Abdomen soft, nontender, nondistended, positive bowel sounds.  No rebound.  No guarding.  Central nervous system: Alert and oriented. No focal neurological deficits. Extremities: Symmetric 5 x 5 power. Skin: No rashes, lesions or ulcers Psychiatry: Judgement and insight appear normal. Mood & affect appropriate.     Data Reviewed: I have personally reviewed following labs and imaging  studies  CBC: Recent Labs  Lab 05/19/24 1126 05/20/24 0426 05/21/24 0336 05/22/24 0329  WBC 15.9* 13.8* 11.7* 11.9*  NEUTROABS  --   --  10.3*  --   HGB 12.2 11.5* 12.4 12.1  HCT 38.8 37.6 41.6 38.4  MCV 106.0* 106.5* 107.2* 104.6*  PLT 151 133* 157 188    Basic Metabolic Panel: Recent Labs  Lab 05/19/24 1125 05/20/24 0426 05/21/24 0336 05/22/24 0329  NA 131* 140 139 138  K 4.9 4.2 4.8 4.5  CL 92* 102 102 103  CO2 22 23 22  21*  GLUCOSE 363* 166* 216* 246*  BUN 36* 42* 45* 50*  CREATININE 1.69* 1.56* 1.33* 1.43*  CALCIUM  9.0 9.2  9.8 9.8    GFR: Estimated Creatinine Clearance: 27.4 mL/min (A) (by C-G formula based on SCr of 1.43 mg/dL (H)).  Liver Function Tests: Recent Labs  Lab 05/19/24 1125  AST 23  ALT 15  ALKPHOS 90  BILITOT 0.9  PROT 7.0  ALBUMIN 3.8    CBG: Recent Labs  Lab 05/21/24 1158 05/21/24 1606 05/21/24 2119 05/22/24 0806 05/22/24 1123  GLUCAP 231* 190* 374* 202* 185*     Recent Results (from the past 240 hours)  Resp panel by RT-PCR (RSV, Flu A&B, Covid) Anterior Nasal Swab     Status: Abnormal   Collection Time: 05/19/24 11:25 AM   Specimen: Anterior Nasal Swab  Result Value Ref Range Status   SARS Coronavirus 2 by RT PCR POSITIVE (A) NEGATIVE Final    Comment: (NOTE) SARS-CoV-2 target nucleic acids are DETECTED.  The SARS-CoV-2 RNA is generally detectable in upper respiratory specimens during the acute phase of infection. Positive results are indicative of the presence of the identified virus, but do not rule out bacterial infection or co-infection with other pathogens not detected by the test. Clinical correlation with patient history and other diagnostic information is necessary to determine patient infection status. The expected result is Negative.  Fact Sheet for Patients: BloggerCourse.com  Fact Sheet for Healthcare Providers: SeriousBroker.it  This test is not yet  approved or cleared by the [place]  FDA and  has been authorized for detection and/or diagnosis of SARS-CoV-2 by FDA under an Emergency Use Authorization (EUA).  This EUA will remain in effect (meaning this test can be used) for the duration of  the COVID-19 declaration under Section 564(b)(1) of the A ct, 21 U.S.C. section 360bbb-3(b)(1), unless the authorization is terminated or revoked sooner.     Influenza A by PCR NEGATIVE NEGATIVE Final   Influenza B by PCR NEGATIVE NEGATIVE Final    Comment: (NOTE) The Xpert Xpress SARS-CoV-2/FLU/RSV plus assay is intended as an aid in the diagnosis of influenza from Nasopharyngeal swab specimens and should not be used as a sole basis for treatment. Nasal washings and aspirates are unacceptable for Xpert Xpress SARS-CoV-2/FLU/RSV testing.  Fact Sheet for Patients: BloggerCourse.com  Fact Sheet for Healthcare Providers: SeriousBroker.it  This test is not yet approved or cleared by the [place]  FDA and has been authorized for detection and/or diagnosis of SARS-CoV-2 by FDA under an Emergency Use Authorization (EUA). This EUA will remain in effect (meaning this test can be used) for the duration of the COVID-19 declaration under Section 564(b)(1) of the Act, 21 U.S.C. section 360bbb-3(b)(1), unless the authorization is terminated or revoked.     Resp Syncytial Virus by PCR NEGATIVE NEGATIVE Final    Comment: (NOTE) Fact Sheet for Patients: BloggerCourse.com  Fact Sheet for Healthcare Providers: SeriousBroker.it  This test is not yet approved or cleared by the [place]  FDA and has been authorized for detection and/or diagnosis of SARS-CoV-2 by FDA under an Emergency Use Authorization (EUA). This EUA will remain in effect (meaning this test can be used) for the duration of the COVID-19 declaration under Section 564(b)(1) of  the Act, 21 U.S.C. section 360bbb-3(b)(1), unless the authorization is terminated or revoked.  Performed at [facility], [address], [place], [place] [address]          Radiology Studies: No results found.       Scheduled Meds:  amLODipine   10 mg Oral Daily   aspirin   81 mg Oral QPM   buPROPion   150 mg Oral Daily   dexamethasone   4 mg Oral Daily   doxycycline   100 mg Oral Q12H   fluticasone   1 spray Each Nare Daily   guaiFENesin   600 mg Oral BID   heparin   5,000 Units Subcutaneous Q8H   hydrALAZINE   50 mg Oral Q8H   insulin  aspart  0-15 Units Subcutaneous TID WC   insulin  aspart  0-5 Units Subcutaneous QHS   insulin  glargine  40 Units Subcutaneous QHS   ipratropium-albuterol   3 mL Nebulization TID   metoprolol  succinate  75 mg Oral Daily   pantoprazole   40 mg Oral Daily   sodium chloride  HYPERTONIC  4 mL Nebulization Daily   venlafaxine  XR  75 mg Oral Q breakfast   Continuous Infusions:  cefTRIAXone  (ROCEPHIN )  IV 2 g (05/22/24 1209)     LOS: 3 days    Time spent: 40 minutes    Toribio Hummer, MD [facility]   To contact the attending provider between 7A-7P or the covering provider during after hours 7P-7A, please log into the web site [email or web address] and access using universal [place] password for that web site. If you do not have the password, please call the hospital operator.  05/22/2024, 5:01 PM

## 2024-05-23 DIAGNOSIS — U071 COVID-19: Secondary | ICD-10-CM | POA: Diagnosis not present

## 2024-05-23 DIAGNOSIS — E785 Hyperlipidemia, unspecified: Secondary | ICD-10-CM | POA: Diagnosis not present

## 2024-05-23 DIAGNOSIS — E1121 Type 2 diabetes mellitus with diabetic nephropathy: Secondary | ICD-10-CM | POA: Diagnosis not present

## 2024-05-23 DIAGNOSIS — I4891 Unspecified atrial fibrillation: Secondary | ICD-10-CM | POA: Diagnosis not present

## 2024-05-23 LAB — BASIC METABOLIC PANEL WITH GFR
Anion gap: 19 — ABNORMAL HIGH (ref 5–15)
BUN: 59 mg/dL — ABNORMAL HIGH (ref 8–23)
CO2: 17 mmol/L — ABNORMAL LOW (ref 22–32)
Calcium: 9.6 mg/dL (ref 8.9–10.3)
Chloride: 99 mmol/L (ref 98–111)
Creatinine, Ser: 1.54 mg/dL — ABNORMAL HIGH (ref 0.44–1.00)
GFR, Estimated: 32 mL/min — ABNORMAL LOW (ref >60)
Glucose, Bld: 298 mg/dL — ABNORMAL HIGH (ref 70–99)
Potassium: 4.8 mmol/L (ref 3.5–5.1)
Sodium: 134 mmol/L — ABNORMAL LOW (ref 135–145)

## 2024-05-23 LAB — CBC
HCT: 40.1 % (ref 36.0–46.0)
Hemoglobin: 12 g/dL (ref 12.0–15.0)
MCH: 31.7 pg (ref 26.0–34.0)
MCHC: 29.9 g/dL — ABNORMAL LOW (ref 30.0–36.0)
MCV: 106.1 fL — ABNORMAL HIGH (ref 80.0–100.0)
Platelets: 192 K/uL (ref 150–400)
RBC: 3.78 MIL/uL — ABNORMAL LOW (ref 3.87–5.11)
RDW: 14.5 % (ref 11.5–15.5)
WBC: 11.6 K/uL — ABNORMAL HIGH (ref 4.0–10.5)
nRBC: 0 % (ref 0.0–0.2)

## 2024-05-23 LAB — GLUCOSE, CAPILLARY
Glucose-Capillary: 198 mg/dL — ABNORMAL HIGH (ref 70–99)
Glucose-Capillary: 224 mg/dL — ABNORMAL HIGH (ref 70–99)
Glucose-Capillary: 161 mg/dL — ABNORMAL HIGH (ref 70–99)
Glucose-Capillary: 324 mg/dL — ABNORMAL HIGH (ref 70–99)
Glucose-Capillary: 335 mg/dL — ABNORMAL HIGH (ref 70–99)

## 2024-05-23 LAB — MAGNESIUM: Magnesium: 2.5 mg/dL — ABNORMAL HIGH (ref 1.7–2.4)

## 2024-05-23 MED ORDER — INSULIN ASPART 100 UNIT/ML IJ SOLN
4.0000 [IU] | Freq: Three times a day (TID) | INTRAMUSCULAR | Status: DC
  Administered 2024-05-23 – 2024-05-25 (×6): 4 [IU] via SUBCUTANEOUS

## 2024-05-23 MED ORDER — AMOXICILLIN-POT CLAVULANATE 500-125 MG PO TABS
1.0000 | ORAL_TABLET | Freq: Two times a day (BID) | ORAL | Status: DC
  Administered 2024-05-24 – 2024-05-25 (×3): 1 via ORAL
  Filled 2024-05-23 (×4): qty 1

## 2024-05-23 MED ORDER — SODIUM BICARBONATE 650 MG PO TABS
650.0000 mg | ORAL_TABLET | Freq: Two times a day (BID) | ORAL | Status: DC
Start: 2024-05-23 — End: 2024-05-26
  Administered 2024-05-23 – 2024-05-25 (×5): 650 mg via ORAL
  Filled 2024-05-23 (×5): qty 1

## 2024-05-23 NOTE — Plan of Care (Signed)

## 2024-05-23 NOTE — Progress Notes (Signed)
 PROGRESS NOTE    Catherine Andrews  FMW:[id number] DOB: 10/08/1934 DOA: 05/19/2024 PCP: Fleeta Valeria Mayo, MD    Chief Complaint  Patient presents with   Shortness of Breath    Brief Narrative:  Patient is a 89 year old female history of insulin -dependent type 2 diabetes, hypertension, melanoma, metastatic lung cancer on chemotherapy, paroxysmal A-fib on aspirin  admitted to the hospital with acute hypoxic respiratory failure felt secondary to her COVID pneumonia and CAP.   Assessment & Plan:   Principal Problem:   Pneumonia due to COVID-19 virus Active Problems:   Depression   Gastroesophageal reflux disease   Hyperlipidemia   Stage 3b chronic kidney disease (HCC)   Essential hypertension   Atrial fibrillation (HCC)   Diabetic nephropathy associated with type 2 diabetes mellitus (HCC)  #1 acute hypoxic respiratory failure secondary to COVID-pneumonia/CAP - Patient noted to have been exposed to someone at church with cold-like symptoms/upper respiratory symptoms over the weekend and noted to have a cough, dyspnea on exertion and progressive fatigue for the past 3 to 4 days prior to admission. - SARS coronavirus 2 PCR positive. - Influenza A and B PCR negative. - Chest x-ray done with increased right basilar opacity concerning for pneumonia or atelectasis. - Patient noted on presentation to have had fevers, leukocytosis, dyspnea, cough, and as such patient placed on empiric antibiotics of doxycycline  and Rocephin  to also cover CAP. - Patient also noted to have met criteria for antiviral COVID therapy due to hypoxia, symptoms occurring <5 days and assessed patient placed on remdesivir  and oral Decadron . -Status post completion of 3 days IV remdesivir . -Patient with complaints of bad dreams and as such Decadron  decreased to 4 mg daily. - Continue scheduled DuoNebs, PPI.  - Continue 3% hypertonic nebulizers. -Continue flutter valve, incentive spirometry. - Continue Mucinex  600 mg twice  daily. - Continue home regimen Lasix . -Patient on Rocephin  and doxycycline  and will transition to Augmentin  for 2 more days to complete a 7-day course of antibiotic treatment. - Supportive care.  2.  Paroxysmal A-fib -Continue Toprol -XL for rate control.   - Continue aspirin .   3.  Well-controlled insulin -dependent type 2 diabetes -Hemoglobin A1c of 6.1 on 01/13/2024. - CBG noted at 198 this morning. - Patient on steroids. - Continue home dose Lantus  40 units daily, SSI.    4.  CKD stage IIIb - Stable.  5.  Hyperlipidemia - Continue to hold statin and could likely resume on discharge.   6.  Hypertension - Continue Norvasc  10 mg daily, Toprol -XL 75 mg daily, hydralazine  50 mg 3 times daily, Lasix  40 mg every other day. - Continue to hold ARB.  7.  Depression - Continue Wellbutrin .  8.  GERD - Continue PPI.   DVT prophylaxis: Heparin  Code Status: DNR Family Communication: Updated patient.  No family at bedside.     Disposition: SNF hopefully in the next 24 to 48 hours when bed available.   Status is: Inpatient Remains inpatient appropriate because: Severity of illness   Consultants:  None  Procedures:  Chest x-ray 05/19/2024   Antimicrobials:  Anti-infectives (From admission, onward)    Start     Dose/Rate Route Frequency Ordered Stop   05/24/24 1000  amoxicillin -clavulanate (AUGMENTIN ) 500-125 MG per tablet 1 tablet        1 tablet Oral 2 times daily 05/23/24 1052 05/26/24 0959   05/20/24 1300  cefTRIAXone  (ROCEPHIN ) 1 g in sodium chloride  0.9 % 100 mL IVPB  Status:  Discontinued  1 g 200 mL/hr over 30 Minutes Intravenous Every 24 hours 05/19/24 1257 05/20/24 0920   05/20/24 1300  cefTRIAXone  (ROCEPHIN ) 2 g in sodium chloride  0.9 % 100 mL IVPB        2 g 200 mL/hr over 30 Minutes Intravenous Every 24 hours 05/20/24 0920 05/23/24 2359   05/20/24 1000  remdesivir  100 mg in sodium chloride  0.9 % 100 mL IVPB       Placed in Followed by Linked Group   100  mg 200 mL/hr over 30 Minutes Intravenous Daily 05/19/24 1256 05/21/24 1916   05/19/24 2200  doxycycline  (VIBRA -TABS) tablet 100 mg        100 mg Oral Every 12 hours 05/19/24 1257 05/23/24 2359   05/19/24 1330  remdesivir  200 mg in sodium chloride  0.9% 250 mL IVPB       Placed in Followed by Linked Group   200 mg 580 mL/hr over 30 Minutes Intravenous Once 05/19/24 1256 05/19/24 1543   05/19/24 1230  cefTRIAXone  (ROCEPHIN ) 1 g in sodium chloride  0.9 % 100 mL IVPB        1 g 200 mL/hr over 30 Minutes Intravenous  Once 05/19/24 1222 05/19/24 1508   05/19/24 1230  doxycycline  (VIBRA -TABS) tablet 100 mg        100 mg Oral  Once 05/19/24 1222 05/19/24 1414         Subjective: Patient sitting up in chair.  States she felt much better yesterday.  Complains of diffuse generalized weakness.  Some improvement with shortness of breath.  Denies any chest pain.  Cough improving.  Denies any further vivid dreams.  Objective: Vitals:   05/22/24 1449 05/22/24 2151 05/23/24 0639 05/23/24 0807  BP: (!) 173/54 (!) 155/46 (!) 159/67   Pulse: 61 62 60   Resp: 16 16 16    Temp: 98.1 F (36.7 C) 97.6 F (36.4 C) 98.3 F (36.8 C)   TempSrc: Oral Oral Oral   SpO2: 97% 97% 98% 98%  Weight:      Height:        Intake/Output Summary (Last 24 hours) at 05/23/2024 1255 Last data filed at 05/23/2024 1000 Gross per 24 hour  Intake 400 ml  Output 1600 ml  Net -1200 ml   Filed Weights   05/19/24 1032  Weight: 74.8 kg    Examination:  General exam: NAD. Respiratory system: Decreased scattered coarse breath sounds.  No wheezing.  No significant crackles.  Speaking in full sentences.  On 3 L nasal cannula with sats of 98%.  Cardiovascular system: RRR no murmurs rubs or gallops.  No JVD.  No pitting lower extremity edema.  Gastrointestinal system: Abdomen is soft, nontender, nondistended, positive bowel sounds.  No rebound.  No guarding.  Central nervous system: Alert and oriented.  Moving extremities  spontaneously.  No focal neurological deficits.   Extremities: Symmetric 5 x 5 power. Skin: No rashes, lesions or ulcers Psychiatry: Judgement and insight appear normal. Mood & affect appropriate.     Data Reviewed: I have personally reviewed following labs and imaging studies  CBC: Recent Labs  Lab 05/19/24 1126 05/20/24 0426 05/21/24 0336 05/22/24 0329 05/23/24 0405  WBC 15.9* 13.8* 11.7* 11.9* 11.6*  NEUTROABS  --   --  10.3*  --   --   HGB 12.2 11.5* 12.4 12.1 12.0  HCT 38.8 37.6 41.6 38.4 40.1  MCV 106.0* 106.5* 107.2* 104.6* 106.1*  PLT 151 133* 157 188 192    Basic Metabolic Panel: Recent Labs  Lab 05/19/24 1125 05/20/24 0426 05/21/24 0336 05/22/24 0329 05/23/24 0405  NA 131* 140 139 138 134*  K 4.9 4.2 4.8 4.5 4.8  CL 92* 102 102 103 99  CO2 22 23 22  21* 17*  GLUCOSE 363* 166* 216* 246* 298*  BUN 36* 42* 45* 50* 59*  CREATININE 1.69* 1.56* 1.33* 1.43* 1.54*  CALCIUM  9.0 9.2 9.8 9.8 9.6  MG  --   --   --   --  2.5*    GFR: Estimated Creatinine Clearance: 25.5 mL/min (A) (by C-G formula based on SCr of 1.54 mg/dL (H)).  Liver Function Tests: Recent Labs  Lab 05/19/24 1125  AST 23  ALT 15  ALKPHOS 90  BILITOT 0.9  PROT 7.0  ALBUMIN 3.8    CBG: Recent Labs  Lab 05/22/24 1123 05/22/24 1701 05/22/24 2149 05/23/24 0753 05/23/24 1135  GLUCAP 185* 275* 320* 198* 224*     Recent Results (from the past 240 hours)  Resp panel by RT-PCR (RSV, Flu A&B, Covid) Anterior Nasal Swab     Status: Abnormal   Collection Time: 05/19/24 11:25 AM   Specimen: Anterior Nasal Swab  Result Value Ref Range Status   SARS Coronavirus 2 by RT PCR POSITIVE (A) NEGATIVE Final    Comment: (NOTE) SARS-CoV-2 target nucleic acids are DETECTED.  The SARS-CoV-2 RNA is generally detectable in upper respiratory specimens during the acute phase of infection. Positive results are indicative of the presence of the identified virus, but do not rule out bacterial infection  or co-infection with other pathogens not detected by the test. Clinical correlation with patient history and other diagnostic information is necessary to determine patient infection status. The expected result is Negative.  Fact Sheet for Patients: BloggerCourse.com  Fact Sheet for Healthcare Providers: SeriousBroker.it  This test is not yet approved or cleared by the [place]  FDA and  has been authorized for detection and/or diagnosis of SARS-CoV-2 by FDA under an Emergency Use Authorization (EUA).  This EUA will remain in effect (meaning this test can be used) for the duration of  the COVID-19 declaration under Section 564(b)(1) of the A ct, 21 U.S.C. section 360bbb-3(b)(1), unless the authorization is terminated or revoked sooner.     Influenza A by PCR NEGATIVE NEGATIVE Final   Influenza B by PCR NEGATIVE NEGATIVE Final    Comment: (NOTE) The Xpert Xpress SARS-CoV-2/FLU/RSV plus assay is intended as an aid in the diagnosis of influenza from Nasopharyngeal swab specimens and should not be used as a sole basis for treatment. Nasal washings and aspirates are unacceptable for Xpert Xpress SARS-CoV-2/FLU/RSV testing.  Fact Sheet for Patients: BloggerCourse.com  Fact Sheet for Healthcare Providers: SeriousBroker.it  This test is not yet approved or cleared by the [place]  FDA and has been authorized for detection and/or diagnosis of SARS-CoV-2 by FDA under an Emergency Use Authorization (EUA). This EUA will remain in effect (meaning this test can be used) for the duration of the COVID-19 declaration under Section 564(b)(1) of the Act, 21 U.S.C. section 360bbb-3(b)(1), unless the authorization is terminated or revoked.     Resp Syncytial Virus by PCR NEGATIVE NEGATIVE Final    Comment: (NOTE) Fact Sheet for Patients: BloggerCourse.com  Fact  Sheet for Healthcare Providers: SeriousBroker.it  This test is not yet approved or cleared by the [place]  FDA and has been authorized for detection and/or diagnosis of SARS-CoV-2 by FDA under an Emergency Use Authorization (EUA). This EUA will remain in effect (meaning this test can be  used) for the duration of the COVID-19 declaration under Section 564(b)(1) of the Act, 21 U.S.C. section 360bbb-3(b)(1), unless the authorization is terminated or revoked.  Performed at [facility], [address], [place], [place] [address]          Radiology Studies: No results found.       Scheduled Meds:  amLODipine   10 mg Oral Daily   [START ON 05/24/2024] amoxicillin -clavulanate  1 tablet Oral BID   aspirin   81 mg Oral QPM   buPROPion   150 mg Oral Daily   dexamethasone   4 mg Oral Daily   doxycycline   100 mg Oral Q12H   fluticasone   1 spray Each Nare Daily   furosemide   40 mg Oral QODAY   guaiFENesin   600 mg Oral BID   heparin   5,000 Units Subcutaneous Q8H   hydrALAZINE   50 mg Oral Q8H   insulin  aspart  0-15 Units Subcutaneous TID WC   insulin  aspart  0-5 Units Subcutaneous QHS   insulin  aspart  4 Units Subcutaneous TID WC   insulin  glargine  40 Units Subcutaneous QHS   ipratropium-albuterol   3 mL Nebulization TID   metoprolol  succinate  75 mg Oral Daily   pantoprazole   40 mg Oral Daily   sodium bicarbonate   650 mg Oral BID   venlafaxine  XR  75 mg Oral Q breakfast   Continuous Infusions:  cefTRIAXone  (ROCEPHIN )  IV 2 g (05/22/24 1209)     LOS: 4 days    Time spent: 40 minutes    Toribio Hummer, MD [facility]   To contact the attending provider between 7A-7P or the covering provider during after hours 7P-7A, please log into the web site [email or web address] and access using universal [place] password for that web site. If you do not have the password, please call the hospital operator.  05/23/2024, 12:55 PM

## 2024-05-23 NOTE — Progress Notes (Signed)
 Physical Therapy Treatment Patient Details Name: Catherine Andrews MRN: [id number] DOB: 07/01/1935 Today's Date: 05/23/2024   History of Present Illness 89 yo female admitted with COVID Pna. Hx of DM, melanoma, met lung Ca, Afib, CHF, vertigo, MI, anemia, syncope, CABG, L foot drop, CKD    PT Comments  Pt agreeable to working with therapy. Requested assistance transferring back to bed. O2 97% on 2L during session. Min A for safe mobility on today. Pt reports feeling weaker today compared to yesterday. She doesn't feel like she could manage at home alone in her current condition. Discussed d/c plan-pt okay with therapy team updating recommendation. Patient will benefit from continued inpatient follow up therapy, <3 hours/day.     If plan is discharge home, recommend the following: A little help with walking and/or transfers;A little help with bathing/dressing/bathroom;Assistance with cooking/housework;Assist for transportation;Help with stairs or ramp for entrance   Can travel by private vehicle     Yes  Equipment Recommendations  None recommended by PT    Recommendations for Other Services       Precautions / Restrictions Precautions Precautions: Fall Precaution/Restrictions Comments: monitor O2 Restrictions Weight Bearing Restrictions Per Provider Order: No     Mobility  Bed Mobility Overal bed mobility: Needs Assistance Bed Mobility: Sit to Supine       Sit to supine: Modified independent (Device/Increase time)        Transfers Overall transfer level: Needs assistance Equipment used: 1 person hand held assist Transfers: Sit to/from Stand Sit to Stand: Min assist Stand pivot transfers: Min assist         General transfer comment: Recliner to bed, 1 HHA and steadying required on today. Remained on [place] O2 97% on 2L.    Ambulation/Gait                   Stairs             Wheelchair Mobility     Tilt Bed    Modified Rankin (Stroke Patients  Only)       Balance Overall balance assessment: Needs assistance         Standing balance support: During functional activity Standing balance-Leahy Scale: Poor                              Communication Communication Communication: Impaired  Cognition Arousal: Alert Behavior During Therapy: WFL for tasks assessed/performed   PT - Cognitive impairments: No apparent impairments                         Following commands: Intact      Cueing Cueing Techniques: Verbal cues  Exercises      General Comments        Pertinent Vitals/Pain Pain Assessment Pain Assessment: No/denies pain    Home Living                          Prior Function            PT Goals (current goals can now be found in the care plan section) Progress towards PT goals: Progressing toward goals    Frequency    Min 3X/week      PT Plan      Co-evaluation              AM-PAC PT 6 Clicks Mobility   Outcome Measure  Help needed turning from your back to your side while in a flat bed without using bedrails?: None Help needed moving from lying on your back to sitting on the side of a flat bed without using bedrails?: None Help needed moving to and from a bed to a chair (including a wheelchair)?: A Little Help needed standing up from a chair using your arms (e.g., wheelchair or bedside chair)?: A Little Help needed to walk in hospital room?: Total Help needed climbing 3-5 steps with a railing? : Total 6 Click Score: 16    End of Session Equipment Utilized During Treatment: Gait belt;Oxygen Activity Tolerance: Patient limited by fatigue;Patient tolerated treatment well Patient left: in bed;with call bell/phone within reach;with bed alarm set   PT Visit Diagnosis: Muscle weakness (generalized) (M62.81)     Time: 1250-1306 PT Time Calculation (min) (ACUTE ONLY): 16 min  Charges:    $Therapeutic Activity: 8-22 mins PT General Charges $$  ACUTE PT VISIT: 1 Visit                        Dannial SQUIBB, PT Acute Rehabilitation  Office: [phone]

## 2024-05-23 NOTE — Progress Notes (Signed)
 Mobility Specialist - Progress Note   05/23/24 1206  Mobility  Activity Pivoted/transferred from bed to chair  Level of Assistance Minimal assist, patient does 75% or more  Assistive Device Front wheel walker  Range of Motion/Exercises Active  Activity Response Tolerated well  Mobility Referral Yes  Mobility visit 1 Mobility  Mobility Specialist Start Time (ACUTE ONLY) 1150  Mobility Specialist Stop Time (ACUTE ONLY) 1206  Mobility Specialist Time Calculation (min) (ACUTE ONLY) 16 min   Pt was found in bed and agreeable to mobilize. Pt concerned about dried up blood on chuck pads. No active bleeding noted. RN notified pt pads with dried up blood. At EOS was left on recliner chair with all needs met. Call bell in reach.   Erminio Leos,  Mobility Specialist Can be reached via Secure Chat

## 2024-05-23 NOTE — Progress Notes (Signed)
 Occupational Therapy Treatment Patient Details Name: ARRIA NAIM MRN: [id number] DOB: [birth year] Today's Date: 05/23/2024   History of present illness 89 yo female admitted with COVID Pna. Hx of DM, melanoma, met lung Ca, Afib, CHF, vertigo, MI, anemia, syncope, CABG, L foot drop, CKD   OT comments  Patient seen for treatment session this am. Patient already up in recliner and reporting worsening fatigue from yesterday's evaluation performance. OT initiated energy conservation principles with breathing strategies for light BADL's and handout introduced for carryover. Secure chat to medical team re: patients remarks about current fatigue level. OT now recommending that patient will benefit from continued inpatient follow up therapy, <3 hours/day upon discharge from Acute hospital setting. Patient requires continued Acute care hospital level OT services to progress safety and functional performance and allow for discharge.        If plan is discharge home, recommend the following:  Two people to help with walking and/or transfers;A little help with bathing/dressing/bathroom;Assistance with cooking/housework;Assist for transportation;Help with stairs or ramp for entrance   Equipment Recommendations  None recommended by OT       Precautions / Restrictions Precautions Precautions: Fall Precaution/Restrictions Comments: monitor O2 Restrictions Weight Bearing Restrictions Per Provider Order: No       Mobility Bed Mobility Overal bed mobility:  (patient up in recliner and remained)                  Transfers  Patient declined offer to assist to [facility]                       Balance Overall balance assessment: Needs assistance Sitting-balance support: Feet supported, No upper extremity supported Sitting balance-Leahy Scale: Fair Sitting balance - Comments: generalized weakenss in sitting                                   ADL either performed or assessed  with clinical judgement   ADL Overall ADL's : Needs assistance/impaired     Grooming: Wash/dry hands;Wash/dry face;Oral care;Brushing hair;Sitting   Upper Body Bathing: Minimal assistance;Sitting Upper Body Bathing Details (indicate cue type and reason): increased fatigue this session with BADL's in sitting up in recliner, cues for pacing and breathing strategies, educated in simple 5 P's of ECT with handout initiated, removed O2 briefly with sats from 100% on 3 ltrs to 90% on RA, replaced and sats returned to 98%                           General ADL Comments: see above    Extremity/Trunk Assessment Upper Extremity Assessment Upper Extremity Assessment: Generalized weakness   Lower Extremity Assessment Lower Extremity Assessment: Defer to PT evaluation        Vision   Vision Assessment?: Wears glasses for reading Additional Comments: no changes from baseline macular degeneration   Perception Perception Perception: Impaired Preception Impairment Details: Figure ground   Praxis     Communication Communication Communication: Impaired Factors Affecting Communication: Hearing impaired   Cognition Arousal: Alert Behavior During Therapy: WFL for tasks assessed/performed Cognition: No apparent impairments                               Following commands: Intact        Cueing   Cueing Techniques: Verbal cues  General Comments see above for trial on RA, replaced O2 to 3 ltrs, secure chat to team/MD for change in dispo rec as per patient reporting she is more fatigued this day and would not be able to manage in her ILF apt if discharged at this level    Pertinent Vitals/ Pain       Pain Assessment Pain Assessment: No/denies pain   Frequency  Min 2X/week        Progress Toward Goals  OT Goals(current goals can now be found in the care plan section)  Progress towards OT goals: Progressing toward goals  Acute Rehab OT  Goals Patient Stated Goal: to get stronger OT Goal Formulation: With patient Time For Goal Achievement: 06/05/24 Potential to Achieve Goals: Good ADL Goals Pt Will Perform Lower Body Bathing: with set-up;sitting/lateral leans Pt Will Perform Lower Body Dressing: with supervision;sitting/lateral leans Pt Will Transfer to Toilet: with set-up;grab bars Pt Will Perform Toileting - Clothing Manipulation and hygiene: with set-up;sitting/lateral leans Pt Will Perform Tub/Shower Transfer: Shower transfer;with supervision;Stand pivot transfer;grab bars;shower seat Pt/caregiver will Perform Home Exercise Program: Both right and left upper extremity;Independently;With written HEP provided Additional ADL Goal #1: Patient will teach back 4/5 ECT's for BADL's and mobility with min cues  Plan         AM-PAC OT 6 Clicks Daily Activity     Outcome Measure   Help from another person eating meals?: None Help from another person taking care of personal grooming?: A Little Help from another person toileting, which includes using toliet, bedpan, or urinal?: A Little Help from another person bathing (including washing, rinsing, drying)?: A Little Help from another person to put on and taking off regular upper body clothing?: A Little Help from another person to put on and taking off regular lower body clothing?: A Little 6 Click Score: 19    End of Session Equipment Utilized During Treatment: Oxygen  OT Visit Diagnosis: Unsteadiness on feet (R26.81);Other abnormalities of gait and mobility (R26.89);Muscle weakness (generalized) (M62.81);Low vision, both eyes (H54.2)   Activity Tolerance Patient limited by fatigue   Patient Left in chair;with call bell/phone within reach;with chair alarm set   Nurse Communication Mobility status        Time: 8794-8774 OT Time Calculation (min): 20 min  Charges: OT General Charges $OT Visit: 1 Visit OT Treatments $Self Care/Home Management : 8-22  mins  Salia Cangemi OT/L Acute Rehabilitation Department  [phone]  05/23/2024, 1:33 PM

## 2024-05-24 DIAGNOSIS — J1282 Pneumonia due to coronavirus disease 2019: Secondary | ICD-10-CM | POA: Diagnosis not present

## 2024-05-24 DIAGNOSIS — U071 COVID-19: Secondary | ICD-10-CM | POA: Diagnosis not present

## 2024-05-24 LAB — BASIC METABOLIC PANEL WITH GFR
Anion gap: 15 (ref 5–15)
BUN: 72 mg/dL — ABNORMAL HIGH (ref 8–23)
CO2: 23 mmol/L (ref 22–32)
Calcium: 9.4 mg/dL (ref 8.9–10.3)
Chloride: 102 mmol/L (ref 98–111)
Creatinine, Ser: 1.79 mg/dL — ABNORMAL HIGH (ref 0.44–1.00)
GFR, Estimated: 27 mL/min — ABNORMAL LOW (ref >60)
Glucose, Bld: 163 mg/dL — ABNORMAL HIGH (ref 70–99)
Potassium: 4.1 mmol/L (ref 3.5–5.1)
Sodium: 141 mmol/L (ref 135–145)

## 2024-05-24 LAB — CBC WITH DIFFERENTIAL/PLATELET
Abs Immature Granulocytes: 0.65 K/uL — ABNORMAL HIGH (ref 0.00–0.07)
Basophils Absolute: 0.1 K/uL (ref 0.0–0.1)
Basophils Relative: 1 %
Eosinophils Absolute: 0 K/uL (ref 0.0–0.5)
Eosinophils Relative: 0 %
HCT: 38.2 % (ref 36.0–46.0)
Hemoglobin: 11.9 g/dL — ABNORMAL LOW (ref 12.0–15.0)
Immature Granulocytes: 6 %
Lymphocytes Relative: 11 %
Lymphs Abs: 1.2 K/uL (ref 0.7–4.0)
MCH: 32.1 pg (ref 26.0–34.0)
MCHC: 31.2 g/dL (ref 30.0–36.0)
MCV: 103 fL — ABNORMAL HIGH (ref 80.0–100.0)
Monocytes Absolute: 1.3 K/uL — ABNORMAL HIGH (ref 0.1–1.0)
Monocytes Relative: 12 %
Neutro Abs: 7.8 K/uL — ABNORMAL HIGH (ref 1.7–7.7)
Neutrophils Relative %: 70 %
Platelets: 227 K/uL (ref 150–400)
RBC: 3.71 MIL/uL — ABNORMAL LOW (ref 3.87–5.11)
RDW: 14.5 % (ref 11.5–15.5)
WBC: 11 K/uL — ABNORMAL HIGH (ref 4.0–10.5)
nRBC: 0 % (ref 0.0–0.2)

## 2024-05-24 LAB — GLUCOSE, CAPILLARY
Glucose-Capillary: 129 mg/dL — ABNORMAL HIGH (ref 70–99)
Glucose-Capillary: 168 mg/dL — ABNORMAL HIGH (ref 70–99)
Glucose-Capillary: 101 mg/dL — ABNORMAL HIGH (ref 70–99)
Glucose-Capillary: 271 mg/dL — ABNORMAL HIGH (ref 70–99)

## 2024-05-24 LAB — MAGNESIUM: Magnesium: 2 mg/dL (ref 1.7–2.4)

## 2024-05-24 MED ORDER — FUROSEMIDE 40 MG PO TABS
40.0000 mg | ORAL_TABLET | ORAL | Status: DC
Start: 2024-05-25 — End: 2024-05-25

## 2024-05-24 NOTE — Progress Notes (Signed)
 PROGRESS NOTE   Catherine Andrews  FMW:[id number]    DOB: 02/25/1935    DOA: 05/19/2024  PCP: Fleeta Valeria Mayo, MD   I have briefly reviewed patients previous [facility] in [facility].   Brief Hospital Course:   89 year old female, resident of independent living at [facility] home, history of insulin -dependent type 2 diabetes, hypertension, melanoma, metastatic lung cancer on chemotherapy, paroxysmal A-fib on aspirin  admitted to the hospital with acute hypoxic respiratory failure felt secondary to her COVID pneumonia and CAP.    Assessment & Plan:   Acute hypoxic respiratory failure secondary to Covid pneumonia and Community acquired pneumonia - Patient noted to have been exposed to someone at church with cold-like symptoms/upper respiratory symptoms over the weekend and noted to have a cough, dyspnea on exertion and progressive fatigue for the past 3 to 4 days prior to admission. - SARS coronavirus 2 PCR positive. Influenza A and B PCR negative. - Chest x-ray: Increased right basilar opacity concerning for pneumonia or atelectasis. - Patient noted on presentation to have had fevers, leukocytosis, dyspnea, cough, and was placed on empiric antibiotics of doxycycline  and Rocephin  to also cover CAP.  She completed 5 days of IV ceftriaxone  and doxycycline , transitioned to Augmentin  to complete total 7 days course. - Patient also noted to have met criteria for antiviral COVID therapy due to hypoxia, symptoms occurring <5 days and patient placed on remdesivir  and oral Decadron .  She has completed 3 days of IV remdesivir . -Patient with complaints of bad dreams and as such Decadron  decreased to 4 mg daily.  Day 6 Decadron  today, gradually taper down. Clinically improved.  Hypoxia resolved.   Paroxysmal A-fib -Continue Toprol -XL for rate control.   - Continue aspirin .    Well-controlled insulin -dependent type 2 diabetes -Hemoglobin A1c of 6.1 on 01/13/2024. While here, CBGs  fluctuating more so until yesterday, likely related to Decadron .  Better today. Continue current Lantus , mealtime NovoLog  and SSI with adjustments of insulin  as needed.   CKD stage IIIb Last known creatinine this year was 1.94 on 01/13/2024.  Since hospital admission, creatinine has fluctuated between 1.33-1.79, likely within her known baseline.  Temporarily held Lasix  for today, encourage p.o. fluid intake, check BMP in a.m. and closely follow as outpatient.   Hyperlipidemia - Continue to hold statin and could likely resume on discharge.    Hypertension - Continue Norvasc  10 mg daily, Toprol -XL 75 mg daily, hydralazine  50 mg 3 times daily, Lasix  40 mg every other day. - Continue to hold ARB. Mostly controlled.  Does have some sinus bradycardia in the 50s but appears stable and tolerating current dose of Toprol -XL.   Depression - Continue Wellbutrin .   GERD - Continue PPI.   Body mass index is 28.32 kg/m.   DVT prophylaxis: heparin  injection 5,000 Units Start: 05/19/24 1400     Code Status: Limited: Do not attempt resuscitation (DNR) -DNR-LIMITED -Do Not Intubate/DNI :  Family Communication: None at bedside Disposition:  Status is: Inpatient Remains inpatient appropriate because: Close monitoring for additional 24 hours and if remains stable, creatinine remains stable, potential DC to SNF tomorrow.     Consultants:   None  Procedures:     Subjective:  Hard of hearing.  Denies dyspnea.  Mild intermittent cough but unable to bring up anything.  No pain reported.  Per nursing, no acute issues.  Objective:   Vitals:   05/23/24 2020 05/24/24 0535 05/24/24 1342 05/24/24 1417  BP: (!) 135/53 (!) 159/103 (!) 137/49 ROLLEN)  142/47  Pulse: 61 (!) 57 63 60  Resp: 17 13 14    Temp: 98 F (36.7 C) 98.6 F (37 C) 98.6 F (37 C) 97.7 F (36.5 C)  TempSrc: Oral Oral Oral Oral  SpO2: 95% 93% 95% 94%  Weight:      Height:        General exam: Elderly female, moderately built and  frail lying comfortably propped up in bed without distress.  Appears to be in good spirits. Respiratory system: Slightly harsh breath sounds but no wheezing, rhonchi or crackles.  No increased work of breathing. Cardiovascular system: S1 & S2 heard, RRR. No JVD, murmurs, rubs, gallops or clicks. No pedal edema.  Telemetry personally reviewed: Mostly sinus bradycardia in the 50s. Gastrointestinal system: Abdomen is nondistended, soft and nontender. No organomegaly or masses felt. Normal bowel sounds heard. Central nervous system: Alert and oriented. No focal neurological deficits.  Hard of hearing. Extremities: Symmetric 5 x 5 power. Skin: No rashes, lesions or ulcers Psychiatry: Judgement and insight appear normal. Mood & affect appropriate.     Data Reviewed:   I have personally reviewed following labs and imaging studies   CBC: Recent Labs  Lab 05/21/24 0336 05/22/24 0329 05/23/24 0405 05/24/24 0414  WBC 11.7* 11.9* 11.6* 11.0*  NEUTROABS 10.3*  --   --  7.8*  HGB 12.4 12.1 12.0 11.9*  HCT 41.6 38.4 40.1 38.2  MCV 107.2* 104.6* 106.1* 103.0*  PLT 157 188 192 227    Basic Metabolic Panel: Recent Labs  Lab 05/20/24 0426 05/21/24 0336 05/22/24 0329 05/23/24 0405 05/24/24 0414  NA 140 139 138 134* 141  K 4.2 4.8 4.5 4.8 4.1  CL 102 102 103 99 102  CO2 23 22 21* 17* 23  GLUCOSE 166* 216* 246* 298* 163*  BUN 42* 45* 50* 59* 72*  CREATININE 1.56* 1.33* 1.43* 1.54* 1.79*  CALCIUM  9.2 9.8 9.8 9.6 9.4  MG  --   --   --  2.5* 2.0    Liver Function Tests: Recent Labs  Lab 05/19/24 1125  AST 23  ALT 15  ALKPHOS 90  BILITOT 0.9  PROT 7.0  ALBUMIN 3.8    CBG: Recent Labs  Lab 05/23/24 2150 05/24/24 0733 05/24/24 1201  GLUCAP 335* 129* 168*    Microbiology Studies:   Recent Results (from the past 240 hours)  Resp panel by RT-PCR (RSV, Flu A&B, Covid) Anterior Nasal Swab     Status: Abnormal   Collection Time: 05/19/24 11:25 AM   Specimen: Anterior Nasal  Swab  Result Value Ref Range Status   SARS Coronavirus 2 by RT PCR POSITIVE (A) NEGATIVE Final    Comment: (NOTE) SARS-CoV-2 target nucleic acids are DETECTED.  The SARS-CoV-2 RNA is generally detectable in upper respiratory specimens during the acute phase of infection. Positive results are indicative of the presence of the identified virus, but do not rule out bacterial infection or co-infection with other pathogens not detected by the test. Clinical correlation with patient history and other diagnostic information is necessary to determine patient infection status. The expected result is Negative.  Fact Sheet for Patients: BloggerCourse.com  Fact Sheet for Healthcare Providers: SeriousBroker.it  This test is not yet approved or cleared by the [place]  FDA and  has been authorized for detection and/or diagnosis of SARS-CoV-2 by FDA under an Emergency Use Authorization (EUA).  This EUA will remain in effect (meaning this test can be used) for the duration of  the COVID-19 declaration under  Section 564(b)(1) of the A ct, 21 U.S.C. section 360bbb-3(b)(1), unless the authorization is terminated or revoked sooner.     Influenza A by PCR NEGATIVE NEGATIVE Final   Influenza B by PCR NEGATIVE NEGATIVE Final    Comment: (NOTE) The Xpert Xpress SARS-CoV-2/FLU/RSV plus assay is intended as an aid in the diagnosis of influenza from Nasopharyngeal swab specimens and should not be used as a sole basis for treatment. Nasal washings and aspirates are unacceptable for Xpert Xpress SARS-CoV-2/FLU/RSV testing.  Fact Sheet for Patients: BloggerCourse.com  Fact Sheet for Healthcare Providers: SeriousBroker.it  This test is not yet approved or cleared by the [place]  FDA and has been authorized for detection and/or diagnosis of SARS-CoV-2 by FDA under an Emergency Use Authorization  (EUA). This EUA will remain in effect (meaning this test can be used) for the duration of the COVID-19 declaration under Section 564(b)(1) of the Act, 21 U.S.C. section 360bbb-3(b)(1), unless the authorization is terminated or revoked.     Resp Syncytial Virus by PCR NEGATIVE NEGATIVE Final    Comment: (NOTE) Fact Sheet for Patients: BloggerCourse.com  Fact Sheet for Healthcare Providers: SeriousBroker.it  This test is not yet approved or cleared by the [place]  FDA and has been authorized for detection and/or diagnosis of SARS-CoV-2 by FDA under an Emergency Use Authorization (EUA). This EUA will remain in effect (meaning this test can be used) for the duration of the COVID-19 declaration under Section 564(b)(1) of the Act, 21 U.S.C. section 360bbb-3(b)(1), unless the authorization is terminated or revoked.  Performed at [facility], [address] [place], [place] [address]     Radiology Studies:  No results found.  Scheduled Meds:    amLODipine   10 mg Oral Daily   amoxicillin -clavulanate  1 tablet Oral BID   aspirin   81 mg Oral QPM   buPROPion   150 mg Oral Daily   dexamethasone   4 mg Oral Daily   fluticasone   1 spray Each Nare Daily   [START ON 05/25/2024] furosemide   40 mg Oral QODAY   guaiFENesin   600 mg Oral BID   heparin   5,000 Units Subcutaneous Q8H   hydrALAZINE   50 mg Oral Q8H   insulin  aspart  0-15 Units Subcutaneous TID WC   insulin  aspart  0-5 Units Subcutaneous QHS   insulin  aspart  4 Units Subcutaneous TID WC   insulin  glargine  40 Units Subcutaneous QHS   ipratropium-albuterol   3 mL Nebulization TID   metoprolol  succinate  75 mg Oral Daily   pantoprazole   40 mg Oral Daily   sodium bicarbonate   650 mg Oral BID   venlafaxine  XR  75 mg Oral Q breakfast    Continuous Infusions:     LOS: 5 days     Trenda Mar, MD,  FACP, [facility], [facility], [facility]   [facility]  & Physician Advisor [place]      To contact the attending provider between 7A-7P or the covering provider during after hours 7P-7A, please log into the web site [email or web address] and access using universal [place] password for that web site. If you do not have the password, please call the hospital operator.  05/24/2024, 2:28 PM

## 2024-05-24 NOTE — [place] FL2 (Signed)
   MEDICAID FL2 LEVEL OF CARE FORM     IDENTIFICATION  Patient Name: Catherine Andrews Birthdate: Jul 21, 1935 Sex: female Admission Date (Current Location): 05/19/2024  [facility] and [place] Number:  Producer, television/film/video and Address:  [facility],  [address] [place], [place] [address]      Provider Number: [id number]  Attending Physician Name and Address:  Judeth Trenda BIRCH, MD  Relative Name and Phone Number:  Simuel Doffing (Granddaughter)  [phone] [facility])    Current Level of Care: Hospital Recommended Level of Care: Skilled Nursing Facility Prior Approval Number:    Date Approved/Denied:   PASRR Number: [id number]  Discharge Plan: SNF    Current Diagnoses: Patient Active Problem List   Diagnosis Date Noted   Pneumonia due to COVID-19 virus 05/19/2024   Diabetic nephropathy associated with type 2 diabetes mellitus (HCC) 04/13/2024   Diabetes mellitus (HCC) 01/13/2024   Urinary tract infection without hematuria 12/02/2023   Atrial fibrillation (HCC) 07/15/2023   History of lung cancer 07/15/2023   History of bladder cancer 07/15/2023   BMI 29.0-29.9,adult 07/15/2023   Essential hypertension 10/08/2022   Stage 3 chronic kidney disease (HCC) 10/08/2022   PAD (peripheral artery disease) (HCC) 04/21/2022   Personal history of malignant neoplasm of breast 02/12/2021   Stage 3b chronic kidney disease (HCC) 02/12/2021   Hypocalcemia 11/07/2020   Acquired atrophy of thyroid  10/16/2020   Chronic diastolic congestive heart failure (HCC) 10/16/2020   Pain due to onychomycosis of toenails of both feet 10/16/2020   Pain in left foot 01/01/2020   Abnormal PET scan of colon 06/19/2019   Anemia 11/11/2017   Black stools 11/11/2017   Hyperthyroidism 10/26/2017   Chronic fatigue 09/22/2017   Lethargy 09/17/2017   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) 04/15/2017   Presence of arterial stent 04/23/2016   Left foot drop 01/31/2016   Wound of left  leg 01/31/2016   Non-healing wound of lower extremity 01/17/2016   Cellulitis and abscess of leg 12/05/2015   Diabetic polyneuropathy associated with type 2 diabetes mellitus (HCC) 10/07/2015   Presence of aortocoronary bypass graft 07/28/2015   Gastroesophageal reflux disease 02/12/2015   Atrial fibrillation with RVR (HCC) 11/12/2014   Depression 11/12/2014   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) 11/12/2014   Hyperkalemia 11/22/2013   NSTEMI (non-ST elevated myocardial infarction) (HCC) 11/22/2013   Carotid bruit 07/20/2013   Shortness of breath 06/06/2013   Coronary artery disease 05/09/2013   Hyperlipidemia 05/09/2013   Atherosclerosis of native artery of extremity (HCC) 03/28/2013   Intermittent claudication (HCC) 03/28/2013    Orientation RESPIRATION BLADDER Height & Weight     Self, Time, Situation, Place  Normal Incontinent Weight: 165 lb (74.8 kg) Height:  5' 4 (162.6 cm)  BEHAVIORAL SYMPTOMS/MOOD NEUROLOGICAL BOWEL NUTRITION STATUS      Continent Diet (CArb mod)  AMBULATORY STATUS COMMUNICATION OF NEEDS Skin   Limited Assist Verbally Normal                       Personal Care Assistance Level of Assistance  Bathing, Dressing, Feeding Bathing Assistance: Limited assistance Feeding assistance: Independent Dressing Assistance: Limited assistance     Functional Limitations Info  Sight, Hearing, Speech Sight Info: Adequate Hearing Info: Adequate Speech Info: Adequate    SPECIAL CARE FACTORS FREQUENCY  PT (By licensed PT), OT (By licensed OT)     PT Frequency: 5 x a week OT Frequency: 5 x a week  Contractures Contractures Info: Not present    Additional Factors Info  Code Status, Allergies Code Status Info: DNR Allergies Info: Codeine  Latex  Losartan   Metformin Hcl           Current Medications (05/24/2024):  This is the current hospital active medication list Current Facility-Administered Medications  Medication Dose  Route Frequency Provider Last Rate Last Admin   acetaminophen  (TYLENOL ) tablet 650 mg  650 mg Oral Q6H PRN Zella, Mir M, MD   650 mg at 05/22/24 2309   Or   acetaminophen  (TYLENOL ) suppository 650 mg  650 mg Rectal Q6H PRN Zella, Mir M, MD       albuterol  (PROVENTIL ) (2.5 MG/3ML) 0.083% nebulizer solution 2.5 mg  2.5 mg Nebulization Q2H PRN Zella, Mir M, MD       amLODipine  (NORVASC ) tablet 10 mg  10 mg Oral Daily Sebastian Toribio GAILS, MD   10 mg at 05/24/24 1346   amoxicillin -clavulanate (AUGMENTIN ) 500-125 MG per tablet 1 tablet  1 tablet Oral BID Sebastian Toribio GAILS, MD   1 tablet at 05/24/24 1346   aspirin  chewable tablet 81 mg  81 mg Oral QPM Sebastian Toribio GAILS, MD   81 mg at 05/23/24 1850   buPROPion  (WELLBUTRIN  XL) 24 hr tablet 150 mg  150 mg Oral Daily Sebastian Toribio GAILS, MD   150 mg at 05/24/24 1347   dexamethasone  (DECADRON ) tablet 4 mg  4 mg Oral Daily Sebastian Toribio GAILS, MD   4 mg at 05/24/24 1347   fluticasone  (FLONASE ) 50 MCG/ACT nasal spray 1 spray  1 spray Each Nare Daily Sebastian Toribio GAILS, MD   1 spray at 05/24/24 1354   [START ON 05/25/2024] furosemide  (LASIX ) tablet 40 mg  40 mg Oral QODAY Hongalgi, Anand D, MD       guaiFENesin  (MUCINEX ) 12 hr tablet 600 mg  600 mg Oral BID Sebastian Toribio GAILS, MD   600 mg at 05/24/24 1347   guaiFENesin -dextromethorphan  (ROBITUSSIN DM) 100-10 MG/5ML syrup 5 mL  5 mL Oral Q4H PRN Zella, Mir M, MD   5 mL at 05/22/24 2309   heparin  injection 5,000 Units  5,000 Units Subcutaneous Q8H Zella, Mir M, MD   5,000 Units at 05/24/24 1352   hydrALAZINE  (APRESOLINE ) tablet 50 mg  50 mg Oral Q8H Sebastian Toribio GAILS, MD   50 mg at 05/24/24 1340   insulin  aspart (novoLOG ) injection 0-15 Units  0-15 Units Subcutaneous TID WC Zella Katha HERO, MD   3 Units at 05/24/24 1337   insulin  aspart (novoLOG ) injection 0-5 Units  0-5 Units Subcutaneous QHS Zella Katha HERO, MD   4 Units at 05/23/24 2216   insulin  aspart (novoLOG ) injection 4 Units  4  Units Subcutaneous TID WC Sebastian Toribio GAILS, MD   4 Units at 05/24/24 1338   insulin  glargine (LANTUS ) injection 40 Units  40 Units Subcutaneous QHS Sebastian Toribio GAILS, MD   40 Units at 05/23/24 2216   ipratropium-albuterol  (DUONEB) 0.5-2.5 (3) MG/3ML nebulizer solution 3 mL  3 mL Nebulization TID Sebastian Toribio GAILS, MD   3 mL at 05/24/24 1357   metoprolol  succinate (TOPROL -XL) 24 hr tablet 75 mg  75 mg Oral Daily Sebastian Toribio GAILS, MD   75 mg at 05/24/24 1348   ondansetron  (ZOFRAN ) tablet 4 mg  4 mg Oral Q6H PRN Zella, Mir M, MD       Or   ondansetron  (ZOFRAN ) injection 4 mg  4 mg Intravenous Q6H PRN Zella Katha HERO, MD  pantoprazole  (PROTONIX ) EC tablet 40 mg  40 mg Oral Daily Ikramullah, Mir M, MD   40 mg at 05/24/24 1346   sodium bicarbonate  tablet 650 mg  650 mg Oral BID Sebastian Toribio GAILS, MD   650 mg at 05/24/24 1347   traZODone  (DESYREL ) tablet 25 mg  25 mg Oral QHS PRN Zella Katha HERO, MD   25 mg at 05/23/24 2220   venlafaxine  XR (EFFEXOR -XR) 24 hr capsule 75 mg  75 mg Oral Q breakfast Sebastian Toribio GAILS, MD   75 mg at 05/24/24 1340     Discharge Medications: Please see discharge summary for a list of discharge medications.  Relevant Imaging Results:  Relevant Lab Results:   Additional Information SSN [id number]  Tawni HERO Dorrine Montone, LCSW

## 2024-05-24 NOTE — Plan of Care (Signed)
  Problem: Education: Goal: Knowledge of General Education information will improve Description: Including pain rating scale, medication(s)/side effects and non-pharmacologic comfort measures Outcome: Progressing   Problem: Clinical Measurements: Goal: Ability to maintain clinical measurements within normal limits will improve Outcome: Progressing Goal: Diagnostic test results will improve Outcome: Progressing   Problem: Safety: Goal: Ability to remain free from injury will improve Outcome: Progressing

## 2024-05-25 DIAGNOSIS — J9601 Acute respiratory failure with hypoxia: Secondary | ICD-10-CM

## 2024-05-25 DIAGNOSIS — J189 Pneumonia, unspecified organism: Secondary | ICD-10-CM

## 2024-05-25 LAB — BASIC METABOLIC PANEL WITH GFR
Anion gap: 15 (ref 5–15)
BUN: 72 mg/dL — ABNORMAL HIGH (ref 8–23)
CO2: 22 mmol/L (ref 22–32)
Calcium: 9.4 mg/dL (ref 8.9–10.3)
Chloride: 104 mmol/L (ref 98–111)
Creatinine, Ser: 1.67 mg/dL — ABNORMAL HIGH (ref 0.44–1.00)
GFR, Estimated: 29 mL/min — ABNORMAL LOW (ref >60)
Glucose, Bld: 190 mg/dL — ABNORMAL HIGH (ref 70–99)
Potassium: 4.6 mmol/L (ref 3.5–5.1)
Sodium: 140 mmol/L (ref 135–145)

## 2024-05-25 LAB — GLUCOSE, CAPILLARY
Glucose-Capillary: 149 mg/dL — ABNORMAL HIGH (ref 70–99)
Glucose-Capillary: 238 mg/dL — ABNORMAL HIGH (ref 70–99)
Glucose-Capillary: 136 mg/dL — ABNORMAL HIGH (ref 70–99)

## 2024-05-25 MED ORDER — CHOLECALCIFEROL 125 MCG (5000 UT) PO TABS: 5000.0000 [IU] | ORAL_TABLET | ORAL | Status: AC

## 2024-05-25 MED ORDER — GUAIFENESIN-DM 100-10 MG/5ML PO SYRP: 5.0000 mL | ORAL_SOLUTION | ORAL | Status: DC | PRN

## 2024-05-25 MED ORDER — DEXAMETHASONE 4 MG PO TABS: 4.0000 mg | ORAL_TABLET | Freq: Every day | ORAL | Status: AC

## 2024-05-25 MED ORDER — FUROSEMIDE 40 MG PO TABS: 40.0000 mg | ORAL_TABLET | ORAL | Status: DC

## 2024-05-25 NOTE — Plan of Care (Signed)

## 2024-05-25 NOTE — TOC Progression Note (Signed)
 Transition of Care [facility]) - Progression Note    Patient Details  Name: Catherine Andrews MRN: [id number] Date of Birth: [birth year]  [facility]) CM/SW Contact  Tawni CHRISTELLA Eva, LCSW Phone Number:   Clinical Narrative:    CSW spoke with pt's Granddaughter Catherine Andrews to discuss rec for SNF placement. She reports that the pt has agreed she will need a little help before she return back to her independent living apartment. Pt's granddaughter prefers [employer]. CSW explained the process noting pt will not need insurance authorization. IP care management to follow.    Barriers to Discharge: Continued Medical Work up               Expected Discharge Plan and Services                                               Social Drivers of Health (SDOH) Interventions SDOH Screenings   Food Insecurity: No Food Insecurity (05/19/2024)  Housing: Low Risk  (05/19/2024)  Transportation Needs: No Transportation Needs (05/19/2024)  Utilities: Not At Risk (05/19/2024)  Social Connections: Socially Isolated (05/19/2024)  Tobacco Use: Medium Risk (05/19/2024)    Readmission Risk Interventions     No data to display

## 2024-05-25 NOTE — Discharge Instructions (Signed)

## 2024-05-25 NOTE — Progress Notes (Addendum)
 Report called to [place] [facility]([phone]), [facility] LPN. Questions, concerns were denied at this time

## 2024-05-25 NOTE — Care Management Important Message (Signed)
 Important Message  Patient Details 2nd IM Letter mailed to daughter Name: HALLIE ERTL MRN: [id number] Date of Birth: 12-23-1934   Important Message Given:  Yes - Medicare IM     Josephus Harriger 05/25/2024, 3:13 PM

## 2024-05-25 NOTE — TOC Transition Note (Signed)
 Transition of Care [facility]) - Discharge Note   Patient Details  Name: Catherine Andrews MRN: [id number] Date of Birth: 01/13/1935  Transition [facility] Care [facility]) CM/SW Contact:  Tawni CHRISTELLA Eva, LCSW Phone Number: 05/25/2024, 2:00 PM   Clinical Narrative:     Pt to d/c to [facility]. Pt's room 505 , RN to call report to [phone]. CSW spoke with pt's granddaughter who agrees with d/c plan. She is requesting EMS transport. PTAR called, Care management sign off  Final next level of care: Skilled Nursing Facility Barriers to Discharge: Barriers Resolved   Patient Goals and CMS Choice Patient states their goals for this hospitalization and ongoing recovery are:: retrun home          Discharge Placement                    Patient and family notified of of transfer: 05/25/24  Discharge Plan and Services Additional resources added to the After Visit Summary for                                       Social Drivers of Health (SDOH) Interventions SDOH Screenings   Food Insecurity: No Food Insecurity (05/19/2024)  Housing: Low Risk  (05/19/2024)  Transportation Needs: No Transportation Needs (05/19/2024)  Utilities: Not At Risk (05/19/2024)  Social Connections: Socially Isolated (05/19/2024)  Tobacco Use: Medium Risk (05/19/2024)     Readmission Risk Interventions     No data to display

## 2024-05-25 NOTE — Discharge Summary (Signed)
 Physician Discharge Summary  Catherine Andrews FMW:[id number] DOB: [birth year]  PCP: Fleeta Valeria Mayo, MD  Admitted from: Independent living at [facility] Discharged to: SNF  Admit date: 05/19/2024 Discharge date: 05/25/2024  Recommendations for Outpatient Follow-up:    Follow-up Information     MD at SNF Follow up.   Why: To be seen in 3 to 4 days with repeat labs (CBC & BMP).        Fleeta Valeria Mayo, MD. Schedule an appointment as soon as possible for a visit.   Specialty: Internal Medicine Contact information: [address] [place] [address] [phone]                  Home Health: None    Equipment/Devices: TBD at SNF    Discharge Condition: Improved and stable   Code Status: Limited: Do not attempt resuscitation (DNR) -DNR-LIMITED -Do Not Intubate/DNI  Diet recommendation:  Discharge Diet Orders (From admission, onward)     Start     Ordered   05/25/24 0000  Diet - low sodium heart healthy        05/25/24 1223   05/25/24 0000  Diet Carb Modified        05/25/24 1223             Discharge Diagnoses:  Principal Problem:   Pneumonia due to COVID-19 virus Active Problems:   Depression   Gastroesophageal reflux disease   Hyperlipidemia   Stage 3b chronic kidney disease (HCC)   Essential hypertension   Atrial fibrillation (HCC)   Diabetic nephropathy associated with type 2 diabetes mellitus (HCC)   Brief Summary: Brief Hospital Course:   89 year old female, resident of independent living at [facility] home, history of insulin -dependent type 2 diabetes, hypertension, melanoma, metastatic lung cancer on chemotherapy, paroxysmal A-fib on aspirin  admitted to the hospital with acute hypoxic respiratory failure felt secondary to her COVID pneumonia and CAP.      Assessment & Plan:    Acute hypoxic respiratory failure secondary to Covid pneumonia and Community acquired pneumonia - Patient noted to have been exposed to someone at church  with cold-like symptoms/upper respiratory symptoms over the weekend and noted to have a cough, dyspnea on exertion and progressive fatigue for the past 3 to 4 days prior to admission. - SARS coronavirus 2 PCR positive. Influenza A and B PCR negative. - Chest x-ray: Increased right basilar opacity concerning for pneumonia or atelectasis. - Patient noted on presentation to have had fevers, leukocytosis, dyspnea, cough, and was placed on empiric antibiotics of doxycycline  and Rocephin  to also cover CAP.  She completed 5 days of IV ceftriaxone  and doxycycline , transitioned to Augmentin  and completed approximately 7-day course.  No further antibiotics at DC. - Patient also noted to have met criteria for antiviral COVID therapy due to hypoxia, symptoms occurring <5 days and patient placed on remdesivir  and oral Decadron .  She has completed 3 days of IV remdesivir . -Patient with complaints of bad dreams and as such Decadron  decreased to 4 mg daily.  Day 7 Decadron  today.  Discharge complete additional 3 days of Decadron . Clinically improved.  Hypoxia resolved. Recommend repeating chest x-ray in 4 weeks to ensure resolution of pneumonia findings.   Paroxysmal A-fib -Continue Toprol -XL for rate control.   - Continue aspirin .    Well-controlled insulin -dependent type 2 diabetes -Hemoglobin A1c of 6.1 on 01/13/2024. While here, CBGs fluctuating more so until yesterday, likely related to Decadron .  Better for the last 2 days. At  discharge, continue prior home regimen of antidiabetic meds including insulins and p.o. meds as below with close monitoring and adjustment of doses as deemed necessary.   CKD stage IIIb Last known creatinine this year was 1.94 on 01/13/2024.  Since hospital admission, creatinine has fluctuated between 1.33-1.79, likely within her known baseline.  Temporarily held Lasix  and at discharge resume all of her home meds including ARB and Lasix  with close monitoring of BMP at SNF.  Believe her  creatinine is within her baseline range.   Hyperlipidemia Continue prior home statins.   Hypertension - Continue Norvasc  10 mg daily, Toprol -XL 75 mg daily, hydralazine  50 mg 3 times daily, Lasix  40 mg every other day and losartan . Fair control.   Depression - Continue Wellbutrin .   GERD - Continue PPI.   Body mass index is 28.32 kg/m.    Consultants:   None   Procedures:       Discharge Instructions  Discharge Instructions     (HEART FAILURE PATIENTS) Call MD:  Anytime you have any of the following symptoms: 1) 3 pound weight gain in 24 hours or 5 pounds in 1 week 2) shortness of breath, with or without a dry hacking cough 3) swelling in the hands, feet or stomach 4) if you have to sleep on extra pillows at night in order to breathe.   Complete by: As directed    Call MD for:  difficulty breathing, headache or visual disturbances   Complete by: As directed    Call MD for:  extreme fatigue   Complete by: As directed    Call MD for:  persistant dizziness or light-headedness   Complete by: As directed    Call MD for:  persistant nausea and vomiting   Complete by: As directed    Call MD for:  severe uncontrolled pain   Complete by: As directed    Call MD for:  temperature >100.4   Complete by: As directed    Diet - low sodium heart healthy   Complete by: As directed    Diet Carb Modified   Complete by: As directed    Increase activity slowly   Complete by: As directed         Medication List     TAKE these medications    acetaminophen  325 MG tablet Commonly known as: TYLENOL  Take 325 mg by mouth every 6 (six) hours as needed for moderate pain.   Admelog SoloStar 100 UNIT/ML KwikPen Generic drug: insulin  lispro INJECT 18 UNITS SUBQ FOR SUGAR 150, 22 UNITS FOR SUGAR 151-200, 26 UNITS FOR 201-250, 30 UNITS FOR 251-300 THREE TIMES A DAY *EXPIRES 28 DAYS AFTER OPENING*   amLODipine  10 MG tablet Commonly known as: NORVASC  TAKE 1 TABLET BY MOUTH EVERY  EVENING FOR BLOOD PRESSURE   aspirin  81 MG chewable tablet Chew 81 mg by mouth every evening.   atorvastatin  10 MG tablet Commonly known as: LIPITOR Take 2 tablets (20 mg total) by mouth daily. for cholesterol.   Basaglar  KwikPen 100 UNIT/ML Inject 40 Units into the skin daily.   buPROPion  150 MG 24 hr tablet Commonly known as: WELLBUTRIN  XL Take 1 tablet (150 mg total) by mouth daily.   Cholecalciferol  125 MCG (5000 UT) Tabs Take 1 tablet (5,000 Units total) by mouth 3 (three) times a week. Start taking on: May 26, 2024 What changed:  how much to take how to take this when to take this   dexamethasone  4 MG tablet Commonly known as: DECADRON  Take  1 tablet (4 mg total) by mouth daily for 3 days. Start taking on: May 26, 2024   Farxiga 10 MG Tabs tablet Generic drug: dapagliflozin propanediol TAKE 1 TABLET BY MOUTH EVERY MORNING   fluticasone  50 MCG/ACT nasal spray Commonly known as: FLONASE  Place 1 spray into both nostrils in the morning and at bedtime.   furosemide  40 MG tablet Commonly known as: LASIX  TAKE 1 TABLET BY MOUTH EVERY OTHER DAY WITH POTASSIUM   guaiFENesin -dextromethorphan  100-10 MG/5ML syrup Commonly known as: ROBITUSSIN DM Take 5 mLs by mouth every 4 (four) hours as needed for cough (chest congestion).   hydrALAZINE  50 MG tablet Commonly known as: APRESOLINE  TAKE 1 TABLET BY MOUTH THREE TIMES A DAY WITH FOOD   losartan  100 MG tablet Commonly known as: COZAAR  Take 1 tablet (100 mg total) by mouth daily.   metoprolol  succinate 50 MG 24 hr tablet Commonly known as: TOPROL -XL TAKE 1 & 1/2 TABLET = 75 MG BY MOUTH ONCE DAILY   omeprazole 20 MG capsule Commonly known as: PRILOSEC TAKE 1 CAPSULE BY MOUTH EVERY MORNING, 30 MINUTES BEFORE MORNING MEAL *DO NOT CRUSH OR CHEW*   potassium chloride  10 MEQ tablet Commonly known as: KLOR-CON  M TAKE 1 TABLET BY MOUTH EVERY OTHER DAY WITH LASIX  *TAKE WITH FOOD* *DO NOT CRUSH OR CHEW* *MAY  DISSOLVE*   PreserVision AREDS Caps Take 1 capsule by mouth in the morning and at bedtime.   traZODone  50 MG tablet Commonly known as: DESYREL  TAKE 1/2 TABLET= 25 MG BY MOUTH EVERY NIGHT AT BEDTIME   venlafaxine  XR 75 MG 24 hr capsule Commonly known as: EFFEXOR -XR TAKE 1 CAPSULE BY MOUTH ONCE DAILY *TAKE WITH FOOD* *DO NOT CRUSH OR CHEW*   VITAMIN B 12 PO Take 1 tablet by mouth daily.       Allergies  Allergen Reactions   Codeine Other (See Comments)     HALLUCINATIONS    Latex Itching, Hives and Rash    Per patient break outs  adhesives   Losartan  Other (See Comments)    decline gfr   Metformin Hcl Diarrhea      Procedures/Studies: DG Chest Portable 1 View Result Date: 05/19/2024 CLINICAL DATA:  Shortness of breath. EXAM: PORTABLE CHEST 1 VIEW COMPARISON:  November 07, 2020. FINDINGS: Stable cardiomediastinal silhouette. Left lung is clear. Increased right basilar opacity is noted concerning for pneumonia or atelectasis. Small right pleural effusion may be present. Bony thorax is unremarkable. Sternotomy wires are noted. IMPRESSION: Increased right basilar opacity is noted concerning for pneumonia or atelectasis. Small right pleural effusion may be present. Followup PA and lateral chest X-ray is recommended in 3-4 weeks following trial of antibiotic therapy to ensure resolution and exclude underlying malignancy. Electronically Signed   By: Lynwood Landy Raddle M.D.   On: 05/19/2024 12:08    Subjective: Apart from intermittent mostly nonproductive cough, denies any other complaints.  Has some weakness.  No chest pain or dyspnea reported.  Tolerating feeds.  Per nursing, no acute issues reported.  Discharge Exam:  Vitals:   05/25/24 0500 05/25/24 0523 05/25/24 0827 05/25/24 1014  BP:  (!) 158/80  (!) 157/44  Pulse:  (!) 55  (!) 59  Resp:  18  18  Temp:  98.2 F (36.8 C)  98.4 F (36.9 C)  TempSrc:    Oral  SpO2:  92% 93% 93%  Weight: 75.1 kg     Height:       General  exam: Elderly female, moderately built and  frail lying comfortably propped up in bed without distress.  Appears to be in good spirits. Respiratory system: Slightly diminished breath sounds in the bases but otherwise clear to auscultation without wheezing, rhonchi or crackles.  No increased work of breathing. Cardiovascular system: S1 & S2 heard, RRR. No JVD, murmurs, rubs, gallops or clicks. No pedal edema.  Off telemetry. Gastrointestinal system: Abdomen is nondistended, soft and nontender. No organomegaly or masses felt. Normal bowel sounds heard. Central nervous system: Alert and oriented. No focal neurological deficits.  Hard of hearing. Extremities: Symmetric 5 x 5 power. Skin: No rashes, lesions or ulcers Psychiatry: Judgement and insight appear normal. Mood & affect appropriate.     The results of significant diagnostics from this hospitalization (including imaging, microbiology, ancillary and laboratory) are listed below for reference.     Microbiology: Recent Results (from the past 240 hours)  Resp panel by RT-PCR (RSV, Flu A&B, Covid) Anterior Nasal Swab     Status: Abnormal   Collection Time: 05/19/24 11:25 AM   Specimen: Anterior Nasal Swab  Result Value Ref Range Status   SARS Coronavirus 2 by RT PCR POSITIVE (A) NEGATIVE Final    Comment: (NOTE) SARS-CoV-2 target nucleic acids are DETECTED.  The SARS-CoV-2 RNA is generally detectable in upper respiratory specimens during the acute phase of infection. Positive results are indicative of the presence of the identified virus, but do not rule out bacterial infection or co-infection with other pathogens not detected by the test. Clinical correlation with patient history and other diagnostic information is necessary to determine patient infection status. The expected result is Negative.  Fact Sheet for Patients: BloggerCourse.com  Fact Sheet for Healthcare  Providers: SeriousBroker.it  This test is not yet approved or cleared by the [place]  FDA and  has been authorized for detection and/or diagnosis of SARS-CoV-2 by FDA under an Emergency Use Authorization (EUA).  This EUA will remain in effect (meaning this test can be used) for the duration of  the COVID-19 declaration under Section 564(b)(1) of the A ct, 21 U.S.C. section 360bbb-3(b)(1), unless the authorization is terminated or revoked sooner.     Influenza A by PCR NEGATIVE NEGATIVE Final   Influenza B by PCR NEGATIVE NEGATIVE Final    Comment: (NOTE) The Xpert Xpress SARS-CoV-2/FLU/RSV plus assay is intended as an aid in the diagnosis of influenza from Nasopharyngeal swab specimens and should not be used as a sole basis for treatment. Nasal washings and aspirates are unacceptable for Xpert Xpress SARS-CoV-2/FLU/RSV testing.  Fact Sheet for Patients: BloggerCourse.com  Fact Sheet for Healthcare Providers: SeriousBroker.it  This test is not yet approved or cleared by the [place]  FDA and has been authorized for detection and/or diagnosis of SARS-CoV-2 by FDA under an Emergency Use Authorization (EUA). This EUA will remain in effect (meaning this test can be used) for the duration of the COVID-19 declaration under Section 564(b)(1) of the Act, 21 U.S.C. section 360bbb-3(b)(1), unless the authorization is terminated or revoked.     Resp Syncytial Virus by PCR NEGATIVE NEGATIVE Final    Comment: (NOTE) Fact Sheet for Patients: BloggerCourse.com  Fact Sheet for Healthcare Providers: SeriousBroker.it  This test is not yet approved or cleared by the [place]  FDA and has been authorized for detection and/or diagnosis of SARS-CoV-2 by FDA under an Emergency Use Authorization (EUA). This EUA will remain in effect (meaning this test can be  used) for the duration of the COVID-19 declaration under Section 564(b)(1) of the Act, 21 U.S.C. section  360bbb-3(b)(1), unless the authorization is terminated or revoked.  Performed at [facility], [address] [place], [place] [address]      Labs: CBC: Recent Labs  Lab 05/20/24 0426 05/21/24 0336 05/22/24 0329 05/23/24 0405 05/24/24 0414  WBC 13.8* 11.7* 11.9* 11.6* 11.0*  NEUTROABS  --  10.3*  --   --  7.8*  HGB 11.5* 12.4 12.1 12.0 11.9*  HCT 37.6 41.6 38.4 40.1 38.2  MCV 106.5* 107.2* 104.6* 106.1* 103.0*  PLT 133* 157 188 192 227    Basic Metabolic Panel: Recent Labs  Lab 05/21/24 0336 05/22/24 0329 05/23/24 0405 05/24/24 0414 05/25/24 0441  NA 139 138 134* 141 140  K 4.8 4.5 4.8 4.1 4.6  CL 102 103 99 102 104  CO2 22 21* 17* 23 22  GLUCOSE 216* 246* 298* 163* 190*  BUN 45* 50* 59* 72* 72*  CREATININE 1.33* 1.43* 1.54* 1.79* 1.67*  CALCIUM  9.8 9.8 9.6 9.4 9.4  MG  --   --  2.5* 2.0  --     Liver Function Tests: Recent Labs  Lab 05/19/24 1125  AST 23  ALT 15  ALKPHOS 90  BILITOT 0.9  PROT 7.0  ALBUMIN 3.8    CBG: Recent Labs  Lab 05/24/24 1201 05/24/24 1657 05/24/24 2006 05/25/24 0828 05/25/24 1155  GLUCAP 168* 101* 271* 149* 238*   I discussed with patient's granddaughter via phone, updated care and answered all questions.  She indicated that she was by to see the patient last evening and patient is doing much better compared to when she came in.  Time coordinating discharge: 45 minutes  SIGNED:  Trenda Mar, MD,  FACP, [facility], [facility], [facility]   [facility] [place]     To contact the attending provider between 7A-7P or the covering provider during after hours 7P-7A, please log into the web site [email or web address] and access using universal [facility] password for that web site. If you do not have the password, please call the hospital operator.

## 2024-06-01 DIAGNOSIS — N183 Chronic kidney disease, stage 3 unspecified: Secondary | ICD-10-CM | POA: Diagnosis not present

## 2024-06-01 DIAGNOSIS — U071 COVID-19: Secondary | ICD-10-CM | POA: Diagnosis not present

## 2024-06-01 DIAGNOSIS — I10 Essential (primary) hypertension: Secondary | ICD-10-CM | POA: Diagnosis not present

## 2024-06-01 DIAGNOSIS — E1169 Type 2 diabetes mellitus with other specified complication: Secondary | ICD-10-CM | POA: Diagnosis not present

## 2024-06-02 ENCOUNTER — Emergency Department (HOSPITAL_COMMUNITY)

## 2024-06-02 ENCOUNTER — Encounter (HOSPITAL_COMMUNITY): Payer: Self-pay | Admitting: Internal Medicine

## 2024-06-02 ENCOUNTER — Other Ambulatory Visit: Payer: Self-pay

## 2024-06-02 ENCOUNTER — Observation Stay (HOSPITAL_COMMUNITY)
Admission: EM | Admit: 2024-06-02 | Discharge: 2024-06-03 | Disposition: A | Discharge status: Skilled Nursing Facility | Source: Skilled Nursing Facility | Attending: Internal Medicine | Admitting: Internal Medicine

## 2024-06-02 DIAGNOSIS — Z66 Do not resuscitate: Secondary | ICD-10-CM | POA: Insufficient documentation

## 2024-06-02 DIAGNOSIS — S60229A Contusion of unspecified hand, initial encounter: Secondary | ICD-10-CM

## 2024-06-02 DIAGNOSIS — E785 Hyperlipidemia, unspecified: Secondary | ICD-10-CM | POA: Diagnosis present

## 2024-06-02 DIAGNOSIS — Z7982 Long term (current) use of aspirin: Secondary | ICD-10-CM | POA: Insufficient documentation

## 2024-06-02 DIAGNOSIS — I480 Paroxysmal atrial fibrillation: Secondary | ICD-10-CM | POA: Diagnosis not present

## 2024-06-02 DIAGNOSIS — E11649 Type 2 diabetes mellitus with hypoglycemia without coma: Principal | ICD-10-CM | POA: Insufficient documentation

## 2024-06-02 DIAGNOSIS — I739 Peripheral vascular disease, unspecified: Secondary | ICD-10-CM | POA: Diagnosis not present

## 2024-06-02 DIAGNOSIS — F32A Depression, unspecified: Secondary | ICD-10-CM | POA: Diagnosis not present

## 2024-06-02 DIAGNOSIS — E162 Hypoglycemia, unspecified: Principal | ICD-10-CM

## 2024-06-02 DIAGNOSIS — Z79899 Other long term (current) drug therapy: Secondary | ICD-10-CM | POA: Insufficient documentation

## 2024-06-02 DIAGNOSIS — J90 Pleural effusion, not elsewhere classified: Secondary | ICD-10-CM | POA: Insufficient documentation

## 2024-06-02 DIAGNOSIS — S60221A Contusion of right hand, initial encounter: Secondary | ICD-10-CM | POA: Insufficient documentation

## 2024-06-02 DIAGNOSIS — E1140 Type 2 diabetes mellitus with diabetic neuropathy, unspecified: Secondary | ICD-10-CM | POA: Diagnosis not present

## 2024-06-02 DIAGNOSIS — I129 Hypertensive chronic kidney disease with stage 1 through stage 4 chronic kidney disease, or unspecified chronic kidney disease: Secondary | ICD-10-CM | POA: Insufficient documentation

## 2024-06-02 DIAGNOSIS — Z91040 Latex allergy status: Secondary | ICD-10-CM | POA: Insufficient documentation

## 2024-06-02 DIAGNOSIS — E1121 Type 2 diabetes mellitus with diabetic nephropathy: Secondary | ICD-10-CM | POA: Diagnosis present

## 2024-06-02 DIAGNOSIS — I4891 Unspecified atrial fibrillation: Secondary | ICD-10-CM | POA: Diagnosis present

## 2024-06-02 DIAGNOSIS — I10 Essential (primary) hypertension: Secondary | ICD-10-CM | POA: Diagnosis not present

## 2024-06-02 DIAGNOSIS — K219 Gastro-esophageal reflux disease without esophagitis: Secondary | ICD-10-CM | POA: Diagnosis not present

## 2024-06-02 DIAGNOSIS — S0181XA Laceration without foreign body of other part of head, initial encounter: Secondary | ICD-10-CM | POA: Insufficient documentation

## 2024-06-02 DIAGNOSIS — E1122 Type 2 diabetes mellitus with diabetic chronic kidney disease: Secondary | ICD-10-CM | POA: Diagnosis not present

## 2024-06-02 DIAGNOSIS — W19XXXA Unspecified fall, initial encounter: Secondary | ICD-10-CM | POA: Insufficient documentation

## 2024-06-02 DIAGNOSIS — Z959 Presence of cardiac and vascular implant and graft, unspecified: Secondary | ICD-10-CM | POA: Insufficient documentation

## 2024-06-02 DIAGNOSIS — Z951 Presence of aortocoronary bypass graft: Secondary | ICD-10-CM | POA: Diagnosis not present

## 2024-06-02 DIAGNOSIS — Z794 Long term (current) use of insulin: Secondary | ICD-10-CM | POA: Diagnosis not present

## 2024-06-02 DIAGNOSIS — R296 Repeated falls: Secondary | ICD-10-CM | POA: Diagnosis not present

## 2024-06-02 DIAGNOSIS — S0083XA Contusion of other part of head, initial encounter: Secondary | ICD-10-CM

## 2024-06-02 DIAGNOSIS — E1151 Type 2 diabetes mellitus with diabetic peripheral angiopathy without gangrene: Secondary | ICD-10-CM | POA: Insufficient documentation

## 2024-06-02 DIAGNOSIS — R410 Disorientation, unspecified: Secondary | ICD-10-CM

## 2024-06-02 DIAGNOSIS — S0191XA Laceration without foreign body of unspecified part of head, initial encounter: Secondary | ICD-10-CM | POA: Insufficient documentation

## 2024-06-02 DIAGNOSIS — E119 Type 2 diabetes mellitus without complications: Secondary | ICD-10-CM | POA: Diagnosis not present

## 2024-06-02 DIAGNOSIS — N1832 Chronic kidney disease, stage 3b: Secondary | ICD-10-CM | POA: Diagnosis not present

## 2024-06-02 LAB — URINALYSIS, ROUTINE W REFLEX MICROSCOPIC
Bilirubin Urine: NEGATIVE
Glucose, UA: >=500 mg/dL — AB
Hgb urine dipstick: NEGATIVE
Ketones, ur: NEGATIVE mg/dL
Nitrite: NEGATIVE
Protein, ur: 30 mg/dL — AB
Specific Gravity, Urine: 1.011 (ref 1.005–1.030)
pH: 5 (ref 5.0–8.0)

## 2024-06-02 LAB — CBG MONITORING, ED
Glucose-Capillary: 74 mg/dL (ref 70–99)
Glucose-Capillary: 70 mg/dL (ref 70–99)
Glucose-Capillary: 181 mg/dL — ABNORMAL HIGH (ref 70–99)

## 2024-06-02 LAB — COMPREHENSIVE METABOLIC PANEL WITH GFR
ALT: 38 U/L (ref 0–44)
AST: 26 U/L (ref 15–41)
Albumin: 4 g/dL (ref 3.5–5.0)
Alkaline Phosphatase: 92 U/L (ref 38–126)
Anion gap: 13 (ref 5–15)
BUN: 43 mg/dL — ABNORMAL HIGH (ref 8–23)
CO2: 21 mmol/L — ABNORMAL LOW (ref 22–32)
Calcium: 9.4 mg/dL (ref 8.9–10.3)
Chloride: 109 mmol/L (ref 98–111)
Creatinine, Ser: 1.53 mg/dL — ABNORMAL HIGH (ref 0.44–1.00)
GFR, Estimated: 33 mL/min — ABNORMAL LOW (ref >60)
Glucose, Bld: 55 mg/dL — ABNORMAL LOW (ref 70–99)
Potassium: 3.8 mmol/L (ref 3.5–5.1)
Sodium: 143 mmol/L (ref 135–145)
Total Bilirubin: 0.5 mg/dL (ref 0.0–1.2)
Total Protein: 7.3 g/dL (ref 6.5–8.1)

## 2024-06-02 LAB — CBC WITH DIFFERENTIAL/PLATELET
Abs Immature Granulocytes: 0.28 K/uL — ABNORMAL HIGH (ref 0.00–0.07)
Basophils Absolute: 0.1 K/uL (ref 0.0–0.1)
Basophils Relative: 1 %
Eosinophils Absolute: 0.2 K/uL (ref 0.0–0.5)
Eosinophils Relative: 1 %
HCT: 42.4 % (ref 36.0–46.0)
Hemoglobin: 12.9 g/dL (ref 12.0–15.0)
Immature Granulocytes: 2 %
Lymphocytes Relative: 8 %
Lymphs Abs: 1.2 K/uL (ref 0.7–4.0)
MCH: 31.8 pg (ref 26.0–34.0)
MCHC: 30.4 g/dL (ref 30.0–36.0)
MCV: 104.4 fL — ABNORMAL HIGH (ref 80.0–100.0)
Monocytes Absolute: 1.4 K/uL — ABNORMAL HIGH (ref 0.1–1.0)
Monocytes Relative: 9 %
Neutro Abs: 12.1 K/uL — ABNORMAL HIGH (ref 1.7–7.7)
Neutrophils Relative %: 79 %
Platelets: 215 K/uL (ref 150–400)
RBC: 4.06 MIL/uL (ref 3.87–5.11)
RDW: 15.1 % (ref 11.5–15.5)
WBC: 15.3 K/uL — ABNORMAL HIGH (ref 4.0–10.5)
nRBC: 0 % (ref 0.0–0.2)

## 2024-06-02 LAB — TROPONIN T, HIGH SENSITIVITY
Troponin T High Sensitivity: 16 ng/L (ref 0–19)
Troponin T High Sensitivity: 18 ng/L (ref 0–19)

## 2024-06-02 LAB — GLUCOSE, CAPILLARY: Glucose-Capillary: 222 mg/dL — ABNORMAL HIGH (ref 70–99)

## 2024-06-02 MED ORDER — INSULIN ASPART 100 UNIT/ML IJ SOLN
0.0000 [IU] | Freq: Every day | INTRAMUSCULAR | Status: DC
  Administered 2024-06-02: 2 [IU] via SUBCUTANEOUS
  Filled 2024-06-02: qty 0.05

## 2024-06-02 MED ORDER — DAPAGLIFLOZIN PROPANEDIOL 10 MG PO TABS
10.0000 mg | ORAL_TABLET | Freq: Every morning | ORAL | Status: DC
  Administered 2024-06-03: 10 mg via ORAL
  Filled 2024-06-02: qty 1

## 2024-06-02 MED ORDER — ACETAMINOPHEN 650 MG RE SUPP: 650.0000 mg | Freq: Four times a day (QID) | RECTAL | Status: DC | PRN

## 2024-06-02 MED ORDER — ATORVASTATIN CALCIUM 10 MG PO TABS
20.0000 mg | ORAL_TABLET | Freq: Every day | ORAL | Status: DC
  Administered 2024-06-03: 20 mg via ORAL
  Filled 2024-06-02: qty 2

## 2024-06-02 MED ORDER — ACETAMINOPHEN 325 MG PO TABS: 650.0000 mg | ORAL_TABLET | Freq: Four times a day (QID) | ORAL | Status: DC | PRN

## 2024-06-02 MED ORDER — TRAZODONE HCL 50 MG PO TABS: 25.0000 mg | ORAL_TABLET | Freq: Every evening | ORAL | Status: DC | PRN

## 2024-06-02 MED ORDER — INSULIN GLARGINE 100 UNIT/ML ~~LOC~~ SOLN
20.0000 [IU] | Freq: Every day | SUBCUTANEOUS | Status: DC
  Administered 2024-06-03: 20 [IU] via SUBCUTANEOUS
  Filled 2024-06-02: qty 0.2

## 2024-06-02 MED ORDER — VENLAFAXINE HCL ER 75 MG PO CP24
75.0000 mg | ORAL_CAPSULE | Freq: Every day | ORAL | Status: DC
Start: 2024-06-03 — End: 2024-06-03
  Administered 2024-06-03: 75 mg via ORAL

## 2024-06-02 MED ORDER — ASPIRIN 81 MG PO CHEW
81.0000 mg | CHEWABLE_TABLET | Freq: Every evening | ORAL | Status: DC
  Administered 2024-06-02: 81 mg via ORAL
  Filled 2024-06-02: qty 1

## 2024-06-02 MED ORDER — ALBUTEROL SULFATE (2.5 MG/3ML) 0.083% IN NEBU: 2.5000 mg | INHALATION_SOLUTION | RESPIRATORY_TRACT | Status: DC | PRN

## 2024-06-02 MED ORDER — ONDANSETRON HCL 4 MG PO TABS: 4.0000 mg | ORAL_TABLET | Freq: Four times a day (QID) | ORAL | Status: DC | PRN

## 2024-06-02 MED ORDER — HEPARIN SODIUM (PORCINE) 5000 UNIT/ML IJ SOLN
5000.0000 [IU] | Freq: Three times a day (TID) | INTRAMUSCULAR | Status: DC
  Administered 2024-06-02 – 2024-06-03 (×2): 5000 [IU] via SUBCUTANEOUS
  Filled 2024-06-02 (×2): qty 1

## 2024-06-02 MED ORDER — PANTOPRAZOLE SODIUM 40 MG PO TBEC
40.0000 mg | DELAYED_RELEASE_TABLET | Freq: Every day | ORAL | Status: DC
  Administered 2024-06-03: 40 mg via ORAL
  Filled 2024-06-02: qty 1

## 2024-06-02 MED ORDER — AMLODIPINE BESYLATE 5 MG PO TABS
10.0000 mg | ORAL_TABLET | Freq: Every day | ORAL | Status: DC
  Administered 2024-06-03: 10 mg via ORAL
  Filled 2024-06-02: qty 2

## 2024-06-02 MED ORDER — LOSARTAN POTASSIUM 50 MG PO TABS
100.0000 mg | ORAL_TABLET | Freq: Every day | ORAL | Status: DC
  Administered 2024-06-03: 100 mg via ORAL
  Filled 2024-06-02: qty 2

## 2024-06-02 MED ORDER — BASAGLAR KWIKPEN 100 UNIT/ML ~~LOC~~ SOPN
20.0000 [IU] | PEN_INJECTOR | Freq: Every day | SUBCUTANEOUS | Status: DC
Start: 2024-06-03 — End: 2024-06-02

## 2024-06-02 MED ORDER — BUPROPION HCL ER (XL) 150 MG PO TB24
150.0000 mg | ORAL_TABLET | Freq: Every day | ORAL | Status: DC
  Administered 2024-06-03: 150 mg via ORAL
  Filled 2024-06-02: qty 1

## 2024-06-02 MED ORDER — ONDANSETRON HCL 4 MG/2ML IJ SOLN: 4.0000 mg | Freq: Four times a day (QID) | INTRAMUSCULAR | Status: DC | PRN

## 2024-06-02 MED ORDER — DEXTROSE 10 % IV SOLN: INTRAVENOUS | Status: AC

## 2024-06-02 MED ORDER — HYDRALAZINE HCL 25 MG PO TABS
50.0000 mg | ORAL_TABLET | Freq: Three times a day (TID) | ORAL | Status: DC
  Administered 2024-06-02 – 2024-06-03 (×2): 50 mg via ORAL
  Filled 2024-06-02 (×2): qty 2

## 2024-06-02 MED ORDER — INSULIN ASPART 100 UNIT/ML IJ SOLN
0.0000 [IU] | Freq: Three times a day (TID) | INTRAMUSCULAR | Status: DC
  Administered 2024-06-03 (×2): 5 [IU] via SUBCUTANEOUS
  Filled 2024-06-02: qty 0.15

## 2024-06-02 NOTE — ED Provider Notes (Signed)
 [place] EMERGENCY DEPARTMENT AT [facility] Provider Note   CSN: [id number] Arrival date & time: 06/02/24  9070     Patient presents with: Hypoglycemia and Fall   Catherine Andrews is a 89 y.o. female.    Hypoglycemia Fall     Patient is a resident of [facility].  Patient has a history of breast cancer, lung cancer, hypertension, hypercholesterolemia, CHF, coronary artery disease, atrial fibrillation, syncope.  Patient was just admitted to the hospital on September 12.  She was discharged on the 18th.  Patient was admitted at that time for hypoxic respiratory failure associated pneumonia and covid.  Patient presented to the ED today for evaluation after a fall.  Patient was found on the ground.  Patient is not able to tell me what happened.  Staff noted an abrasion to her forehead.  Patient denies having any complaints of headache.  She is not having any chest pain or abdominal pain.  She denies shortness of breath.  EMS did note the patient was hypoglycemic with a sugar of 33.  She was given oral glucose and her blood sugar 77  Prior to Admission medications   Medication Sig Start Date End Date Taking? Authorizing Provider  acetaminophen  (TYLENOL ) 325 MG tablet Take 325 mg by mouth every 6 (six) hours as needed for moderate pain. 11/15/17   [provider]  ADMELOG SOLOSTAR 100 UNIT/ML KwikPen INJECT 18 UNITS SUBQ FOR SUGAR 150, 22 UNITS FOR SUGAR 151-200, 26 UNITS FOR 201-250, 30 UNITS FOR 251-300 THREE TIMES A DAY *EXPIRES 28 DAYS AFTER OPENING* 05/25/23   Fleeta Finger, Selinda, MD  amLODipine  (NORVASC ) 10 MG tablet TAKE 1 TABLET BY MOUTH EVERY EVENING FOR BLOOD PRESSURE 11/30/23   Fleeta Finger Selinda, MD  aspirin  81 MG chewable tablet Chew 81 mg by mouth every evening.    [provider]  atorvastatin  (LIPITOR) 10 MG tablet Take 2 tablets (20 mg total) by mouth daily. for cholesterol. 02/22/24   Fleeta Finger Selinda, MD  buPROPion  (WELLBUTRIN  XL) 150 MG 24 hr  tablet Take 1 tablet (150 mg total) by mouth daily. 10/28/23   Amin, Saad, MD  Cholecalciferol  125 MCG (5000 UT) TABS Take 1 tablet (5,000 Units total) by mouth 3 (three) times a week. 05/26/24   Hongalgi, Anand D, MD  Cyanocobalamin (VITAMIN B 12 PO) Take 1 tablet by mouth daily.    [provider]  FARXIGA  10 MG TABS tablet TAKE 1 TABLET BY MOUTH EVERY MORNING 11/30/23   Fleeta Finger, Selinda, MD  fluticasone  (FLONASE ) 50 MCG/ACT nasal spray Place 1 spray into both nostrils in the morning and at bedtime. 10/14/23 10/13/24  Fleeta Finger Selinda, MD  furosemide  (LASIX ) 40 MG tablet TAKE 1 TABLET BY MOUTH EVERY OTHER DAY WITH POTASSIUM 12/01/23   Okey Vina GAILS, MD  guaiFENesin -dextromethorphan  (ROBITUSSIN DM) 100-10 MG/5ML syrup Take 5 mLs by mouth every 4 (four) hours as needed for cough (chest congestion). 05/25/24   Hongalgi, Anand D, MD  hydrALAZINE  (APRESOLINE ) 50 MG tablet TAKE 1 TABLET BY MOUTH THREE TIMES A DAY WITH FOOD 09/20/23   Caleen Dirks, MD  Insulin  Glargine (BASAGLAR  KWIKPEN) 100 UNIT/ML Inject 40 Units into the skin daily. 11/03/23   Fleeta Finger Selinda, MD  losartan  (COZAAR ) 100 MG tablet Take 1 tablet (100 mg total) by mouth daily. 06/01/23   Fleeta Finger Selinda, MD  metoprolol  succinate (TOPROL -XL) 50 MG 24 hr tablet TAKE 1 & 1/2 TABLET = 75 MG BY MOUTH ONCE  DAILY 04/04/24   Fleeta Valeria Mayo, MD  Multiple Vitamins-Minerals (PRESERVISION AREDS) CAPS Take 1 capsule by mouth in the morning and at bedtime.    [provider]  omeprazole (PRILOSEC) 20 MG capsule TAKE 1 CAPSULE BY MOUTH EVERY MORNING, 30 MINUTES BEFORE MORNING MEAL *DO NOT CRUSH OR CHEW* 04/04/24   Fleeta Valeria, Mayo, MD  potassium chloride  (KLOR-CON  M) 10 MEQ tablet TAKE 1 TABLET BY MOUTH EVERY OTHER DAY WITH LASIX  *TAKE WITH FOOD* *DO NOT CRUSH OR CHEW* *MAY DISSOLVE* 01/25/24   Okey Vina GAILS, MD  traZODone  (DESYREL ) 50 MG tablet TAKE 1/2 TABLET= 25 MG BY MOUTH EVERY NIGHT AT BEDTIME 03/27/24   Fleeta Valeria Mayo, MD  venlafaxine  XR (EFFEXOR -XR) 75 MG  24 hr capsule TAKE 1 CAPSULE BY MOUTH ONCE DAILY *TAKE WITH FOOD* *DO NOT CRUSH OR CHEW* 11/30/23   Fleeta Valeria Mayo, MD    Allergies: Codeine, Latex, Losartan , and Metformin hcl    Review of Systems  Updated Vital Signs BP (!) 140/42 (BP Location: Left Arm)   Pulse (!) 53   Temp 97.6 F (36.4 C) (Oral)   Resp (!) 23   SpO2 97%   Physical Exam Vitals and nursing note reviewed.  Constitutional:      Appearance: She is well-developed. She is ill-appearing. She is not diaphoretic.  HENT:     Head: Normocephalic.     Comments: Superficial laceration abrasion forehead, hematoma underlying    Right Ear: External ear normal.     Left Ear: External ear normal.  Eyes:     General: No scleral icterus.       Right eye: No discharge.        Left eye: No discharge.     Conjunctiva/sclera: Conjunctivae normal.  Neck:     Trachea: No tracheal deviation.  Cardiovascular:     Rate and Rhythm: Normal rate and regular rhythm.  Pulmonary:     Effort: Pulmonary effort is normal. No respiratory distress.     Breath sounds: Normal breath sounds. No stridor. No wheezing or rales.  Abdominal:     General: Bowel sounds are normal. There is no distension.     Palpations: Abdomen is soft.     Tenderness: There is no abdominal tenderness. There is no guarding or rebound.     Comments: Ecchymoses noted around the  Musculoskeletal:        General: No tenderness or deformity.     Cervical back: Neck supple.     Comments: No tenderness palpation bilateral upper and lower extremities other than bruising noted left hand, old bruising also noted in the flank and hip area,   Skin:    General: Skin is warm and dry.     Findings: No rash.  Neurological:     General: No focal deficit present.     Mental Status: She is alert.     Cranial Nerves: No cranial nerve deficit, dysarthria or facial asymmetry.     Sensory: No sensory deficit.     Motor: No abnormal muscle tone or seizure activity.      Coordination: Coordination normal.  Psychiatric:        Mood and Affect: Mood normal.     (all labs ordered are listed, but only abnormal results are displayed) Labs Reviewed  CBC WITH DIFFERENTIAL/PLATELET - Abnormal; Notable for the following components:      Result Value   WBC 15.3 (*)    MCV 104.4 (*)    Neutro Abs  12.1 (*)    Monocytes Absolute 1.4 (*)    Abs Immature Granulocytes 0.28 (*)    All other components within normal limits  COMPREHENSIVE METABOLIC PANEL WITH GFR - Abnormal; Notable for the following components:   CO2 21 (*)    Glucose, Bld 55 (*)    BUN 43 (*)    Creatinine, Ser 1.53 (*)    GFR, Estimated 33 (*)    All other components within normal limits  URINALYSIS, ROUTINE W REFLEX MICROSCOPIC - Abnormal; Notable for the following components:   APPearance HAZY (*)    Glucose, UA >=500 (*)    Protein, ur 30 (*)    Leukocytes,Ua TRACE (*)    Bacteria, UA RARE (*)    All other components within normal limits  CBG MONITORING, ED  CBG MONITORING, ED  TROPONIN T, HIGH SENSITIVITY  TROPONIN T, HIGH SENSITIVITY    EKG: EKG Interpretation Date/Time:  Friday June 02 2024 09:42:59 EDT Ventricular Rate:  54 PR Interval:  201 QRS Duration:  115 QT Interval:  470 QTC Calculation: 446 R Axis:   -12  Text Interpretation: Sinus rhythm Probable left ventricular hypertrophy Inferior infarct, old No significant change since last tracing Confirmed by Randol Simmonds [phone]) on 06/02/2024 10:29:30 AM  Radiology: [facility] Hand Complete Right Result Date: 06/02/2024 EXAM: 3 OR MORE VIEW(S) XRAY OF THE RIGHT HAND 06/02/2024 10:39:00 AM COMPARISON: None available. CLINICAL HISTORY: Syncope. Abrasion to right elbow. FINDINGS: BONES AND JOINTS: I don't see a well-defined underlying fracture. Absent triquetrum; correlate with operative history. Marginal erosions in the head of the middle phalanx index finger, potentially from gout or erosive arthropathy. Spurring at the base of the  first metacarpal with some faint calcifications between the distal pole of the scaphoid and the first metacarpal. No joint dislocation. SOFT TISSUES: Prominent soft tissue swelling dorsal to the distal carpal row and carpometacarpal articulation. IMPRESSION: 1. Prominent dorsal soft tissue swelling at the distal carpal row and carpometacarpal articulation without a definite fracture. 2. Marginal erosions at the head of the index finger middle phalanx, suggestive of gout or erosive arthropathy. 3. Degenerative changes with spurring at the base of the first metacarpal and faint calcifications between the distal scaphoid and first metacarpal. 4. Absent triquetrum. Electronically signed by: Ryan Salvage MD 06/02/2024 11:36 AM EDT RP Workstation: [id number]   DG Pelvis 1-2 Views Result Date: 06/02/2024 EXAM: 1 or 2 VIEW(S) XRAY OF THE PELVIS 06/02/2024 10:39:00 AM COMPARISON: None available. CLINICAL HISTORY: Syncope, low CBG, and presentation with a c-collar via EMS. FINDINGS: BONES AND JOINTS: Lower lumbar spondylosis and degenerative disc disease. Mild bony demineralization. No fracture or acute bony findings identified. Proximal iliac stents noted. Left femoral artery stent noted. Atherosclerosis is present. SOFT TISSUES: The soft tissues are unremarkable. IMPRESSION: 1. No acute bony abnormality. 2. Lower lumbar spondylosis and degenerative disc disease. 3. Atherosclerosis with proximal iliac and left femoral artery stents. Electronically signed by: Ryan Salvage MD 06/02/2024 11:31 AM EDT RP Workstation: [id number]   DG Chest Port 1 View Result Date: 06/02/2024 EXAM: 1 VIEW(S) XRAY OF THE CHEST 06/02/2024 10:39:00 AM COMPARISON: None available. CLINICAL HISTORY: Syncope, unwitnessed. Hypoglycemia (CBG 33, improved to 77 after oral glucose). Presented with c-collar. FINDINGS: LUNGS AND PLEURA: Low lung volumes are present, causing crowding of the pulmonary vasculature. Previous hazy opacity at the  right lung base is no longer well appreciated. No pleural effusion. No pneumothorax. No pulmonary edema. HEART AND MEDIASTINUM: Atheromatous vascular calcification of the aortic arch. No  acute abnormality of the cardiac and mediastinal silhouettes. BONES AND SOFT TISSUES: Thoracic spondylosis noted. Benign-appearing calcification in the right breast as shown on mammography. IMPRESSION: 1. No acute cardiopulmonary process. 2. Low lung volumes causing vascular crowding. 3. Aortic arch atherosclerotic calcification. 4. Thoracic spondylosis. 5. Benign-appearing right breast calcification. Electronically signed by: Ryan Salvage MD 06/02/2024 11:29 AM EDT RP Workstation: [id number]   CT CERVICAL SPINE WO CONTRAST Result Date: 06/02/2024 EXAM: CT CERVICAL SPINE WITHOUT CONTRAST 06/02/2024 11:12:15 AM TECHNIQUE: CT of the cervical spine was performed without the administration of intravenous contrast. Multiplanar reformatted images are provided for review. Automated exposure control, iterative reconstruction, and/or weight based adjustment of the mA/kV was utilized to reduce the radiation dose to as low as reasonably achievable. COMPARISON: None available. CLINICAL HISTORY: Neck trauma (Age >= 65y). [place] - unwitnessed. Abrasion to right elbow, left hand with swelling. Bump to left side of forehead. FINDINGS: CERVICAL SPINE: BONES AND ALIGNMENT: Straightening of the normal cervical lordosis is present. No acute fracture or traumatic malalignment. DEGENERATIVE CHANGES: Mild degenerative changes are present throughout the cervical spine. Anus formation is noted about the dens without significant central canal stenosis. SOFT TISSUES: No prevertebral soft tissue swelling. VASCULATURE: Atherosclerotic calcifications are present at the aortic arch and great vessel origins. Dense calcifications are present at the carotid bifurcations bilaterally with probable left sided stenosis. IMPRESSION: 1. No acute abnormality of  the cervical spine related to the reported neck trauma. 2. Atherosclerotic calcifications at the carotid bifurcations with probable left-sided stenosis; recommend dedicated vascular evaluation as clinically indicated. Electronically signed by: Lonni Necessary MD 06/02/2024 11:22 AM EDT RP Workstation: [id number]   CT HEAD WO CONTRAST Result Date: 06/02/2024 EXAM: CT HEAD WITHOUT CONTRAST 06/02/2024 11:12:15 AM TECHNIQUE: CT of the head was performed without the administration of intravenous contrast. Automated exposure control, iterative reconstruction, and/or weight based adjustment of the mA/kV was utilized to reduce the radiation dose to as low as reasonably achievable. COMPARISON: None available. CLINICAL HISTORY: Head trauma, minor (Age >= 65y). [place] - unwitnessed. Abrasion to right elbow, left hand with swelling. Bump to left side of forehead. FINDINGS: BRAIN AND VENTRICLES: No acute hemorrhage. No evidence of acute infarct. No hydrocephalus. No extra-axial collection. No mass effect or midline shift. Moderate atrophy and white matter changes are present bilaterally. This most likely reflects the sequelae of chronic microvascular ischemia. Atherosclerotic calcifications are present in the cavernous carotid arteries bilaterally and at the dural margin of both vertebral arteries. No hyperdense vessel is present. ORBITS: Bilateral lens replacements are noted. The globes and orbits are otherwise within normal limits. SINUSES: No acute abnormality. SOFT TISSUES AND SKULL: A large left frontal scalp hematoma and laceration is present. No underlying fracture or foreign body is present. IMPRESSION: 1. No acute intracranial abnormality. 2. Large left frontal scalp hematoma and laceration without underlying fracture or foreign body. Electronically signed by: Lonni Necessary MD 06/02/2024 11:20 AM EDT RP Workstation: [id number]     Procedures   Medications Ordered in the ED  dextrose  10 % infusion  ( Intravenous New Bag/Given 06/02/24 1037)    Clinical Course as of 06/02/24 1417  Fri Jun 02, 2024  1101 CBC WITH DIFFERENTIAL(!) White blood cell count elevated at 15.  Urinalysis unremarkable [JK]  1125 CT HEAD WO CONTRAST C-spine CT without acute findings [JK]  1125 Head CT shows frontal scalp hematoma without fracture or intracranial abnormality [JK]  1145 Degenerative changes noted on hand x-ray.  No fracture [JK]  1145 Pelvis x-ray without  acute abnormalities [JK]  1145 Chest x-ray without acute findings [JK]  1254 Serial troponins normal [JK]  1254 Comprehensive metabolic panel(!) Metabolic panel does show decreased glucose of 55 [JK]  1321 CBG monitoring, ED CBC normal [JK]  1330 Scalp wound cleaned.  Some abrasion associated with a very small laceration less than 1 cm no active bleeding.  No indication for suturing.  Bandage applied [JK]    Clinical Course User Index [JK] Randol Simmonds, MD                                 Medical Decision Making Amount and/or Complexity of Data Reviewed Labs: ordered. Decision-making details documented in ED Course. Radiology: ordered. Decision-making details documented in ED Course.  Risk Prescription drug management. Decision regarding hospitalization.   Patient presented ED for evaluation of a fall.  This was not witnessed.  Unclear if she had a syncopal event.  Patient was also noted to be hypoglycemic which could have caused the fall.  Patient is on insulin .  Patient's ED workup does not show any signs of acute infection.  No signs of significant electrolyte abnormality.  Imaging test do not show any signs of serious injury.  Patient was noted to be hypoglycemic again in the ED with blood sugar of 55.  She was given dextrose  IV with some improvement of her sugar now at 70 however she is at risk for recurrent hypoglycemia.  I will consult the medical service for admission evaluation  Case DIscussed with Dr Roxane     Final  diagnoses:  Hypoglycemia  Fall, initial encounter  Contusion of forehead, initial encounter  Contusion of hand, unspecified laterality, initial encounter    ED Discharge Orders     None          Randol Simmonds, MD 06/02/24 1417

## 2024-06-02 NOTE — ED Triage Notes (Signed)
 BIB EMS from [facility] - unwitnessed. Abrasion to right elbow, left hand with swelling. Bump to left side of forehead. CBG was 33. After oral glucose, CBG 77. A/o x4. Presented with c-collar via EMS.

## 2024-06-02 NOTE — Inpatient Diabetes Management (Signed)
 Inpatient Diabetes Program Recommendations  AACE/ADA: New Consensus Statement on Inpatient Glycemic Control (2015)  Target Ranges:  Prepandial:   less than 140 mg/dL      Peak postprandial:   less than 180 mg/dL (1-2 hours)      Critically ill patients:  140 - 180 mg/dL   Lab Results  Component Value Date   GLUCAP 70 06/02/2024   HGBA1C 6.1 (H) 01/13/2024    Review of Glycemic Control  Latest Reference Range & Units 06/02/24 09:40 06/02/24 13:16  Glucose-Capillary 70 - 99 mg/dL 74 70   Diabetes history: Type 2 DM Outpatient Diabetes medications: Farxiga  10 mg every day, Basaglar  40 units every day, Ademelog 18-22 units TID Current: D10% @ 50 ml/hr  Inpatient Diabetes Program Recommendations:    Noted previous hospitalization 05/19/24. Patient was on Basaglar  40 units every day, correction TID & HS, Novolog  4 units TID and Farxiga , however, was on Decadron . Discharge doses were much higher than previous inpatient doses. Additionally, A1C is below goal for age group, thus suspect patient has had tighter control with DM that would recommend.  Anticipate patient needs reduction to insulin  dosing at discharge.    Thanks, Tinnie Minus, MSN, RNC-OB Diabetes Coordinator [phone] (8a-5p)

## 2024-06-02 NOTE — H&P (Signed)
 History and Physical  Catherine Andrews FMW:[id number] DOB: 1934/11/03 DOA: 06/02/2024  PCP: Fleeta Valeria Mayo, MD   Chief Complaint: fall, hypoglycemia   HPI: Catherine Andrews is a 89 y.o. female with medical history significant for insulin -dependent type 2 diabetes, hypertension, metastatic lung cancer on chemotherapy, paroxysmal atrial fibrillation on aspirin  recently admitted to the hospital with hypoxic respiratory failure due to COVID-pneumonia and community-acquired pneumonia now being admitted to the hospital after a fall and found to have hypoglycemia.  She is a resident of independent living at [facility], she was discharged back to her facility on 9/18 in stable condition on room air.  While hospitalized, she was on Decadron  and discharged on 3 more days of this medication, she was discharged home with resumption of her home basal insulin .  States that she was overall doing well, though she has been weak since her hospital discharge.  Today she had a fall at her facility, hitting her left elbow, right wrist, and the left side of her head.  The only thing she remembers is being on the ground after falling, and being told by staff to not try to get up off the ground.  Unable to tell me anything about any prodrome, palpitations, chest pain, or whether she completely lost consciousness.  On evaluation in the emergency department, imaging is detailed below is relatively unremarkable, she was noted to have some hypocalcemia which has been persistent.  Review of Systems: Please see HPI for pertinent positives and negatives. A complete 10 system review of systems are otherwise negative.  Past Medical History:  Diagnosis Date   Anemia    Anesthesia complication    Arthritis    Breast cancer (HCC)    Right Breast- 20 yrs ago   CAD (coronary artery disease)    CHF (congestive heart failure) (HCC)    Colon polyp    Constipation    Depression    Esophageal reflux    Fever    Foot drop     High cholesterol    HTN (hypertension)    Hyperlipemia    Hypertension    Lung cancer (HCC)    Melanoma (HCC)    MI (myocardial infarction) (HCC)    Paroxysmal atrial fibrillation (HCC)    Peripheral arterial disease    Skin ulcer of chin    SOB (shortness of breath)    Syncope and collapse    Uses hearing aid    Vertigo    Wears partial dentures    Past Surgical History:  Procedure Laterality Date   ABDOMINAL HYSTERECTOMY     BREAST BIOPSY     BREAST CYST EXCISION     BREAST LUMPECTOMY Right    20 yrs ago   cardiac stents     CARDIAC SURGERY     CATARACT EXTRACTION     Social History:  reports that she has quit smoking. She has never used smokeless tobacco. She reports current alcohol use of about 2.0 standard drinks of alcohol per week. She reports that she does not use drugs.  Allergies  Allergen Reactions   Codeine Other (See Comments)     HALLUCINATIONS    Latex Itching, Hives and Rash    Per patient break outs  adhesives   Losartan  Other (See Comments)    decline gfr   Metformin Hcl Diarrhea    Family History  Problem Relation Age of Onset   Kidney disease Mother    Diabetes Mother    Heart  failure Father    Diabetes Father    Hyperlipidemia Sister    Diabetes Sister    Diabetes Brother    Lymphoma Brother    Diabetes Maternal Grandmother    Diabetes Paternal Grandfather    Hypertension Other    Diabetes Son      Prior to Admission medications   Medication Sig Start Date End Date Taking? Authorizing Provider  acetaminophen  (TYLENOL ) 325 MG tablet Take 325 mg by mouth every 6 (six) hours as needed for moderate pain. 11/15/17   [provider]  ADMELOG SOLOSTAR 100 UNIT/ML KwikPen INJECT 18 UNITS SUBQ FOR SUGAR 150, 22 UNITS FOR SUGAR 151-200, 26 UNITS FOR 201-250, 30 UNITS FOR 251-300 THREE TIMES A DAY *EXPIRES 28 DAYS AFTER OPENING* 05/25/23   Fleeta Finger, Selinda, MD  amLODipine  (NORVASC ) 10 MG tablet TAKE 1 TABLET BY MOUTH EVERY EVENING FOR  BLOOD PRESSURE 11/30/23   Fleeta Finger Selinda, MD  aspirin  81 MG chewable tablet Chew 81 mg by mouth every evening.    [provider]  atorvastatin  (LIPITOR) 10 MG tablet Take 2 tablets (20 mg total) by mouth daily. for cholesterol. 02/22/24   Fleeta Finger Selinda, MD  buPROPion  (WELLBUTRIN  XL) 150 MG 24 hr tablet Take 1 tablet (150 mg total) by mouth daily. 10/28/23   Amin, Saad, MD  Cholecalciferol  125 MCG (5000 UT) TABS Take 1 tablet (5,000 Units total) by mouth 3 (three) times a week. 05/26/24   Hongalgi, Anand D, MD  Cyanocobalamin (VITAMIN B 12 PO) Take 1 tablet by mouth daily.    [provider]  FARXIGA  10 MG TABS tablet TAKE 1 TABLET BY MOUTH EVERY MORNING 11/30/23   Fleeta Finger, Selinda, MD  fluticasone  (FLONASE ) 50 MCG/ACT nasal spray Place 1 spray into both nostrils in the morning and at bedtime. 10/14/23 10/13/24  Fleeta Finger Selinda, MD  furosemide  (LASIX ) 40 MG tablet TAKE 1 TABLET BY MOUTH EVERY OTHER DAY WITH POTASSIUM 12/01/23   Okey Vina GAILS, MD  guaiFENesin -dextromethorphan  (ROBITUSSIN DM) 100-10 MG/5ML syrup Take 5 mLs by mouth every 4 (four) hours as needed for cough (chest congestion). 05/25/24   Hongalgi, Anand D, MD  hydrALAZINE  (APRESOLINE ) 50 MG tablet TAKE 1 TABLET BY MOUTH THREE TIMES A DAY WITH FOOD 09/20/23   Caleen Dirks, MD  Insulin  Glargine (BASAGLAR  [facility]) 100 UNIT/ML Inject 40 Units into the skin daily. 11/03/23   Fleeta Finger Selinda, MD  losartan  (COZAAR ) 100 MG tablet Take 1 tablet (100 mg total) by mouth daily. 06/01/23   Fleeta Finger Selinda, MD  metoprolol  succinate (TOPROL -XL) 50 MG 24 hr tablet TAKE 1 & 1/2 TABLET = 75 MG BY MOUTH ONCE DAILY 04/04/24   Fleeta Finger, Selinda, MD  Multiple Vitamins-Minerals (PRESERVISION AREDS) CAPS Take 1 capsule by mouth in the morning and at bedtime.    [provider]  omeprazole (PRILOSEC) 20 MG capsule TAKE 1 CAPSULE BY MOUTH EVERY MORNING, 30 MINUTES BEFORE MORNING MEAL *DO NOT CRUSH OR CHEW* 04/04/24   Fleeta Finger, Selinda, MD  potassium chloride   (KLOR-CON  M) 10 MEQ tablet TAKE 1 TABLET BY MOUTH EVERY OTHER DAY WITH LASIX  *TAKE WITH FOOD* *DO NOT CRUSH OR CHEW* *MAY DISSOLVE* 01/25/24   Okey Vina GAILS, MD  traZODone  (DESYREL ) 50 MG tablet TAKE 1/2 TABLET= 25 MG BY MOUTH EVERY NIGHT AT BEDTIME 03/27/24   Fleeta Finger Selinda, MD  venlafaxine  XR (EFFEXOR -XR) 75 MG 24 hr capsule TAKE 1 CAPSULE BY MOUTH ONCE DAILY *TAKE WITH FOOD* *DO NOT CRUSH  OR CHEW* 11/30/23   Fleeta Valeria Mayo, MD    Physical Exam: BP (!) 140/42 (BP Location: Left Arm)   Pulse (!) 53   Temp 97.6 F (36.4 C) (Oral)   Resp (!) 23   SpO2 97%  General:  Alert, oriented, calm, in no acute distress  Eyes: EOMI, clear conjuctivae, white sclerea Neck: supple, no masses, trachea mildline  Cardiovascular: RRR, no murmurs or rubs, no peripheral edema  Respiratory: clear to auscultation bilaterally, no wheezes, no crackles  Abdomen: soft, nontender, nondistended, normal bowel tones heard  Skin: dry, no rashes  Musculoskeletal: She has bruising over the left elbow, the right wrist with tender palpable ecchymosis, as well as left anterior cranial bruising and injury Psychiatric: appropriate affect, normal speech  Neurologic: extraocular muscles intact, clear speech, moving all extremities with intact sensorium         Labs on Admission:  Basic Metabolic Panel: Recent Labs  Lab 06/02/24 0957  NA 143  K 3.8  CL 109  CO2 21*  GLUCOSE 55*  BUN 43*  CREATININE 1.53*  CALCIUM  9.4   Liver Function Tests: Recent Labs  Lab 06/02/24 0957  AST 26  ALT 38  ALKPHOS 92  BILITOT 0.5  PROT 7.3  ALBUMIN 4.0   No results for input(s): LIPASE, AMYLASE in the last 168 hours. No results for input(s): AMMONIA in the last 168 hours. CBC: Recent Labs  Lab 06/02/24 0957  WBC 15.3*  NEUTROABS 12.1*  HGB 12.9  HCT 42.4  MCV 104.4*  PLT 215   Cardiac Enzymes: No results for input(s): CKTOTAL, CKMB, CKMBINDEX, TROPONINI in the last 168 hours. BNP (last 3 results) No  results for input(s): BNP in the last 8760 hours.  ProBNP (last 3 results) Recent Labs    05/19/24 1125  PROBNP 1,045.0*    CBG: Recent Labs  Lab 06/02/24 0940 06/02/24 1316  GLUCAP 74 70    Radiological Exams on Admission: DG Hand Complete Right Result Date: 06/02/2024 EXAM: 3 OR MORE VIEW(S) XRAY OF THE RIGHT HAND 06/02/2024 10:39:00 AM COMPARISON: None available. CLINICAL HISTORY: Syncope. Abrasion to right elbow. FINDINGS: BONES AND JOINTS: I don't see a well-defined underlying fracture. Absent triquetrum; correlate with operative history. Marginal erosions in the head of the middle phalanx index finger, potentially from gout or erosive arthropathy. Spurring at the base of the first metacarpal with some faint calcifications between the distal pole of the scaphoid and the first metacarpal. No joint dislocation. SOFT TISSUES: Prominent soft tissue swelling dorsal to the distal carpal row and carpometacarpal articulation. IMPRESSION: 1. Prominent dorsal soft tissue swelling at the distal carpal row and carpometacarpal articulation without a definite fracture. 2. Marginal erosions at the head of the index finger middle phalanx, suggestive of gout or erosive arthropathy. 3. Degenerative changes with spurring at the base of the first metacarpal and faint calcifications between the distal scaphoid and first metacarpal. 4. Absent triquetrum. Electronically signed by: Ryan Salvage MD 06/02/2024 11:36 AM EDT RP Workstation: [id number]   DG Pelvis 1-2 Views Result Date: 06/02/2024 EXAM: 1 or 2 VIEW(S) XRAY OF THE PELVIS 06/02/2024 10:39:00 AM COMPARISON: None available. CLINICAL HISTORY: Syncope, low CBG, and presentation with a c-collar via EMS. FINDINGS: BONES AND JOINTS: Lower lumbar spondylosis and degenerative disc disease. Mild bony demineralization. No fracture or acute bony findings identified. Proximal iliac stents noted. Left femoral artery stent noted. Atherosclerosis is present.  SOFT TISSUES: The soft tissues are unremarkable. IMPRESSION: 1. No acute bony abnormality. 2. Lower  lumbar spondylosis and degenerative disc disease. 3. Atherosclerosis with proximal iliac and left femoral artery stents. Electronically signed by: Ryan Salvage MD 06/02/2024 11:31 AM EDT RP Workstation: [id number]   DG Chest Port 1 View Result Date: 06/02/2024 EXAM: 1 VIEW(S) XRAY OF THE CHEST 06/02/2024 10:39:00 AM COMPARISON: None available. CLINICAL HISTORY: Syncope, unwitnessed. Hypoglycemia (CBG 33, improved to 77 after oral glucose). Presented with c-collar. FINDINGS: LUNGS AND PLEURA: Low lung volumes are present, causing crowding of the pulmonary vasculature. Previous hazy opacity at the right lung base is no longer well appreciated. No pleural effusion. No pneumothorax. No pulmonary edema. HEART AND MEDIASTINUM: Atheromatous vascular calcification of the aortic arch. No acute abnormality of the cardiac and mediastinal silhouettes. BONES AND SOFT TISSUES: Thoracic spondylosis noted. Benign-appearing calcification in the right breast as shown on mammography. IMPRESSION: 1. No acute cardiopulmonary process. 2. Low lung volumes causing vascular crowding. 3. Aortic arch atherosclerotic calcification. 4. Thoracic spondylosis. 5. Benign-appearing right breast calcification. Electronically signed by: Ryan Salvage MD 06/02/2024 11:29 AM EDT RP Workstation: [id number]   CT CERVICAL SPINE WO CONTRAST Result Date: 06/02/2024 EXAM: CT CERVICAL SPINE WITHOUT CONTRAST 06/02/2024 11:12:15 AM TECHNIQUE: CT of the cervical spine was performed without the administration of intravenous contrast. Multiplanar reformatted images are provided for review. Automated exposure control, iterative reconstruction, and/or weight based adjustment of the mA/kV was utilized to reduce the radiation dose to as low as reasonably achievable. COMPARISON: None available. CLINICAL HISTORY: Neck trauma (Age >= 65y). [place] -  unwitnessed. Abrasion to right elbow, left hand with swelling. Bump to left side of forehead. FINDINGS: CERVICAL SPINE: BONES AND ALIGNMENT: Straightening of the normal cervical lordosis is present. No acute fracture or traumatic malalignment. DEGENERATIVE CHANGES: Mild degenerative changes are present throughout the cervical spine. Anus formation is noted about the dens without significant central canal stenosis. SOFT TISSUES: No prevertebral soft tissue swelling. VASCULATURE: Atherosclerotic calcifications are present at the aortic arch and great vessel origins. Dense calcifications are present at the carotid bifurcations bilaterally with probable left sided stenosis. IMPRESSION: 1. No acute abnormality of the cervical spine related to the reported neck trauma. 2. Atherosclerotic calcifications at the carotid bifurcations with probable left-sided stenosis; recommend dedicated vascular evaluation as clinically indicated. Electronically signed by: Lonni Necessary MD 06/02/2024 11:22 AM EDT RP Workstation: [id number]   CT HEAD WO CONTRAST Result Date: 06/02/2024 EXAM: CT HEAD WITHOUT CONTRAST 06/02/2024 11:12:15 AM TECHNIQUE: CT of the head was performed without the administration of intravenous contrast. Automated exposure control, iterative reconstruction, and/or weight based adjustment of the mA/kV was utilized to reduce the radiation dose to as low as reasonably achievable. COMPARISON: None available. CLINICAL HISTORY: Head trauma, minor (Age >= 65y). [place] - unwitnessed. Abrasion to right elbow, left hand with swelling. Bump to left side of forehead. FINDINGS: BRAIN AND VENTRICLES: No acute hemorrhage. No evidence of acute infarct. No hydrocephalus. No extra-axial collection. No mass effect or midline shift. Moderate atrophy and white matter changes are present bilaterally. This most likely reflects the sequelae of chronic microvascular ischemia. Atherosclerotic calcifications are present in the  cavernous carotid arteries bilaterally and at the dural margin of both vertebral arteries. No hyperdense vessel is present. ORBITS: Bilateral lens replacements are noted. The globes and orbits are otherwise within normal limits. SINUSES: No acute abnormality. SOFT TISSUES AND SKULL: A large left frontal scalp hematoma and laceration is present. No underlying fracture or foreign body is present. IMPRESSION: 1. No acute intracranial abnormality. 2. Large left frontal scalp hematoma and laceration  without underlying fracture or foreign body. Electronically signed by: Lonni Necessary MD 06/02/2024 11:20 AM EDT RP Workstation: [id number]   Assessment/Plan Catherine Andrews is a 89 y.o. female with medical history significant for insulin -dependent type 2 diabetes, hypertension, metastatic lung cancer on chemotherapy, paroxysmal atrial fibrillation on aspirin  recently admitted to the hospital with hypoxic respiratory failure due to COVID-pneumonia and community-acquired pneumonia now being admitted to the hospital after a fall and found to have hypoglycemia.   Hypoglycemia, insulin -dependent type 2 diabetes-I suspect this is the reason for her syncope and fall today.  She took oral steroids for 3 more days after her last hospital discharge, I suspect she needs to be on a lower dose of her insulin  now that she is off of steroids.  Hemoglobin A1c was also noted to be lower than goal for her age.  On review of documentation, she last received Humalog last evening. -Observation admission -Carb modified diet -Will plan to resume long-acting insulin  at half dose tomorrow morning -Moderate dose sliding scale in the meantime  Paroxysmal atrial fibrillation-continue ASA  Hypertension-continue home medications including amlodipine , losartan , hydralazine   GERD-omeprazole  CKD stage IIIb-renal function appears to be at baseline  Hyperlipidemia-continue home statin  Depression-Wellbutrin   DVT prophylaxis:  Subcutaneous heparin     Code Status: Limited: Do not attempt resuscitation (DNR) -DNR-LIMITED -Do Not Intubate/DNI   Consults called: None  Admission status: Observation  Time spent: 49 minutes  Carolynne Schuchard CHRISTELLA Gail MD [facility] Pager [phone]  If 7PM-7AM, please contact night-coverage [email or web address] Password [facility]  06/02/2024, 2:36 PM

## 2024-06-03 DIAGNOSIS — E162 Hypoglycemia, unspecified: Secondary | ICD-10-CM | POA: Diagnosis not present

## 2024-06-03 DIAGNOSIS — E11649 Type 2 diabetes mellitus with hypoglycemia without coma: Secondary | ICD-10-CM | POA: Diagnosis not present

## 2024-06-03 LAB — CBC
HCT: 34.5 % — ABNORMAL LOW (ref 36.0–46.0)
Hemoglobin: 10.5 g/dL — ABNORMAL LOW (ref 12.0–15.0)
MCH: 31.8 pg (ref 26.0–34.0)
MCHC: 30.4 g/dL (ref 30.0–36.0)
MCV: 104.5 fL — ABNORMAL HIGH (ref 80.0–100.0)
Platelets: 158 K/uL (ref 150–400)
RBC: 3.3 MIL/uL — ABNORMAL LOW (ref 3.87–5.11)
RDW: 15.1 % (ref 11.5–15.5)
WBC: 5.8 K/uL (ref 4.0–10.5)
nRBC: 0 % (ref 0.0–0.2)

## 2024-06-03 LAB — BASIC METABOLIC PANEL WITH GFR
Anion gap: 12 (ref 5–15)
BUN: 40 mg/dL — ABNORMAL HIGH (ref 8–23)
CO2: 20 mmol/L — ABNORMAL LOW (ref 22–32)
Calcium: 8.6 mg/dL — ABNORMAL LOW (ref 8.9–10.3)
Chloride: 107 mmol/L (ref 98–111)
Creatinine, Ser: 1.36 mg/dL — ABNORMAL HIGH (ref 0.44–1.00)
GFR, Estimated: 38 mL/min — ABNORMAL LOW (ref >60)
Glucose, Bld: 188 mg/dL — ABNORMAL HIGH (ref 70–99)
Potassium: 4.2 mmol/L (ref 3.5–5.1)
Sodium: 139 mmol/L (ref 135–145)

## 2024-06-03 LAB — GLUCOSE, CAPILLARY
Glucose-Capillary: 206 mg/dL — ABNORMAL HIGH (ref 70–99)
Glucose-Capillary: 244 mg/dL — ABNORMAL HIGH (ref 70–99)

## 2024-06-03 MED ORDER — INSULIN GLARGINE 100 UNIT/ML ~~LOC~~ SOLN: 20.0000 [IU] | Freq: Every day | SUBCUTANEOUS | Status: DC

## 2024-06-03 MED ORDER — INSULIN ASPART 100 UNIT/ML IJ SOLN: 0.0000 [IU] | Freq: Three times a day (TID) | INTRAMUSCULAR | Status: AC

## 2024-06-03 NOTE — Progress Notes (Addendum)
 Discharge instructions given to family, PTAR to come soon. Attempted to call report to nurse taking room 505 at facility twice.

## 2024-06-03 NOTE — Plan of Care (Signed)

## 2024-06-03 NOTE — Discharge Summary (Signed)
 Physician Discharge Summary   Patient: Catherine Andrews MRN: [id number] DOB: [birth year]  Admit date:     06/02/2024  Discharge date: 06/03/24  Discharge Physician: Delon Herald   PCP: Fleeta Valeria Mayo, MD   Recommendations at discharge:   You are returning to short-term rehabilitation at [facility] Decrease Lantus  (long-acting insulin ) to 20 units daily Decrease sliding scale coverage as ordered Follow up next week with Dr. Fleeta Valeria  Discharge Diagnoses: Principal Problem:   Hypoglycemia Active Problems:   Hyperlipidemia   Presence of aortocoronary bypass graft   Stage 3b chronic kidney disease (HCC)   PAD (peripheral artery disease)   Essential hypertension   Atrial fibrillation (HCC)   Diabetic nephropathy associated with type 2 diabetes mellitus [facility])    Hospital Course: 89yo with h/o IDDM, HTN, metastatic lung CA on chemotherapy, and PAF on oxygen with recent admission for COVID (dc back to ILF at [facility] on 9/18) who presented with a fall.  She was found to have hypoglycemia.  Observed overnight with resolution of hypoglycemia.  Assessment and Plan:  Hypoglycemia, insulin -dependent type 2 diabetes Recent hospitalization for COVID, on steroids, had increased dosing of insulin  Presented with a fall at home that is thought to be related to hypoglycemia Last A1c was 6.1, with goal <8 for her age On review of documentation, she last received Humalog on the evening prior to admission Observation overnight Carb modified diet Will plan to resume long-acting insulin  at half dose  Will also decrease SSI scale   Paroxysmal atrial fibrillation Continue ASA   Hypertension Continue home medications including amlodipine , losartan , hydralazine    GERD Continue omeprazole   CKD stage IIIb Appears to be stable at this time Attempt to avoid nephrotoxic medications Continue every other day Lasix  dosing Continue Farxiga    Hyperlipidemia Continue home statin  PAD S/p B  femoral to popliteal bypass grafts Last US  in 04/2024 with 50-75% stenosis at R proximal anastomosis Followed by Dr. Gretta   Depression Continue Wellbutrin , venlafaxine , trazodone   DNR DNR confirmed at the time of admission [facility] documents reviewed Patient will need a gold out of facility DNR form at the time of discharge - signed and on chart    Consultants: DM coordinator  Procedures: None  Antibiotics: None     Pain control - [place]  Controlled Substance Reporting System database was reviewed. and patient was instructed, not to drive, operate heavy machinery, perform activities at heights, swimming or participation in water activities or provide baby-sitting services while on Pain, Sleep and Anxiety Medications; until their outpatient Physician has advised to do so again. Also recommended to not to take more than prescribed Pain, Sleep and Anxiety Medications.   Disposition: Skilled nursing facility Diet recommendation:  Carb modified diet DISCHARGE MEDICATION: Allergies as of 06/03/2024       Reactions   Codeine Other (See Comments)    HALLUCINATIONS   Latex Itching, Hives, Rash   Per patient break outs adhesives   Losartan  Other (See Comments)   decline gfr   Metformin Hcl Diarrhea        Medication List     STOP taking these medications    Admelog SoloStar 100 UNIT/ML KwikPen Generic drug: insulin  lispro   Basaglar  KwikPen 100 UNIT/ML Replaced by: insulin  glargine 100 UNIT/ML injection       TAKE these medications    acetaminophen  325 MG tablet Commonly known as: TYLENOL  Take 325 mg by mouth every 6 (six) hours as needed for moderate pain.   amLODipine   10 MG tablet Commonly known as: NORVASC  TAKE 1 TABLET BY MOUTH EVERY EVENING FOR BLOOD PRESSURE   aspirin  81 MG chewable tablet Chew 81 mg by mouth every evening.   atorvastatin  10 MG tablet Commonly known as: LIPITOR Take 2 tablets (20 mg total) by mouth daily. for cholesterol.    buPROPion  150 MG 24 hr tablet Commonly known as: WELLBUTRIN  XL Take 1 tablet (150 mg total) by mouth daily.   Cholecalciferol  125 MCG (5000 UT) Tabs Take 1 tablet (5,000 Units total) by mouth 3 (three) times a week.   Farxiga  10 MG Tabs tablet Generic drug: dapagliflozin  propanediol TAKE 1 TABLET BY MOUTH EVERY MORNING   fluticasone  50 MCG/ACT nasal spray Commonly known as: FLONASE  Place 1 spray into both nostrils in the morning and at bedtime.   furosemide  40 MG tablet Commonly known as: LASIX  TAKE 1 TABLET BY MOUTH EVERY OTHER DAY WITH POTASSIUM   guaiFENesin -dextromethorphan  100-10 MG/5ML syrup Commonly known as: ROBITUSSIN DM Take 5 mLs by mouth every 4 (four) hours as needed for cough (chest congestion).   hydrALAZINE  50 MG tablet Commonly known as: APRESOLINE  TAKE 1 TABLET BY MOUTH THREE TIMES A DAY WITH FOOD   insulin  aspart 100 UNIT/ML injection Commonly known as: novoLOG  Inject 0-15 Units into the skin 3 (three) times daily with meals.   insulin  glargine 100 UNIT/ML injection Commonly known as: LANTUS  Inject 0.2 mLs (20 Units total) into the skin daily. Start taking on: June 04, 2024 Replaces: Basaglar  KwikPen 100 UNIT/ML   losartan  100 MG tablet Commonly known as: COZAAR  Take 1 tablet (100 mg total) by mouth daily.   metoprolol  succinate 50 MG 24 hr tablet Commonly known as: TOPROL -XL TAKE 1 & 1/2 TABLET = 75 MG BY MOUTH ONCE DAILY   omeprazole 20 MG capsule Commonly known as: PRILOSEC TAKE 1 CAPSULE BY MOUTH EVERY MORNING, 30 MINUTES BEFORE MORNING MEAL *DO NOT CRUSH OR CHEW*   potassium chloride  10 MEQ tablet Commonly known as: KLOR-CON  M TAKE 1 TABLET BY MOUTH EVERY OTHER DAY WITH LASIX  *TAKE WITH FOOD* *DO NOT CRUSH OR CHEW* *MAY DISSOLVE*   PreserVision AREDS Caps Take 1 capsule by mouth in the morning and at bedtime.   traZODone  50 MG tablet Commonly known as: DESYREL  TAKE 1/2 TABLET= 25 MG BY MOUTH EVERY NIGHT AT BEDTIME    venlafaxine  XR 75 MG 24 hr capsule Commonly known as: EFFEXOR -XR TAKE 1 CAPSULE BY MOUTH ONCE DAILY *TAKE WITH FOOD* *DO NOT CRUSH OR CHEW*   VITAMIN B 12 PO Take 1 tablet by mouth daily.        Discharge Exam:   Subjective: Feeling well.  Does not remember what happened.  Wants to go back and complete STR.   Objective: Vitals:   06/03/24 0516 06/03/24 0801  BP: (!) 142/43 (!) 173/49  Pulse: 64 65  Resp: 16 17  Temp: (!) 97.4 F (36.3 C)   SpO2: 98% 94%    Intake/Output Summary (Last 24 hours) at 06/03/2024 1120 Last data filed at 06/03/2024 0730 Gross per 24 hour  Intake --  Output 500 ml  Net -500 ml   Filed Weights   06/02/24 2057  Weight: 74.8 kg    Exam:  General:  Appears calm and comfortable and is in NAD Eyes:  L > R periorbital ecchymosis ENT:   hard of hearing, grossly normal lips & tongue, mmm; poor dentition Cardiovascular:  RRR. No LE edema.  Respiratory:   CTA bilaterally with no wheezes/rales/rhonchi.  Normal respiratory effort. Abdomen:  soft, NT, ND Skin:  no rash or induration seen on limited exam, scattered ecchymoses Musculoskeletal:  L foot contracture with L > R LE atrophy Psychiatric:  grossly normal mood and affect, speech fluent and appropriate, AOx3 Neurologic:  CN 2-12 grossly intact, moves all extremities in coordinated fashion  Data Reviewed: I have reviewed the patient's lab results since admission.  Pertinent labs for today include:   CO2 20, not clinically significant Glucose 55, 70, 181, 222, 188, 206 BUN 40/Creatinine 1.36/GFR 38, stable WBC 5.8 Hgb 10.5    Condition at discharge: improving  The results of significant diagnostics from this hospitalization (including imaging, microbiology, ancillary and laboratory) are listed below for reference.   Imaging Studies: DG Hand Complete Right Result Date: 06/02/2024 EXAM: 3 OR MORE VIEW(S) XRAY OF THE RIGHT HAND 06/02/2024 10:39:00 AM COMPARISON: None available.  CLINICAL HISTORY: Syncope. Abrasion to right elbow. FINDINGS: BONES AND JOINTS: I don't see a well-defined underlying fracture. Absent triquetrum; correlate with operative history. Marginal erosions in the head of the middle phalanx index finger, potentially from gout or erosive arthropathy. Spurring at the base of the first metacarpal with some faint calcifications between the distal pole of the scaphoid and the first metacarpal. No joint dislocation. SOFT TISSUES: Prominent soft tissue swelling dorsal to the distal carpal row and carpometacarpal articulation. IMPRESSION: 1. Prominent dorsal soft tissue swelling at the distal carpal row and carpometacarpal articulation without a definite fracture. 2. Marginal erosions at the head of the index finger middle phalanx, suggestive of gout or erosive arthropathy. 3. Degenerative changes with spurring at the base of the first metacarpal and faint calcifications between the distal scaphoid and first metacarpal. 4. Absent triquetrum. Electronically signed by: Ryan Salvage MD 06/02/2024 11:36 AM EDT RP Workstation: [id number]   DG Pelvis 1-2 Views Result Date: 06/02/2024 EXAM: 1 or 2 VIEW(S) XRAY OF THE PELVIS 06/02/2024 10:39:00 AM COMPARISON: None available. CLINICAL HISTORY: Syncope, low CBG, and presentation with a c-collar via EMS. FINDINGS: BONES AND JOINTS: Lower lumbar spondylosis and degenerative disc disease. Mild bony demineralization. No fracture or acute bony findings identified. Proximal iliac stents noted. Left femoral artery stent noted. Atherosclerosis is present. SOFT TISSUES: The soft tissues are unremarkable. IMPRESSION: 1. No acute bony abnormality. 2. Lower lumbar spondylosis and degenerative disc disease. 3. Atherosclerosis with proximal iliac and left femoral artery stents. Electronically signed by: Ryan Salvage MD 06/02/2024 11:31 AM EDT RP Workstation: [id number]   DG Chest Port 1 View Result Date: 06/02/2024 EXAM: 1 VIEW(S) XRAY OF  THE CHEST 06/02/2024 10:39:00 AM COMPARISON: None available. CLINICAL HISTORY: Syncope, unwitnessed. Hypoglycemia (CBG 33, improved to 77 after oral glucose). Presented with c-collar. FINDINGS: LUNGS AND PLEURA: Low lung volumes are present, causing crowding of the pulmonary vasculature. Previous hazy opacity at the right lung base is no longer well appreciated. No pleural effusion. No pneumothorax. No pulmonary edema. HEART AND MEDIASTINUM: Atheromatous vascular calcification of the aortic arch. No acute abnormality of the cardiac and mediastinal silhouettes. BONES AND SOFT TISSUES: Thoracic spondylosis noted. Benign-appearing calcification in the right breast as shown on mammography. IMPRESSION: 1. No acute cardiopulmonary process. 2. Low lung volumes causing vascular crowding. 3. Aortic arch atherosclerotic calcification. 4. Thoracic spondylosis. 5. Benign-appearing right breast calcification. Electronically signed by: Ryan Salvage MD 06/02/2024 11:29 AM EDT RP Workstation: [id number]   CT CERVICAL SPINE WO CONTRAST Result Date: 06/02/2024 EXAM: CT CERVICAL SPINE WITHOUT CONTRAST 06/02/2024 11:12:15 AM TECHNIQUE: CT of the cervical spine was performed without  the administration of intravenous contrast. Multiplanar reformatted images are provided for review. Automated exposure control, iterative reconstruction, and/or weight based adjustment of the mA/kV was utilized to reduce the radiation dose to as low as reasonably achievable. COMPARISON: None available. CLINICAL HISTORY: Neck trauma (Age >= 65y). [place] - unwitnessed. Abrasion to right elbow, left hand with swelling. Bump to left side of forehead. FINDINGS: CERVICAL SPINE: BONES AND ALIGNMENT: Straightening of the normal cervical lordosis is present. No acute fracture or traumatic malalignment. DEGENERATIVE CHANGES: Mild degenerative changes are present throughout the cervical spine. Anus formation is noted about the dens without significant  central canal stenosis. SOFT TISSUES: No prevertebral soft tissue swelling. VASCULATURE: Atherosclerotic calcifications are present at the aortic arch and great vessel origins. Dense calcifications are present at the carotid bifurcations bilaterally with probable left sided stenosis. IMPRESSION: 1. No acute abnormality of the cervical spine related to the reported neck trauma. 2. Atherosclerotic calcifications at the carotid bifurcations with probable left-sided stenosis; recommend dedicated vascular evaluation as clinically indicated. Electronically signed by: Lonni Necessary MD 06/02/2024 11:22 AM EDT RP Workstation: [id number]   CT HEAD WO CONTRAST Result Date: 06/02/2024 EXAM: CT HEAD WITHOUT CONTRAST 06/02/2024 11:12:15 AM TECHNIQUE: CT of the head was performed without the administration of intravenous contrast. Automated exposure control, iterative reconstruction, and/or weight based adjustment of the mA/kV was utilized to reduce the radiation dose to as low as reasonably achievable. COMPARISON: None available. CLINICAL HISTORY: Head trauma, minor (Age >= 65y). [place] - unwitnessed. Abrasion to right elbow, left hand with swelling. Bump to left side of forehead. FINDINGS: BRAIN AND VENTRICLES: No acute hemorrhage. No evidence of acute infarct. No hydrocephalus. No extra-axial collection. No mass effect or midline shift. Moderate atrophy and white matter changes are present bilaterally. This most likely reflects the sequelae of chronic microvascular ischemia. Atherosclerotic calcifications are present in the cavernous carotid arteries bilaterally and at the dural margin of both vertebral arteries. No hyperdense vessel is present. ORBITS: Bilateral lens replacements are noted. The globes and orbits are otherwise within normal limits. SINUSES: No acute abnormality. SOFT TISSUES AND SKULL: A large left frontal scalp hematoma and laceration is present. No underlying fracture or foreign body is present.  IMPRESSION: 1. No acute intracranial abnormality. 2. Large left frontal scalp hematoma and laceration without underlying fracture or foreign body. Electronically signed by: Lonni Necessary MD 06/02/2024 11:20 AM EDT RP Workstation: [id number]   DG Chest Portable 1 View Result Date: 05/19/2024 CLINICAL DATA:  Shortness of breath. EXAM: PORTABLE CHEST 1 VIEW COMPARISON:  November 07, 2020. FINDINGS: Stable cardiomediastinal silhouette. Left lung is clear. Increased right basilar opacity is noted concerning for pneumonia or atelectasis. Small right pleural effusion may be present. Bony thorax is unremarkable. Sternotomy wires are noted. IMPRESSION: Increased right basilar opacity is noted concerning for pneumonia or atelectasis. Small right pleural effusion may be present. Followup PA and lateral chest X-ray is recommended in 3-4 weeks following trial of antibiotic therapy to ensure resolution and exclude underlying malignancy. Electronically Signed   By: Lynwood Landy Raddle M.D.   On: 05/19/2024 12:08    Microbiology: Results for orders placed or performed during the hospital encounter of 05/19/24  Resp panel by RT-PCR (RSV, Flu A&B, Covid) Anterior Nasal Swab     Status: Abnormal   Collection Time: 05/19/24 11:25 AM   Specimen: Anterior Nasal Swab  Result Value Ref Range Status   SARS Coronavirus 2 by RT PCR POSITIVE (A) NEGATIVE Final    Comment: (NOTE) SARS-CoV-2  target nucleic acids are DETECTED.  The SARS-CoV-2 RNA is generally detectable in upper respiratory specimens during the acute phase of infection. Positive results are indicative of the presence of the identified virus, but do not rule out bacterial infection or co-infection with other pathogens not detected by the test. Clinical correlation with patient history and other diagnostic information is necessary to determine patient infection status. The expected result is Negative.  Fact Sheet for  Patients: BloggerCourse.com  Fact Sheet for Healthcare Providers: SeriousBroker.it  This test is not yet approved or cleared by the [place]  FDA and  has been authorized for detection and/or diagnosis of SARS-CoV-2 by FDA under an Emergency Use Authorization (EUA).  This EUA will remain in effect (meaning this test can be used) for the duration of  the COVID-19 declaration under Section 564(b)(1) of the A ct, 21 U.S.C. section 360bbb-3(b)(1), unless the authorization is terminated or revoked sooner.     Influenza A by PCR NEGATIVE NEGATIVE Final   Influenza B by PCR NEGATIVE NEGATIVE Final    Comment: (NOTE) The Xpert Xpress SARS-CoV-2/FLU/RSV plus assay is intended as an aid in the diagnosis of influenza from Nasopharyngeal swab specimens and should not be used as a sole basis for treatment. Nasal washings and aspirates are unacceptable for Xpert Xpress SARS-CoV-2/FLU/RSV testing.  Fact Sheet for Patients: BloggerCourse.com  Fact Sheet for Healthcare Providers: SeriousBroker.it  This test is not yet approved or cleared by the [place]  FDA and has been authorized for detection and/or diagnosis of SARS-CoV-2 by FDA under an Emergency Use Authorization (EUA). This EUA will remain in effect (meaning this test can be used) for the duration of the COVID-19 declaration under Section 564(b)(1) of the Act, 21 U.S.C. section 360bbb-3(b)(1), unless the authorization is terminated or revoked.     Resp Syncytial Virus by PCR NEGATIVE NEGATIVE Final    Comment: (NOTE) Fact Sheet for Patients: BloggerCourse.com  Fact Sheet for Healthcare Providers: SeriousBroker.it  This test is not yet approved or cleared by the [place]  FDA and has been authorized for detection and/or diagnosis of SARS-CoV-2 by FDA under an Emergency Use  Authorization (EUA). This EUA will remain in effect (meaning this test can be used) for the duration of the COVID-19 declaration under Section 564(b)(1) of the Act, 21 U.S.C. section 360bbb-3(b)(1), unless the authorization is terminated or revoked.  Performed at [facility], [address], [place], [place] [address]     Labs: CBC: Recent Labs  Lab 06/02/24 0957 06/03/24 0546  WBC 15.3* 5.8  NEUTROABS 12.1*  --   HGB 12.9 10.5*  HCT 42.4 34.5*  MCV 104.4* 104.5*  PLT 215 158   Basic Metabolic Panel: Recent Labs  Lab 06/02/24 0957 06/03/24 0546  NA 143 139  K 3.8 4.2  CL 109 107  CO2 21* 20*  GLUCOSE 55* 188*  BUN 43* 40*  CREATININE 1.53* 1.36*  CALCIUM  9.4 8.6*   Liver Function Tests: Recent Labs  Lab 06/02/24 0957  AST 26  ALT 38  ALKPHOS 92  BILITOT 0.5  PROT 7.3  ALBUMIN 4.0   CBG: Recent Labs  Lab 06/02/24 0940 06/02/24 1316 06/02/24 1712 06/02/24 2147 06/03/24 0757  GLUCAP 74 70 181* 222* 206*    Discharge time spent: greater than 30 minutes.  Signed: Delon Herald, MD [facility] 06/03/2024

## 2024-06-03 NOTE — TOC Initial Note (Addendum)
 Transition of Care [facility]) - Initial/Assessment Note    Patient Details  Name: Catherine Andrews MRN: [id number] Date of Birth: 01/14/1935  Transition of Care [facility]) CM/SW Contact:    Sonda Manuella Quill, RN Phone Number: 06/03/2024, 12:20 PM  Clinical Narrative:                 TOC for d/c planning; spoke w/ pt and family in room; pt said she is from [place]; she plans to return at d/c; transport by [facility]; pt verified insurance/PCP; she denied SDOH risks; pt has scooter; she does not have HH services or home oxygen; spoke w/ [place] at facility; she gave RM # [phone], call report # [phone]; she also said only need D/C summary; [facility] called for transport at 1311; spoke Operator # [phone]; no TOC needs.  Expected Discharge Plan: Skilled Nursing Facility Barriers to Discharge: No Barriers Identified   Patient Goals and CMS Choice Patient states their goals for this hospitalization and ongoing recovery are:: return to [facility].gov Compare Post Acute Care list provided to:: Patient        Expected Discharge Plan and Services   Discharge Planning Services: CM Consult   Living arrangements for the past 2 months: Skilled Nursing Facility Expected Discharge Date: 06/03/24               DME Arranged: N/A DME Agency: NA       HH Arranged: NA HH Agency: NA        Prior Living Arrangements/Services Living arrangements for the past 2 months: Skilled Nursing Facility Lives with:: Facility Resident Patient language and need for interpreter reviewed:: Yes Do you feel safe going back to the place where you live?: Yes      Need for Family Participation in Patient Care: Yes (Comment) Care giver support system in place?: Yes (comment) Current home services: DME (scooter) Criminal Activity/Legal Involvement Pertinent to Current Situation/Hospitalization: No - Comment as needed  Activities of Daily Living   ADL Screening (condition at time of admission) Independently  performs ADLs?: No Does the patient have a NEW difficulty with bathing/dressing/toileting/self-feeding that is expected to last >3 days?: No Does the patient have a NEW difficulty with getting in/out of bed, walking, or climbing stairs that is expected to last >3 days?: No Does the patient have a NEW difficulty with communication that is expected to last >3 days?: No Is the patient deaf or have difficulty hearing?: Yes Does the patient have difficulty seeing, even when wearing glasses/contacts?: No Does the patient have difficulty concentrating, remembering, or making decisions?: Yes  Permission Sought/Granted Permission sought to share information with : Case Manager Permission granted to share information with : Yes, Verbal Permission Granted  Share Information with NAME: Case Manager     Permission granted to share info w Relationship: Leita Kilts (grand daughter) [phone]     Emotional Assessment Appearance:: Appears stated age Attitude/Demeanor/Rapport: Gracious Affect (typically observed): Accepting Orientation: : Oriented to Self, Oriented to Place, Oriented to  Time, Oriented to Situation Alcohol / Substance Use: Not Applicable Psych Involvement: No (comment)  Admission diagnosis:  Hypoglycemia [E16.2] Fall, initial encounter [W19.XXXA] Contusion of forehead, initial encounter [S00.83XA] Contusion of hand, unspecified laterality, initial encounter [S60.229A] Patient Active Problem List   Diagnosis Date Noted   Hypoglycemia 06/02/2024   Pneumonia due to COVID-19 virus 05/19/2024   Diabetic nephropathy associated with type 2 diabetes mellitus (HCC) 04/13/2024   Diabetes mellitus (HCC) 01/13/2024   Urinary tract infection without hematuria  12/02/2023   Atrial fibrillation (HCC) 07/15/2023   History of lung cancer 07/15/2023   History of bladder cancer 07/15/2023   BMI 29.0-29.9,adult 07/15/2023   Essential hypertension 10/08/2022   Stage 3 chronic kidney disease  (HCC) 10/08/2022   PAD (peripheral artery disease) 04/21/2022   Personal history of malignant neoplasm of breast 02/12/2021   Stage 3b chronic kidney disease (HCC) 02/12/2021   Hypocalcemia 11/07/2020   Acquired atrophy of thyroid  10/16/2020   Chronic diastolic congestive heart failure (HCC) 10/16/2020   Pain due to onychomycosis of toenails of both feet 10/16/2020   Pain in left foot 01/01/2020   Abnormal PET scan of colon 06/19/2019   Anemia 11/11/2017   Black stools 11/11/2017   Hyperthyroidism 10/26/2017   Chronic fatigue 09/22/2017   Lethargy 09/17/2017   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) 04/15/2017   Presence of arterial stent 04/23/2016   Left foot drop 01/31/2016   Wound of left leg 01/31/2016   Non-healing wound of lower extremity 01/17/2016   Cellulitis and abscess of leg 12/05/2015   Diabetic polyneuropathy associated with type 2 diabetes mellitus (HCC) 10/07/2015   Presence of aortocoronary bypass graft 07/28/2015   Gastroesophageal reflux disease 02/12/2015   Atrial fibrillation with RVR (HCC) 11/12/2014   Depression 11/12/2014   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) 11/12/2014   Hyperkalemia 11/22/2013   NSTEMI (non-ST elevated myocardial infarction) (HCC) 11/22/2013   Carotid bruit 07/20/2013   Shortness of breath 06/06/2013   Coronary artery disease 05/09/2013   Hyperlipidemia 05/09/2013   Atherosclerosis of native artery of extremity 03/28/2013   Intermittent claudication 03/28/2013   PCP:  Fleeta Valeria Mayo, MD Pharmacy:   [facility] - [place], [place] - [address] [place] [address] Phone: [phone] Fax: [phone]  [facility] - [place], [place] - [address] [place] [address] Phone: [phone] Fax: [phone]  [facility] - [place], [place] - [address] [place] [address] Phone: [phone] Fax:  [phone]     Social Drivers of Health (SDOH) Social History: SDOH Screenings   Food Insecurity: No Food Insecurity (06/03/2024)  Housing: Low Risk  (06/03/2024)  Transportation Needs: No Transportation Needs (06/03/2024)  Utilities: Not At Risk (06/03/2024)  Social Connections: Socially Isolated (06/02/2024)  Tobacco Use: Medium Risk (06/02/2024)   SDOH Interventions: Food Insecurity Interventions: Intervention Not Indicated, Inpatient TOC Housing Interventions: Intervention Not Indicated, Inpatient TOC Transportation Interventions: Intervention Not Indicated, Inpatient TOC Utilities Interventions: Intervention Not Indicated, Inpatient TOC   Readmission Risk Interventions     No data to display

## 2024-06-03 NOTE — Discharge Instructions (Signed)
 CBG 70 - 120: 0 units  CBG 121 - 150: 2 units  CBG 151 - 200: 3 units  CBG 201 - 250: 5 units  CBG 251 - 300: 8 units  CBG 301 - 350: 11 units  CBG 351 - 400: 15 units

## 2024-06-03 NOTE — Hospital Course (Signed)
 89yo with h/o IDDM, HTN, metastatic lung CA on chemotherapy, and PAF on oxygen with recent admission for COVID (dc back to ILF at [facility] on 9/18) who presented with a fall.  She was found to have hypoglycemia.  Observed overnight with resolution of hypoglycemia.

## 2024-06-27 ENCOUNTER — Other Ambulatory Visit: Payer: Self-pay

## 2024-06-27 ENCOUNTER — Other Ambulatory Visit: Payer: Self-pay | Admitting: Internal Medicine

## 2024-06-27 MED ORDER — BASAGLAR KWIKPEN 100 UNIT/ML ~~LOC~~ SOPN: 20.0000 [IU] | PEN_INJECTOR | Freq: Every day | SUBCUTANEOUS | 1 refills | Status: AC

## 2024-06-27 MED ORDER — INSULIN GLARGINE 100 UNIT/ML ~~LOC~~ SOLN: 20.0000 [IU] | Freq: Every day | SUBCUTANEOUS | 3 refills | Status: DC

## 2024-07-04 NOTE — Progress Notes (Unsigned)
 Cardiology Office Note   Date:  07/05/2024   ID:  Catherine Andrews, Catherine Andrews Aug 02, 1935, MRN [id number]  PCP:  Fleeta Valeria Mayo, MD  Cardiologist:   Vina Gull, MD   Pt presents for follow up of CAD   History of Present Illness: Catherine Andrews is a 89 y.o. female with a history of CAD  PAF, HL, PAD,(s/p L fem-pop bypass and LE stenting), non-small cell lung CA (s/p chemo; followed in [place]),DM and melanoma  Pt previously followed at [facility]  ([facility])   Moved to [place] around 2022/2023   Seen here for the first  time in 2023  Cardiac Hx   -March 2015 NSTEMI  LHC showed  60% LM; 90% LAD, 50% RCA, 80% d RCA  Pt underwent PCI to LAD   Then underwent CABG with LIMA to LAD -Nov 2015  LHC showed LIMA to apical LAD occluded   Pt underwent PTCA/DES x 2 to RCA    -Nov 2016  Pt underwent urgent redo CABG x 3  (Hx NSTEMI 2015;  s/p CABG x 2 (2015, 2016),  -2019  Pt reported to have Afib with RVR   Occurred in setting of GI flare and dehydration.   Not felt to be an anticoag candidate due ot hx of colitis and falling    Noted to be in afib there in clinic visits (however no EKG for review)  Since she has been seen here EKGs have shown SR    She wore a monitor in 2024 which showed SR  No Afib  She was last seen in cardiology by CHRISTELLA Pavy in 2024   Since seen the pt denies CP   Breathing is stable   She was admitted to hosp with COVID and then with weakness, fall      Denies palpitations       Current Meds  Medication Sig   acetaminophen  (TYLENOL ) 325 MG tablet Take 325 mg by mouth every 6 (six) hours as needed for moderate pain.   ADMELOG SOLOSTAR 100 UNIT/ML KwikPen INJECT 18 UNITS SUBQ FOR SUGAR 150, 22 UNITS FOR SUGAR 151-200, 26 UNITS FOR 201-250, 30 UNITS FOR 251-300 THREE TIMES A DAY *EXPIRES 28 DAYS AFTER OPENING*   amLODipine  (NORVASC ) 10 MG tablet TAKE 1 TABLET BY MOUTH EVERY EVENING FOR BLOOD PRESSURE   aspirin  81 MG chewable tablet Chew 81 mg by mouth every evening.   atorvastatin  (LIPITOR)  10 MG tablet Take 2 tablets (20 mg total) by mouth daily. for cholesterol.   buPROPion  (WELLBUTRIN  XL) 150 MG 24 hr tablet Take 1 tablet (150 mg total) by mouth daily.   Cholecalciferol  (VITAMIN D ) 50 MCG (2000 UT) tablet Take 2,000 Units by mouth daily.   Cholecalciferol  125 MCG (5000 UT) TABS Take 1 tablet (5,000 Units total) by mouth 3 (three) times a week.   Cyanocobalamin (VITAMIN B 12 PO) Take 1 tablet by mouth daily.   faricimab-svoa (VABYSMO) 6 MG/0.05ML SOLN intravitreal injection 6 mg by Intravitreal route daily.   FARXIGA  10 MG TABS tablet TAKE 1 TABLET BY MOUTH EVERY MORNING   fluticasone  (FLONASE ) 50 MCG/ACT nasal spray Place 1 spray into both nostrils in the morning and at bedtime.   furosemide  (LASIX ) 40 MG tablet TAKE 1 TABLET BY MOUTH EVERY OTHER DAY WITH POTASSIUM   hydrALAZINE  (APRESOLINE ) 50 MG tablet TAKE 1 TABLET BY MOUTH THREE TIMES A DAY WITH FOOD   insulin  aspart (NOVOLOG ) 100 UNIT/ML injection Inject 0-15 Units into the skin  3 (three) times daily with meals.   Insulin  Glargine (BASAGLAR  KWIKPEN) 100 UNIT/ML Inject 20 Units into the skin daily.   losartan  (COZAAR ) 100 MG tablet TAKE 1 TABLET BY MOUTH ONCE DAILY   Magnesium  250 MG TABS Take by mouth daily.   metoprolol  succinate (TOPROL -XL) 50 MG 24 hr tablet TAKE 1 & 1/2 TABLET = 75 MG BY MOUTH ONCE DAILY   Multiple Vitamins-Minerals (PRESERVISION AREDS) CAPS Take 1 capsule by mouth in the morning and at bedtime.   omeprazole (PRILOSEC) 20 MG capsule TAKE 1 CAPSULE BY MOUTH EVERY MORNING, 30 MINUTES BEFORE MORNING MEAL *DO NOT CRUSH OR CHEW*   potassium chloride  (KLOR-CON  M) 10 MEQ tablet TAKE 1 TABLET BY MOUTH EVERY OTHER DAY WITH LASIX  *TAKE WITH FOOD* *DO NOT CRUSH OR CHEW* *MAY DISSOLVE*   traZODone  (DESYREL ) 50 MG tablet TAKE 1/2 TABLET= 25 MG BY MOUTH EVERY NIGHT AT BEDTIME   venlafaxine  XR (EFFEXOR -XR) 75 MG 24 hr capsule TAKE 1 CAPSULE BY MOUTH ONCE DAILY *TAKE WITH FOOD* *DO NOT CRUSH OR CHEW*     Allergies:    Codeine, Latex, Losartan , and Metformin hcl   Past Medical History:  Diagnosis Date   Anemia    Anesthesia complication    Arthritis    Breast cancer (HCC)    Right Breast- 20 yrs ago   CAD (coronary artery disease)    CHF (congestive heart failure) (HCC)    Colon polyp    Constipation    Depression    Esophageal reflux    Fever    Foot drop    High cholesterol    HTN (hypertension)    Hyperlipemia    Hypertension    Lung cancer (HCC)    Melanoma (HCC)    MI (myocardial infarction) (HCC)    Paroxysmal atrial fibrillation (HCC)    Peripheral arterial disease    Skin ulcer of chin    SOB (shortness of breath)    Syncope and collapse    Uses hearing aid    Vertigo    Wears partial dentures     Past Surgical History:  Procedure Laterality Date   ABDOMINAL HYSTERECTOMY     BREAST BIOPSY     BREAST CYST EXCISION     BREAST LUMPECTOMY Right    20 yrs ago   cardiac stents     CARDIAC SURGERY     CATARACT EXTRACTION       Social History:  The patient  reports that she has quit smoking. She has never used smokeless tobacco. She reports current alcohol use of about 2.0 standard drinks of alcohol per week. She reports that she does not use drugs.   Family History:  The patient's family history includes Diabetes in her brother, father, maternal grandmother, mother, paternal grandfather, sister, and son; Heart failure in her father; Hyperlipidemia in her sister; Hypertension in an other family member; Kidney disease in her mother; Lymphoma in her brother.    ROS:  Please see the history of present illness. All other systems are reviewed and  Negative to the above problem except as noted.    PHYSICAL EXAM: VS:  BP (!) 150/50 (BP Location: Left Arm, Patient Position: Sitting, Cuff Size: Normal)   Ht 5' 5 (1.651 m)   Wt 162 lb 4.8 oz (73.6 kg)   SpO2 96%   BMI 27.01 kg/m   GEN: Pt is an 89 yo in NAD   Examined in wheelchair HEENT: normal  Neck: JVP is normal  R  carotid bruit Cardiac: RRR; no murmurs.  N Respiratory:  clear to auscultation  Moving air   GI: soft, nontender, No hepatomegaly  MS: L foot deformed   No LE edema   EKG:  EKG is not done today    Lipid Panel    Component Value Date/Time   CHOL 141 01/13/2024 1350   TRIG 171 (H) 01/13/2024 1350   HDL 54 01/13/2024 1350   CHOLHDL 2.4 10/08/2022 1414   LDLCALC 59 01/13/2024 1350      Wt Readings from Last 3 Encounters:  07/05/24 162 lb 4.8 oz (73.6 kg)  06/02/24 164 lb 14.5 oz (74.8 kg)  05/25/24 165 lb 9.1 oz (75.1 kg)      ASSESSMENT AND PLAN:  1  CAD   Pt with hx CAD   NSTEMI 2015  PTCA LAD and RCA   CABG x 2, last in 2016   Pt denies anginal symptoms   Follow   Rx risk factors    2  HTN   BP is labile   I told her if SBP over 170 to take an additional 1/2 hydralazine     Follow   3  Hx systolic CHF   Last echo at [facility] in 2018, LVEF was normal   Volume remains good    4  HL  LDL 59  HDL 54  Trig 171    Follow  Keep on statin   5  PAD   Denies claudication (s/p R fem-pop bypass, s/p PTA, PCI L FA, PTA L CF, stent L SFA, bilateral iliac stenting  Has seen C Clark at [facility]  6  Hx adeno CA lung   Followed in [place] with oncology       No evid of progressive metastatic dz.    7  Hx melanoma   s/p excision in 2018   (L shoulder)  8  DM   Last A1C 6.1     Follow up in May 2026  Current medicines are reviewed at length with the patient today.  The patient does not have concerns regarding medicines.  Signed, Vina Gull, MD  07/05/2024 10:50 AM

## 2024-07-05 ENCOUNTER — Encounter: Payer: Self-pay | Admitting: Internal Medicine

## 2024-07-05 ENCOUNTER — Ambulatory Visit: Attending: Internal Medicine | Admitting: Internal Medicine

## 2024-07-05 VITALS — BP 150/50 | Ht 65.0 in | Wt 162.3 lb

## 2024-07-05 DIAGNOSIS — I2510 Atherosclerotic heart disease of native coronary artery without angina pectoris: Secondary | ICD-10-CM | POA: Insufficient documentation

## 2024-07-05 NOTE — Patient Instructions (Signed)
 Medication Instructions:  Your physician recommends that you continue on your current medications as directed. Please refer to the Current Medication list given to you today.  *If you need a refill on your cardiac medications before your next appointment, please call your pharmacy*  Follow-Up: At [facility], you and your health needs are our priority.  As part of our continuing mission to provide you with exceptional heart care, our providers are all part of one team.  This team includes your primary Cardiologist (physician) and Advanced Practice Providers or APPs (Physician Assistants and Nurse Practitioners) who all work together to provide you with the care you need, when you need it.  Your next appointment:   6-7 month(s)  Provider:   Vina Gull, MD   We recommend signing up for the patient portal called MyChart.  Sign up information is provided on this After Visit Summary.  MyChart is used to connect with patients for Virtual Visits (Telemedicine).  Patients are able to view lab/test results, encounter notes, upcoming appointments, etc.  Non-urgent messages can be sent to your provider as well.    To learn more about what you can do with MyChart, go to [email or web address].

## 2024-07-20 ENCOUNTER — Ambulatory Visit: Admitting: Internal Medicine

## 2024-07-20 ENCOUNTER — Encounter: Payer: Self-pay | Admitting: Internal Medicine

## 2024-07-20 ENCOUNTER — Other Ambulatory Visit: Payer: Self-pay | Admitting: Internal Medicine

## 2024-07-20 VITALS — BP 126/70 | HR 67 | Temp 98.2°F | Resp 18 | Ht 65.0 in | Wt 157.1 lb

## 2024-07-20 DIAGNOSIS — F330 Major depressive disorder, recurrent, mild: Secondary | ICD-10-CM

## 2024-07-20 DIAGNOSIS — I4891 Unspecified atrial fibrillation: Secondary | ICD-10-CM

## 2024-07-20 DIAGNOSIS — E039 Hypothyroidism, unspecified: Secondary | ICD-10-CM

## 2024-07-20 DIAGNOSIS — E1159 Type 2 diabetes mellitus with other circulatory complications: Secondary | ICD-10-CM | POA: Diagnosis not present

## 2024-07-20 DIAGNOSIS — I5032 Chronic diastolic (congestive) heart failure: Secondary | ICD-10-CM

## 2024-07-20 DIAGNOSIS — Z85118 Personal history of other malignant neoplasm of bronchus and lung: Secondary | ICD-10-CM

## 2024-07-20 DIAGNOSIS — E1121 Type 2 diabetes mellitus with diabetic nephropathy: Secondary | ICD-10-CM

## 2024-07-20 DIAGNOSIS — Z8551 Personal history of malignant neoplasm of bladder: Secondary | ICD-10-CM

## 2024-07-20 DIAGNOSIS — I739 Peripheral vascular disease, unspecified: Secondary | ICD-10-CM

## 2024-07-20 DIAGNOSIS — K219 Gastro-esophageal reflux disease without esophagitis: Secondary | ICD-10-CM

## 2024-07-20 DIAGNOSIS — Z6826 Body mass index (BMI) 26.0-26.9, adult: Secondary | ICD-10-CM

## 2024-07-20 DIAGNOSIS — I70203 Unspecified atherosclerosis of native arteries of extremities, bilateral legs: Secondary | ICD-10-CM

## 2024-07-20 DIAGNOSIS — E782 Mixed hyperlipidemia: Secondary | ICD-10-CM

## 2024-07-20 DIAGNOSIS — N1832 Chronic kidney disease, stage 3b: Secondary | ICD-10-CM | POA: Diagnosis not present

## 2024-07-20 DIAGNOSIS — I10 Essential (primary) hypertension: Secondary | ICD-10-CM

## 2024-07-20 DIAGNOSIS — E1142 Type 2 diabetes mellitus with diabetic polyneuropathy: Secondary | ICD-10-CM

## 2024-07-20 DIAGNOSIS — Z794 Long term (current) use of insulin: Secondary | ICD-10-CM

## 2024-07-20 DIAGNOSIS — R7989 Other specified abnormal findings of blood chemistry: Secondary | ICD-10-CM

## 2024-07-20 DIAGNOSIS — I25810 Atherosclerosis of coronary artery bypass graft(s) without angina pectoris: Secondary | ICD-10-CM

## 2024-07-20 NOTE — Assessment & Plan Note (Signed)
 Her last ECHO in 2018 showed a normal LVEF but she had grade I diastolic dysfunction.  She shows no signs of volume overload today.

## 2024-07-20 NOTE — Assessment & Plan Note (Signed)
 Her goal LDL <55 with her history of diabetes, PVD, and CAD.

## 2024-07-20 NOTE — Assessment & Plan Note (Signed)
 Her omeprazole controls her reflux.

## 2024-07-20 NOTE — Assessment & Plan Note (Signed)
 This is currently stable.  Her diabetic foot exam was normal.

## 2024-07-20 NOTE — Assessment & Plan Note (Signed)
 Plan as below.

## 2024-07-20 NOTE — Progress Notes (Signed)
 Preventive Screening-Counseling & Management     Catherine Andrews is a 89 y.o. female who presents for Medicare Annual/Subsequent preventive examination.  She currently lives in the independent living apartments at [facility].  Catherine Andrews is a 89 y.o. female who presents for Medicare Annual/Subsequent preventive examination.  She is currently at [facility] Independent living where she moved here in 11/2019.   The patient had a dilated eye exam done on 07/09/2023 done by Dr. McCuen at [facility] which there was no diabetic retinopathy.  They are following her for macular degeneration.  Her last digital screening mammogram was done on 08/26/2023 and this was normal.  Her last colonoscopy was in 06/2019 where they had done a surveillance PET in 06/06/2019 and the patient was found to have a any area of hypermetabolsm in the proximal right colon.  Her colonscopy on 06/29/2019 demonstrated a non-bleeding external and internal hemorrhoids and the entire colon appeared normal.  There was no endoscopic evidence of inflammation, mass or tumor in the entire colon. Her previous colonoscopy was done in 11/2017 demonstrated an ascending colon tubuar adenoma.  She had an EGD done in 11/2017 that showed a 3 cm hiatal hernia  and congestive gastropathy and normal duodenum.  She denies any urinary incontinence.  She has a history of tobacco abuse where she began smoking at age 68 where she was smoking 1ppd.  She quit smoking in 2008.  The patient does not exercise.  She does get yearly flu vaccines.  She had a prevnar 13 vaccine in 02/2015 and a pneumovax 23 vaccines on 11/2009 and again on 03/2012.  She had a shingrix vaccine done in 2023.  She had a RSV vaccine done in 2023.  She has had 6 COVID-19 vaccines including 4 boosters.  Today, she denies any depression, anxiety or memory loss.  She is on ASA 81mg  daily.    The patient is a 89 year old Caucasian/White female who returns for a follow-up visit for her  T2 diabetes. She was diagnosed with diabetes about 40 years ago.  On review of her records, she was admitted to [facility] on 06/03/2023 due to hypoglycemia where they cut back her dose of basalgar from 40 Units to 20 Units.  Over the interim, she did increase her basalgar to 24 units daily.  She uses  ademlog SSI.  She takes around 18-24 Units of short acting sliding scale when she uses it.  She states she takes the sliding scale 3 times per day.  She has been on metformin in the past but this has caused diarrhea.  She is not walking as much as they would like. She specifically denies unexplained abdominal pain, nausea or vomiting and documented hypoglycemia.  She checks blood sugars with a Freestyle libre and she states her blood sugars are ranging 100-160.  Her last HgBa1c was done 6 months ago and was 6.1%.  She came in fasting today in anticipation of lab work. She does not have any long term problems with diabetic neuropathy or retinopathy. She does have a history of diabetic with circulatory problems with PAD and CAD. It also looks like she has some diabetic nephropathy with Stage IIIa CKD.  The patient had a dilated eye exam done on 07/09/2023 done by Dr. McCuen at [facility] which there was no diabetic retinopathy.  She states she has some macular degeneration and she is now receiving eye injections.   She also has a history of Stage IIIb CKD  which is probably due to her diabetes and HTN.  Her creatinine has been running 1-1.1 over the last 2 years with a GFR of 32-60.  She is currently on losartan  and farxiga  10mg  daily.  She denies any NSAIDS use.  She seen Bhandari on 10/05/2023 where her kidney function has been stable.  She also had a renal US  done on 10/12/2022 and this showed some cysts and medical renal disease.   The patient is a 89 year old Caucasian/White female who presents for a follow-up evaluation of hypertension.  This past year, her BP was not controlled and we increased her  losartan  from 50mg  daily to 100mg  daily.  The patient has been checking her blood pressure at home. The patient's blood pressure has ranged systollically in the 120-130's range. The patient's current medications include: amlodipine  10mg  daily, Lasix  40mg  every other day, hydralazine  50mg  TID, and losartan  100mg  daily.  The patient has been tolerating her medications well. The patient denies any headache, visual changes, dizziness, lightheadness, chest pain, shortness of breath, weakness/numbness, and edema.   The patient also has a history of atrial fibrillation.  She states she developed her A. Fib with her first MI in 2015 when she was in the hospital.  She is currently on metoprolol  for rate control.  She does have a history of systolic CHF in her notes but her last ECHO was done 12/2016 and this showed a normal LVEF of 55-60%.  There was no regional wall motion abnormalities and she did have grade I diastolic dysfunction.  Today, she denies any heart palpitaitons, SOB, syncope, dizziness, orthopnea or other problems.  She does have a history of CAD and HCL.  Again, she also has a history of CAD where she had a NSTEMI in 2015.  She had CABG x 2 once in 2015 and again in 2016.  She states she has had 2 heart attacks in the past.  er last heart cath was done in 11/2013 that showed a 60% LM; 90% LAD, 50% RCA, 80% d RCA)  Patient underwent PCI to LAD in 2015  Pt then underwent CABG with LIMA to LAD.     In Nov 2015 her heat cath showed; LIMA to apical LAD occluded.  Pt underwent PCI and DES x 2 to RCA.  In Nov 2016 :Urgent redo  CABG x 3.  This past year, her cholesterol was not at goal and we increased her lipitor from 10mg  to 20mg  daily.  However, she states she remains on 10mg  daily She denies any abdominal pain, nausea, vomiting, myalgias or fatigue.  She is currently on lipitor 20mg  daily.  She is not fasting for her labs.  Her last visit with cardiology was on 07/05/2024 and they felt her CAD was  stable.  She also has a history of peripheral vascular disease where she last saw vascular surgery with Dr. Gretta on 05/02/2024.  She previously was under the care of a vascular surgeon in [place] but unfortunately her son died and she is unable to get transportation.  She has an extensive vascular surgery history including remote bilateral iliac stents and SFA stents.  She then later had a right fem to above-knee popliteal bypass with GSV in 2004.  She then had a left common femoral to above-knee popliteal bypass in 2013.  Ultimately this graft had to be removed due to infection and then she had a redo left femoral to BK pop bypass in 2017.  She had a chronic foot drop  on the left and she states this occurred after her CABG years ago.  They did a lower extremity arterial duplex in 04/2023 that showed bilateral lower extremity femoropopliteal bypass patent with no visualized stenosis.  Her ABI's were 0.88 on the right biphasic and 1.02 on the left biphasic.  They felt her bypass grafts were patent by duplex and she had no evidence of significant recurrent stenosis.  She saw them back on 05/02/2024 where her lower extremity arterial duplex showed a right leg bypass with 50 to 70% stenosis at the proximal anastomosis with a velocity of 208 and a widely patent left leg bypass.  Her ABI also done on 05/02/2024 showed 1.05 on the right mulitphasic and 1.26 on the left triphasic.  Her duplex showed that both bypasses remain patent without any critical flow-limiting stenosis.  The inflow velocity in the right leg bypass is really not significantly changed over the last year.  They will continue to follow with interval surveillance yearly.     She is also followed by Oncology with last visit in 02/07/2024 where she has had a history of non-small cell lung cancer.  They felt that her lung cancer was in remission.   She was noted  to have a lung nodule back in 2016 that was being followed.  They did a PET/CT scan for staging  showed both the lung nodule which had increased in size from 9 x 8 mm to 11 x 10 mm and had an FDG avidity of 6.6. There was also pleural nodularity and thickening suspicious for metastatic disease without a pleural effusion. This had maximum SUV of 4.6. The remainder of the abdomen, and musculoskeletal systems was negative. There was a 9 mm sob dermal nodule along the posterior left shoulder which showed some mild uptake with an SUV of 2.  The patient patient was asymptomatic with the exception of a chronic cough.  She  underwent a left VATS with lysis of adhesions on 04/26/2017 with a left pleural biopsy and upper lobe wedge resection.  Pathology of the left upper lobe nodule showed an adenocarcinoma, acinar predominant. Tumor was TTF-1-1 positive and moderately differentiated 1.5 x 0.8 cm, unifocal, with visceral pleural invasion. The parietal pleura was also involved. There was focal TPF-1 reactivity in fibroadipose tissue which supported subparietal pleural invasion by lung adenocarcinoma.  A separate pleural biopsy showed focal invasive lung adenocarcinoma involving fibroadipose tissue. There was no evidence of metastatic melanoma. Another pleural biopsy just showed focal subpleural fibrosis with focal atypia nondiagnostic for carcinoma.  Staging was pT3NxM0.  They found she had a melanoma on her shoulder and she underwent  local excision of upper limb shoulder left side melanoma on 06/02/2017.  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 It invaded the subcutis. There is no evidence of ulceration. Margins were negative, mitotic rate greater than 1/mm. No evidence of microsatellitosis, lymphovascular invasion or perineural invasion. There was brisk tumor infiltrating lymphocytes.  She started keytruda in 06/16/2017 and completed  5 cycles of Keytruda, last on 09/2017. 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  They have been doing Ct scans and her last CT of the abdomen and pelvis  was unremarkable. There was a 9 mm groundglass nodule in the upper lobe of the right lung which was unchanged as well as some scarring in the lower lobe of the lung. She is currently in long-term remission.   The patient also has a history of bladder  cancer which was discovered in 2023.  Her bladder tumors-noninvasive-patient went for cystoscopic evaluation biopsy and fulguration on 12/11/2021. The bladder tumors were seen the largest was on the anterior bladder wall near the bladder neck the second which is to the left. These were biopsied and then the entire region was fulgurated approximately 3 cm in total space.  Pathology showed noninvasive papillary urothelial carcinoma, low-grade lamina propria was present negative for tumor, muscularis propria with absent, lymphovascular invasion was identified carcinoma in situ was also not identified. Less than 5% had high-grade features of the tumor.  She had followup cystoscopy in summer/2023 per patient's report was negative.  She went back to urology for cystocopy in 10/2023 and they are seeing her every 6 months.     The patient also has a history of depression which she states has been going on for years and initially started about 20-25 years ago.  She states her depression is mild and controlled.  She is currently on Wellbutrin  XL 150mg  daily and Effexor  XR 75mg  daily and she takes trazodone  at night for sleep.  She states she is sleeping well.  She denies any SI/HI.  She states her depression is controlled and she denies any anxiety.      Are there smokers in your home (other than you)? No  Risk Factors Current exercise habits: As above  Dietary issues discussed: none   Depression Screen (Note: if answer to either of the following is Yes, a more complete depression screening is indicated)   Over the past two weeks, have you felt down, depressed or hopeless? No  Over the past two weeks, have you felt little interest or pleasure in doing things?  No  Have you lost interest or pleasure in daily life? No  Do you often feel hopeless? No  Do you cry easily over simple problems? No  Activities of Daily Living In your present state of health, do you have any difficulty performing the following activities?:  Driving? Yes Managing money?  No Feeding yourself? No Getting from bed to chair? No Climbing a flight of stairs? No Preparing food and eating?: No Bathing or showering? No Getting dressed: No Getting to the toilet? No Using the toilet:No Moving around from place to place: Yes- she has a motorized wheelchair where she can transfer herself from the chair to bed or toilet.  She cannot walk In the past year have you fallen or had a near fall?:Yes     Hearing Difficulties: Yes- wears hearing aids Do you often ask people to speak up or repeat themselves? Yes Do you experience ringing or noises in your ears? No Do you have difficulty understanding soft or whispered voices? Yes   Do you feel that you have a problem with memory? No  Do you often misplace items? No  Do you feel safe at home?  Yes  Cognitive Testing  Alert? Yes  Normal Appearance?Yes  Oriented to person? Yes  Place? Yes   Time? Yes  Recall of three objects?  Yes  Can perform simple calculations? Yes  Displays appropriate judgment?Yes  Can read the correct time from a watch face?Yes  Fall Risk Prevention  Any stairs in or around the home? Yes  If so, are there any without handrails? Yes  Home free of loose throw rugs in walkways, pet beds, electrical cords, etc? Yes  Adequate lighting in your home to reduce risk of falls? Yes  Use of a cane, walker or w/c? Yes  Time Up and Go  Was the test performed? No .      Advanced Directives have been discussed with the patient? Yes   List the Names of Other Physician/Practitioners you currently use: Patient Care Team: Fleeta Valeria Mayo, MD as PCP - General (Internal Medicine) Okey Vina GAILS, MD as PCP -  Cardiology (Cardiology)    Past Medical History:  Diagnosis Date   Anemia    Anesthesia complication    Arthritis    Breast cancer (HCC)    Right Breast- 20 yrs ago   CAD (coronary artery disease)    CHF (congestive heart failure) (HCC)    Colon polyp    Constipation    Depression    Esophageal reflux    Fever    Foot drop    High cholesterol    HTN (hypertension)    Hyperlipemia    Hypertension    Lung cancer (HCC)    Melanoma (HCC)    MI (myocardial infarction) (HCC)    Paroxysmal atrial fibrillation (HCC)    Peripheral arterial disease    Skin ulcer of chin    SOB (shortness of breath)    Syncope and collapse    Uses hearing aid    Vertigo    Wears partial dentures     Past Surgical History:  Procedure Laterality Date   ABDOMINAL HYSTERECTOMY     BREAST BIOPSY     BREAST CYST EXCISION     BREAST LUMPECTOMY Right    20 yrs ago   cardiac stents     CARDIAC SURGERY     CATARACT EXTRACTION        Current Medications  Current Outpatient Medications  Medication Sig Dispense Refill   acetaminophen  (TYLENOL ) 325 MG tablet Take 325 mg by mouth every 6 (six) hours as needed for moderate pain.     ADMELOG SOLOSTAR 100 UNIT/ML KwikPen INJECT 18 UNITS SUBQ FOR SUGAR 150, 22 UNITS FOR SUGAR 151-200, 26 UNITS FOR 201-250, 30 UNITS FOR 251-300 THREE TIMES A DAY *EXPIRES 28 DAYS AFTER OPENING* 30 mL 10   amLODipine  (NORVASC ) 10 MG tablet TAKE 1 TABLET BY MOUTH EVERY EVENING FOR BLOOD PRESSURE 90 tablet 2   aspirin  81 MG chewable tablet Chew 81 mg by mouth every evening.     atorvastatin  (LIPITOR) 10 MG tablet Take 2 tablets (20 mg total) by mouth daily. for cholesterol. (Patient taking differently: Take 20 mg by mouth daily. for cholesterol. Patient taking 40 mg daily(two 20 mg tablets)) 90 tablet 2   buPROPion  (WELLBUTRIN  XL) 150 MG 24 hr tablet Take 1 tablet (150 mg total) by mouth daily. 90 tablet 2   Cholecalciferol  (VITAMIN D ) 50 MCG (2000 UT) tablet Take 2,000 Units by  mouth daily.     Cholecalciferol  125 MCG (5000 UT) TABS Take 1 tablet (5,000 Units total) by mouth 3 (three) times a week.     Cyanocobalamin (VITAMIN B 12 PO) Take 1 tablet by mouth daily.     faricimab-svoa (VABYSMO) 6 MG/0.05ML SOLN intravitreal injection 6 mg by Intravitreal route daily.     FARXIGA  10 MG TABS tablet TAKE 1 TABLET BY MOUTH EVERY MORNING 30 tablet 10   fluticasone  (FLONASE ) 50 MCG/ACT nasal spray Place 1 spray into both nostrils in the morning and at bedtime. 15.8 mL 0   furosemide  (LASIX ) 40 MG tablet TAKE 1 TABLET BY MOUTH EVERY OTHER DAY WITH POTASSIUM 45 tablet 2   hydrALAZINE  (APRESOLINE ) 50 MG tablet TAKE 1 TABLET BY  MOUTH THREE TIMES A DAY WITH FOOD 270 tablet 2   insulin  aspart (NOVOLOG ) 100 UNIT/ML injection Inject 0-15 Units into the skin 3 (three) times daily with meals.     Insulin  Glargine (BASAGLAR  KWIKPEN) 100 UNIT/ML Inject 20 Units into the skin daily. 3 mL 1   losartan  (COZAAR ) 100 MG tablet TAKE 1 TABLET BY MOUTH ONCE DAILY 90 tablet 2   Magnesium  250 MG TABS Take by mouth daily.     metoprolol  succinate (TOPROL -XL) 50 MG 24 hr tablet TAKE 1 & 1/2 TABLET = 75 MG BY MOUTH ONCE DAILY 135 tablet 10   Multiple Vitamins-Minerals (PRESERVISION AREDS) CAPS Take 1 capsule by mouth in the morning and at bedtime.     omeprazole (PRILOSEC) 20 MG capsule TAKE 1 CAPSULE BY MOUTH EVERY MORNING, 30 MINUTES BEFORE MORNING MEAL *DO NOT CRUSH OR CHEW* 90 capsule 10   potassium chloride  (KLOR-CON  M) 10 MEQ tablet TAKE 1 TABLET BY MOUTH EVERY OTHER DAY WITH LASIX  *TAKE WITH FOOD* *DO NOT CRUSH OR CHEW* *MAY DISSOLVE* 45 tablet 3   traZODone  (DESYREL ) 50 MG tablet TAKE 1/2 TABLET= 25 MG BY MOUTH EVERY NIGHT AT BEDTIME 45 tablet 10   venlafaxine  XR (EFFEXOR -XR) 75 MG 24 hr capsule TAKE 1 CAPSULE BY MOUTH ONCE DAILY *TAKE WITH FOOD* *DO NOT CRUSH OR CHEW* 90 capsule 2   No current facility-administered medications for this visit.    Allergies Codeine, Latex, Losartan , and  Metformin hcl   Social History Social History   Tobacco Use   Smoking status: Former   Smokeless tobacco: Never  Substance Use Topics   Alcohol use: Yes    Alcohol/week: 2.0 standard drinks of alcohol    Types: 2 Glasses of wine per week    Comment: sometimes     Review of Systems Review of Systems  Constitutional:  Negative for chills, fever, malaise/fatigue and weight loss.  Respiratory:  Negative for cough, hemoptysis, shortness of breath and wheezing.   Cardiovascular:  Negative for chest pain, palpitations and leg swelling.  Gastrointestinal:  Negative for abdominal pain, blood in stool, constipation, diarrhea, heartburn, melena, nausea and vomiting.  Genitourinary:  Negative for frequency and hematuria.  Musculoskeletal:  Negative for myalgias.  Skin:  Negative for itching and rash.  Neurological:  Negative for dizziness, weakness and headaches.  Endo/Heme/Allergies:  Negative for polydipsia.  Psychiatric/Behavioral:  Negative for memory loss. The patient is not nervous/anxious and does not have insomnia.      Physical Exam:      Body mass index is 26.15 kg/m. BP 126/70   Pulse 67   Temp 98.2 F (36.8 C)   Resp 18   Ht 5' 5 (1.651 m)   Wt 157 lb 2 oz (71.3 kg)   SpO2 93%   BMI 26.15 kg/m   Physical Exam Constitutional:      Appearance: Normal appearance. She is not ill-appearing.  HENT:     Head: Normocephalic and atraumatic.     Right Ear: Tympanic membrane, ear canal and external ear normal.     Left Ear: Tympanic membrane, ear canal and external ear normal.     Nose: Nose normal. No congestion or rhinorrhea.     Mouth/Throat:     Mouth: Mucous membranes are moist.     Pharynx: Oropharynx is clear. No posterior oropharyngeal erythema.  Eyes:     General: No scleral icterus.    Conjunctiva/sclera: Conjunctivae normal.     Pupils: Pupils are equal, round, and reactive  to light.  Neck:     Thyroid : No thyromegaly.     Vascular: No carotid bruit.   Cardiovascular:     Rate and Rhythm: Normal rate and regular rhythm.     Pulses: Normal pulses.     Heart sounds: Normal heart sounds. No murmur heard.    No friction rub. No gallop.  Pulmonary:     Effort: Pulmonary effort is normal. No respiratory distress.     Breath sounds: Normal breath sounds. No wheezing, rhonchi or rales.  Abdominal:     General: Abdomen is flat. Bowel sounds are normal. There is no distension.     Palpations: Abdomen is soft.     Tenderness: There is no abdominal tenderness.  Musculoskeletal:     Cervical back: Normal range of motion. No tenderness.     Right lower leg: No edema.     Left lower leg: No edema.     Comments: No clubbing or cyanosis  Lymphadenopathy:     Cervical: No cervical adenopathy.  Skin:    General: Skin is warm and dry.     Findings: No rash.  Neurological:     General: No focal deficit present.     Mental Status: She is alert and oriented to person, place, and time.     Comments: CN II-XII grossly intact  Psychiatric:        Mood and Affect: Mood normal.        Behavior: Behavior normal.      Assessment:      Diabetic nephropathy associated with type 2 diabetes mellitus (HCC)  Type 2 diabetes mellitus with other circulatory complication, with long-term current use of insulin  (HCC)  Stage 3b chronic kidney disease (HCC)  Mixed hyperlipidemia  Essential hypertension  Coronary artery disease involving coronary bypass graft of native heart without angina pectoris  PAD (peripheral artery disease)  Atrial fibrillation, unspecified type (HCC)  History of lung cancer  History of bladder cancer  Mild episode of recurrent major depressive disorder  Chronic diastolic congestive heart failure (HCC)  Atherosclerosis of native artery of both lower extremities, with unspecified presence of clinical manifestation  Gastroesophageal reflux disease, unspecified whether esophagitis present  Diabetic polyneuropathy  associated with type 2 diabetes mellitus (HCC)  BMI 26.0-26.9,adult  Abnormal thyroid  blood test    Plan:     During the course of the visit the patient was educated and counseled about appropriate screening and preventive services including:   Pneumococcal vaccine  Influenza vaccine Screening mammography Colorectal cancer screening Advanced directives: DNI/DNR  Diet review for nutrition referral? Yes ____  Not Indicated _X___   Patient Instructions (the written plan) was given to the patient.  Chronic diastolic congestive heart failure (HCC) Her last ECHO in 2018 showed a normal LVEF but she had grade I diastolic dysfunction.  She shows no signs of volume overload today.  Atherosclerosis of native artery of extremity She is seen every 6 months by vascular surgery where she has had multiple LE bypasses.  Her bypasses on last visit were patient where they did a lower extremity duplex and ABI's.  We will continue with risk factor modification and she is on an ASA.  She denies any claudication symptoms.   Coronary artery disease She has CAD with history of MI and history of CABG. We will continue with secondary prevention. She is on a statin and ASA. She denies any angina at this time.   PAD (peripheral artery disease) Plan as below.  Essential  hypertension Her BP is doing well.  We will conitnue her current BP medications.  Atrial fibrillation (HCC) She is currently rate controlled.  She is on an ASA 81mg  daily.  Gastroesophageal reflux disease Her omeprazole controls her reflux.  Diabetic polyneuropathy associated with type 2 diabetes mellitus (HCC) This is currently stable.  Her diabetic foot exam was normal.  Diabetic nephropathy associated with type 2 diabetes mellitus (HCC) She is going to see nephrology soon.  We will continue control of her diabetes and HTN,  She will continue on farxiga  and avoid NSAIDS.  Stage 3b chronic kidney disease (HCC) She goes back to  see nephrology next week.  Hyperlipidemia Her goal LDL <55 with her history of diabetes, PVD, and CAD.  History of lung cancer She is followed by oncology who does regular surveillance scans.  She remains in remission.  History of bladder cancer She will continue to follow with urolgy for surveillance.  She remains in remission at this time.  Depression She has mild recurrent major depression that is mild and controlled today.  BMI 26.0-26.9,adult I want her to eat healthy, exercise and keep active,  Prevention Health maintenance was discussed.  She needs a repeat eye exam and mammogram.  We will obtain some yearly labs.    Medicare Attestation I have personally reviewed: The patient's medical and social history Their use of alcohol, tobacco or illicit drugs Their current medications and supplements The patient's functional ability including ADLs,fall risks, home safety risks, cognitive, and hearing and visual impairment Diet and physical activities Evidence for depression or mood disorders  The patient's weight, height, and BMI have been recorded in the chart.  I have made referrals, counseling, and provided education to the patient based on review of the above and I have provided the patient with a written personalized care plan for preventive services.     Selinda Fleeta Finger, MD   07/20/2024

## 2024-07-20 NOTE — Assessment & Plan Note (Signed)
 She is followed by oncology who does regular surveillance scans.  She remains in remission.

## 2024-07-20 NOTE — Assessment & Plan Note (Signed)
 She is seen every 6 months by vascular surgery where she has had multiple LE bypasses.  Her bypasses on last visit were patient where they did a lower extremity duplex and ABI's.  We will continue with risk factor modification and she is on an ASA.  She denies any claudication symptoms.

## 2024-07-20 NOTE — Assessment & Plan Note (Signed)
 Her BP is doing well.  We will conitnue her current BP medications.

## 2024-07-20 NOTE — Assessment & Plan Note (Signed)
 She is going to see nephrology soon.  We will continue control of her diabetes and HTN,  She will continue on farxiga  and avoid NSAIDS.

## 2024-07-20 NOTE — Assessment & Plan Note (Signed)
 I want her to eat healthy, exercise and keep active,

## 2024-07-20 NOTE — Assessment & Plan Note (Signed)
 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  We will continue with secondary prevention.  She is on a statin and ASA.  She denies any angina at this time.

## 2024-07-20 NOTE — Assessment & Plan Note (Signed)
 She is currently rate controlled.  She is on an ASA 81mg  daily.

## 2024-07-20 NOTE — Assessment & Plan Note (Signed)
 She will continue to follow with urolgy for surveillance.  She remains in remission at this time.

## 2024-07-20 NOTE — Assessment & Plan Note (Signed)
 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

## 2024-07-20 NOTE — Assessment & Plan Note (Signed)
 She goes back to see nephrology next week.

## 2024-07-21 LAB — LIPID PANEL W/O CHOL/HDL RATIO
Cholesterol, Total: 141 mg/dL (ref 100–199)
HDL: 62 mg/dL (ref >39)
LDL Chol Calc (NIH): 57 mg/dL (ref 0–99)
Triglycerides: 123 mg/dL (ref 0–149)
VLDL Cholesterol Cal: 22 mg/dL (ref 5–40)

## 2024-07-21 LAB — HGB A1C W/O EAG: Hgb A1c MFr Bld: 6.6 % — ABNORMAL HIGH (ref 4.8–5.6)

## 2024-07-21 LAB — T4, FREE: Free T4: 1.42 ng/dL (ref 0.82–1.77)

## 2024-07-21 LAB — TSH: TSH: 1.55 u[IU]/mL (ref 0.450–4.500)

## 2024-07-24 ENCOUNTER — Other Ambulatory Visit: Payer: Self-pay | Admitting: Internal Medicine

## 2024-08-02 ENCOUNTER — Other Ambulatory Visit: Payer: Self-pay | Admitting: Internal Medicine

## 2024-08-02 DIAGNOSIS — Z1231 Encounter for screening mammogram for malignant neoplasm of breast: Secondary | ICD-10-CM

## 2024-08-08 ENCOUNTER — Other Ambulatory Visit: Payer: Self-pay | Admitting: Internal Medicine

## 2024-08-15 ENCOUNTER — Ambulatory Visit: Payer: Self-pay

## 2024-08-15 NOTE — Progress Notes (Signed)
 Patient called.  Patient aware.  Per Dr. Fleeta Finger Her labs look good with diabetes and cholesterol controlled. Catherine Andrews

## 2024-09-12 ENCOUNTER — Inpatient Hospital Stay: Admission: RE | Admit: 2024-09-12 | Discharge: 2024-09-12 | Attending: Internal Medicine | Admitting: Internal Medicine

## 2024-09-12 DIAGNOSIS — Z1231 Encounter for screening mammogram for malignant neoplasm of breast: Secondary | ICD-10-CM

## 2024-10-13 ENCOUNTER — Emergency Department (HOSPITAL_COMMUNITY)
Admission: EM | Admit: 2024-10-13 | Discharge: 2024-10-13 | Disposition: A | Discharge status: Home / Self Care | Source: Home / Self Care | Attending: Emergency Medicine | Admitting: Emergency Medicine

## 2024-10-13 ENCOUNTER — Encounter (HOSPITAL_COMMUNITY): Payer: Self-pay

## 2024-10-13 ENCOUNTER — Other Ambulatory Visit: Payer: Self-pay

## 2024-10-13 DIAGNOSIS — E162 Hypoglycemia, unspecified: Secondary | ICD-10-CM

## 2024-10-13 LAB — CBG MONITORING, ED
Glucose-Capillary: 171 mg/dL — ABNORMAL HIGH (ref 70–99)
Glucose-Capillary: 220 mg/dL — ABNORMAL HIGH (ref 70–99)
Glucose-Capillary: 217 mg/dL — ABNORMAL HIGH (ref 70–99)

## 2024-10-13 NOTE — ED Provider Notes (Signed)
 " [place] EM[facility]GENCY DEPARTMENT AT [facility] Provider Note   CSN: [id number] Arrival date & time: 10/13/24  1728     Patient presents with: Hypoglycemia   Catherine Andrews is a 89 y.o. female.   This is a 30-year-old female who presents with hyperglycemia.  Has a history of diabetes.  She is on insulin .  Patient states that her diet has not been the best.  She denies any recent illness.  Facility called EMS due to patient's blood sugar being 50.  Was given Coke, candy and juice.  CBG per EMS was 180.  She was alert and oriented x 4.  She has no complaints at this time       Prior to Admission medications  Medication Sig Start Date End Date Taking? Authorizing Provider  acetaminophen  (TYLENOL ) 325 MG tablet Take 325 mg by mouth every 6 (six) hours as needed for moderate pain. 11/15/17   [provider]  ADMELOG SOLOSTAR 100 UNIT/ML KwikPen INJECT 18 UNITS SUBQ FOR SUGAR 150, 22 UNITS FOR SUGAR 151-200, 26 UNITS FOR 201-250, 30 UNITS FOR 251-300 THREE TIMES A DAY *EXPIRES 28 DAYS AFTER OPENING* 06/27/24   Caleen Dirks, MD  amLODipine  (NORVASC ) 10 MG tablet TAKE 1 TABLET BY MOUTH EVERY EVENING FOR BLOOD PRESSURE 11/30/23   Fleeta Finger, Selinda, MD  aspirin  81 MG chewable tablet Chew 81 mg by mouth every evening.    [provider]  atorvastatin  (LIPITOR) 10 MG tablet Take 2 tablets (20 mg total) by mouth daily. for cholesterol. Patient taking differently: Take 20 mg by mouth daily. for cholesterol. Patient taking 40 mg daily(two 20 mg tablets) 02/22/24   Fleeta Finger, Selinda, MD  buPROPion  (WELLBUTRIN  XL) 150 MG 24 hr tablet Take 1 tablet (150 mg total) by mouth daily. 10/28/23   Amin, Saad, MD  Cholecalciferol  (VITAMIN D ) 50 MCG (2000 UT) tablet Take 2,000 Units by mouth daily.    [provider]  Cholecalciferol  125 MCG (5000 UT) TABS Take 1 tablet (5,000 Units total) by mouth 3 (three) times a week. 05/26/24   Hongalgi, Anand D, MD  Cyanocobalamin  (VITAMIN B 12 PO)  Take 1 tablet by mouth daily.    [provider]  faricimab-svoa (VABYSMO) 6 MG/0.05ML SOLN intravitreal injection 6 mg by Intravitreal route daily.    [provider]  FARXIGA  10 MG TABS tablet TAKE 1 TABLET BY MOUTH EVERY MORNING 11/30/23   Fleeta Finger, Selinda, MD  fluticasone  (FLONASE ) 50 MCG/ACT nasal spray Place 1 spray into both nostrils in the morning and at bedtime. 10/14/23 10/13/24  Fleeta Finger Selinda, MD  furosemide  (LASIX ) 40 MG tablet TAKE 1 TABLET BY MOUTH EVERY OTHER DAY WITH POTASSIUM 07/26/24   Okey Vina GAILS, MD  hydrALAZINE  (APRESOLINE ) 50 MG tablet TAKE 1 TABLET BY MOUTH THREE TIMES A DAY WITH FOOD 09/20/23   Caleen Dirks, MD  insulin  aspart (NOVOLOG ) 100 UNIT/ML injection Inject 0-15 Units into the skin 3 (three) times daily with meals. 06/03/24   Barbarann Nest, MD  Insulin  Glargine (BASAGLAR  [facility]) 100 UNIT/ML Inject 20 Units into the skin daily. 06/27/24   Amin, Saad, MD  losartan  (COZAAR ) 100 MG tablet TAKE 1 TABLET BY MOUTH ONCE DAILY 06/27/24   Amin, Saad, MD  Magnesium  250 MG TABS Take by mouth daily.    [provider]  metoprolol  succinate (TOPROL -XL) 50 MG 24 hr tablet TAKE 1 & 1/2 TABLET = 75 MG BY MOUTH ONCE DAILY 04/04/24   Fleeta Finger, Selinda,  MD  Multiple Vitamins-Minerals (PRESERVISION AREDS) CAPS Take 1 capsule by mouth in the morning and at bedtime.    [provider]  omeprazole (PRILOSEC) 20 MG capsule TAKE 1 CAPSULE BY MOUTH EVERY MORNING, 30 MINUTES BEFORE MORNING MEAL *DO NOT CRUSH OR CHEW* 04/04/24   Fleeta Finger, Selinda, MD  potassium chloride  (KLOR-CON  M) 10 MEQ tablet TAKE 1 TABLET BY MOUTH EVERY OTHER DAY WITH LASIX  *TAKE WITH FOOD* *DO NOT CRUSH OR CHEW* *MAY DISSOLVE* 01/25/24   Okey Vina GAILS, MD  traZODone  (DESYREL ) 50 MG tablet TAKE 1/2 TABLET= 25 MG BY MOUTH EVERY NIGHT AT BEDTIME 03/27/24   Fleeta Finger Selinda, MD  venlafaxine  XR (EFFEXOR -XR) 75 MG 24 hr capsule TAKE 1 CAPSULE BY MOUTH ONCE DAILY *TAKE WITH FOOD* *DO NOT CRUSH OR CHEW* 11/30/23    Fleeta Finger Selinda, MD    Allergies: Codeine, Latex, Losartan , and Metformin hcl    Review of Systems  All other systems reviewed and are negative.   Updated Vital Signs BP (!) 184/58 (BP Location: Left Arm)   Pulse 65   Resp 18   SpO2 96%   Physical Exam Vitals and nursing note reviewed.  Constitutional:      General: She is not in acute distress.    Appearance: Normal appearance. She is well-developed. She is not toxic-appearing.  HENT:     Head: Normocephalic and atraumatic.  Eyes:     General: Lids are normal.     Conjunctiva/sclera: Conjunctivae normal.     Pupils: Pupils are equal, round, and reactive to light.  Neck:     Thyroid : No thyroid  mass.     Trachea: No tracheal deviation.  Cardiovascular:     Rate and Rhythm: Normal rate and regular rhythm.     Heart sounds: Normal heart sounds. No murmur heard.    No gallop.  Pulmonary:     Effort: Pulmonary effort is normal. No respiratory distress.     Breath sounds: Normal breath sounds. No stridor. No decreased breath sounds, wheezing, rhonchi or rales.  Abdominal:     General: There is no distension.     Palpations: Abdomen is soft.     Tenderness: There is no abdominal tenderness. There is no rebound.  Musculoskeletal:        General: No tenderness. Normal range of motion.     Cervical back: Normal range of motion and neck supple.  Skin:    General: Skin is warm and dry.     Findings: No abrasion or rash.  Neurological:     Mental Status: She is alert and oriented to person, place, and time. Mental status is at baseline.     GCS: GCS eye subscore is 4. GCS verbal subscore is 5. GCS motor subscore is 6.     Cranial Nerves: No cranial nerve deficit.     Sensory: No sensory deficit.     Motor: Motor function is intact.  Psychiatric:        Attention and Perception: Attention normal.        Speech: Speech normal.        Behavior: Behavior normal.     (all labs ordered are listed, but only abnormal results are  displayed) Labs Reviewed  CBG MONITORING, ED - Abnormal; Notable for the following components:      Result Value   Glucose-Capillary 171 (*)    All other components within normal limits    EKG: None  Radiology: No results found.   Procedures  Medications Ordered in the ED - No data to display                                  Medical Decision Making  Patient monitored here for several hours and blood sugar has been stable.  Suspect that the etiology of her hyperglycemia is from her dietary intake.  Daughter at bedside and informed her of plan that patient needs to eat more regular meals.  She agrees.  Patient will be discharged home     Final diagnoses:  None    ED Discharge Orders     None          Dasie Faden, MD 10/13/24 2102  "

## 2024-10-13 NOTE — ED Triage Notes (Signed)
 Pt BIB EMS from [facility] due to Hypoglycemia. Pt CBG 50 at facility, pt given coke, candies, and juice. EMS reports CBG of 180 AAOx4. One arrival to ED pt CBG 171, AAOx4. Facility and family is concerned why pt CBG keep dropping, Hx of hypoglycemia, unclear why per facility.

## 2025-01-04 ENCOUNTER — Ambulatory Visit: Admitting: Internal Medicine
